# Patient Record
Sex: Female | Born: 1945
Health system: Southern US, Community
[De-identification: ages and names within clinical notes are randomized; demographics above are authoritative.]

## PROBLEM LIST (undated history)

## (undated) DIAGNOSIS — E039 Hypothyroidism, unspecified: Secondary | ICD-10-CM

## (undated) DIAGNOSIS — F419 Anxiety disorder, unspecified: Secondary | ICD-10-CM

## (undated) DIAGNOSIS — K222 Esophageal obstruction: Secondary | ICD-10-CM

## (undated) DIAGNOSIS — K5790 Diverticulosis of intestine, part unspecified, without perforation or abscess without bleeding: Secondary | ICD-10-CM

## (undated) DIAGNOSIS — M810 Age-related osteoporosis without current pathological fracture: Secondary | ICD-10-CM

## (undated) DIAGNOSIS — K635 Polyp of colon: Secondary | ICD-10-CM

## (undated) DIAGNOSIS — H269 Unspecified cataract: Secondary | ICD-10-CM

## (undated) DIAGNOSIS — T7840XA Allergy, unspecified, initial encounter: Secondary | ICD-10-CM

## (undated) DIAGNOSIS — E785 Hyperlipidemia, unspecified: Secondary | ICD-10-CM

## (undated) DIAGNOSIS — M8580 Other specified disorders of bone density and structure, unspecified site: Secondary | ICD-10-CM

## (undated) DIAGNOSIS — K209 Esophagitis, unspecified without bleeding: Secondary | ICD-10-CM

## (undated) DIAGNOSIS — Z9109 Other allergy status, other than to drugs and biological substances: Secondary | ICD-10-CM

## (undated) DIAGNOSIS — G4730 Sleep apnea, unspecified: Secondary | ICD-10-CM

## (undated) DIAGNOSIS — F329 Major depressive disorder, single episode, unspecified: Secondary | ICD-10-CM

## (undated) DIAGNOSIS — K449 Diaphragmatic hernia without obstruction or gangrene: Secondary | ICD-10-CM

## (undated) DIAGNOSIS — E559 Vitamin D deficiency, unspecified: Secondary | ICD-10-CM

## (undated) DIAGNOSIS — K589 Irritable bowel syndrome without diarrhea: Secondary | ICD-10-CM

## (undated) DIAGNOSIS — K219 Gastro-esophageal reflux disease without esophagitis: Secondary | ICD-10-CM

## (undated) DIAGNOSIS — E538 Deficiency of other specified B group vitamins: Secondary | ICD-10-CM

## (undated) DIAGNOSIS — K200 Eosinophilic esophagitis: Secondary | ICD-10-CM

## (undated) DIAGNOSIS — W19XXXA Unspecified fall, initial encounter: Secondary | ICD-10-CM

## (undated) DIAGNOSIS — J45909 Unspecified asthma, uncomplicated: Secondary | ICD-10-CM

## (undated) DIAGNOSIS — C4490 Unspecified malignant neoplasm of skin, unspecified: Secondary | ICD-10-CM

## (undated) DIAGNOSIS — F32A Depression, unspecified: Secondary | ICD-10-CM

## (undated) DIAGNOSIS — N289 Disorder of kidney and ureter, unspecified: Secondary | ICD-10-CM

## (undated) HISTORY — PX: OTHER SURGICAL HISTORY: SHX169

## (undated) HISTORY — DX: Allergy, unspecified, initial encounter: T78.40XA

## (undated) HISTORY — DX: Unspecified fall, initial encounter: W19.XXXA

## (undated) HISTORY — DX: Unspecified malignant neoplasm of skin, unspecified: C44.90

## (undated) HISTORY — DX: Disorder of kidney and ureter, unspecified: N28.9

## (undated) HISTORY — DX: Esophagitis, unspecified: K20.9

## (undated) HISTORY — PX: BARTHOLIN GLAND CYST EXCISION: SHX565

## (undated) HISTORY — DX: Irritable bowel syndrome, unspecified: K58.9

## (undated) HISTORY — DX: Gastro-esophageal reflux disease without esophagitis: K21.9

## (undated) HISTORY — PX: EXPLORATORY LAPAROTOMY: SUR591

## (undated) HISTORY — PX: BUNIONECTOMY: SHX129

## (undated) HISTORY — PX: ABDOMINAL HYSTERECTOMY: SHX81

## (undated) HISTORY — DX: Major depressive disorder, single episode, unspecified: F32.9

## (undated) HISTORY — DX: Polyp of colon: K63.5

## (undated) HISTORY — DX: Depression, unspecified: F32.A

## (undated) HISTORY — DX: Unspecified cataract: H26.9

## (undated) HISTORY — DX: Sleep apnea, unspecified: G47.30

## (undated) HISTORY — DX: Eosinophilic esophagitis: K20.0

## (undated) HISTORY — DX: Deficiency of other specified B group vitamins: E53.8

## (undated) HISTORY — DX: Diverticulosis of intestine, part unspecified, without perforation or abscess without bleeding: K57.90

## (undated) HISTORY — DX: Other specified disorders of bone density and structure, unspecified site: M85.80

## (undated) HISTORY — DX: Anxiety disorder, unspecified: F41.9

## (undated) HISTORY — DX: Esophageal obstruction: K22.2

## (undated) HISTORY — PX: CHOLECYSTECTOMY: SHX55

## (undated) HISTORY — PX: NASAL SEPTUM SURGERY: SHX37

## (undated) HISTORY — DX: Vitamin D deficiency, unspecified: E55.9

## (undated) HISTORY — DX: Esophagitis, unspecified without bleeding: K20.90

## (undated) HISTORY — DX: Age-related osteoporosis without current pathological fracture: M81.0

---

## 1997-10-11 ENCOUNTER — Other Ambulatory Visit: Admission: RE | Admit: 1997-10-11 | Discharge: 1997-10-11 | Payer: Self-pay | Admitting: Gynecology

## 1997-10-31 ENCOUNTER — Encounter: Admission: RE | Admit: 1997-10-31 | Discharge: 1998-01-29 | Payer: Self-pay | Admitting: Gynecology

## 1997-12-25 ENCOUNTER — Emergency Department (HOSPITAL_COMMUNITY): Admission: EM | Admit: 1997-12-25 | Discharge: 1997-12-25 | Payer: Self-pay | Admitting: Emergency Medicine

## 1998-11-06 ENCOUNTER — Other Ambulatory Visit: Admission: RE | Admit: 1998-11-06 | Discharge: 1998-11-06 | Payer: Self-pay | Admitting: Gynecology

## 2000-04-06 ENCOUNTER — Other Ambulatory Visit: Admission: RE | Admit: 2000-04-06 | Discharge: 2000-04-06 | Payer: Self-pay | Admitting: Gynecology

## 2000-05-11 ENCOUNTER — Ambulatory Visit (HOSPITAL_COMMUNITY): Admission: RE | Admit: 2000-05-11 | Discharge: 2000-05-11 | Payer: Self-pay | Admitting: Gastroenterology

## 2000-05-11 ENCOUNTER — Encounter (INDEPENDENT_AMBULATORY_CARE_PROVIDER_SITE_OTHER): Payer: Self-pay | Admitting: Specialist

## 2000-05-28 ENCOUNTER — Encounter: Admission: RE | Admit: 2000-05-28 | Discharge: 2000-05-28 | Payer: Self-pay | Admitting: Gastroenterology

## 2000-05-28 ENCOUNTER — Encounter: Payer: Self-pay | Admitting: Gastroenterology

## 2001-04-15 ENCOUNTER — Other Ambulatory Visit: Admission: RE | Admit: 2001-04-15 | Discharge: 2001-04-15 | Payer: Self-pay | Admitting: Gynecology

## 2001-06-07 ENCOUNTER — Ambulatory Visit (HOSPITAL_BASED_OUTPATIENT_CLINIC_OR_DEPARTMENT_OTHER): Admission: RE | Admit: 2001-06-07 | Discharge: 2001-06-07 | Payer: Self-pay | Admitting: Internal Medicine

## 2001-07-13 ENCOUNTER — Other Ambulatory Visit: Admission: RE | Admit: 2001-07-13 | Discharge: 2001-07-13 | Payer: Self-pay | Admitting: Gynecology

## 2001-07-27 ENCOUNTER — Ambulatory Visit (HOSPITAL_BASED_OUTPATIENT_CLINIC_OR_DEPARTMENT_OTHER): Admission: RE | Admit: 2001-07-27 | Discharge: 2001-07-27 | Payer: Self-pay | Admitting: Internal Medicine

## 2001-09-20 ENCOUNTER — Encounter: Payer: Self-pay | Admitting: Family Medicine

## 2001-09-20 ENCOUNTER — Encounter: Admission: RE | Admit: 2001-09-20 | Discharge: 2001-09-20 | Payer: Self-pay | Admitting: Family Medicine

## 2001-10-11 ENCOUNTER — Ambulatory Visit (HOSPITAL_COMMUNITY): Admission: RE | Admit: 2001-10-11 | Discharge: 2001-10-11 | Payer: Self-pay | Admitting: Gastroenterology

## 2002-04-02 ENCOUNTER — Emergency Department (HOSPITAL_COMMUNITY): Admission: EM | Admit: 2002-04-02 | Discharge: 2002-04-02 | Payer: Self-pay | Admitting: Emergency Medicine

## 2002-12-17 ENCOUNTER — Emergency Department (HOSPITAL_COMMUNITY): Admission: EM | Admit: 2002-12-17 | Discharge: 2002-12-17 | Payer: Self-pay | Admitting: Emergency Medicine

## 2003-08-22 ENCOUNTER — Other Ambulatory Visit: Admission: RE | Admit: 2003-08-22 | Discharge: 2003-08-22 | Payer: Self-pay | Admitting: Gynecology

## 2005-03-02 ENCOUNTER — Other Ambulatory Visit: Admission: RE | Admit: 2005-03-02 | Discharge: 2005-03-02 | Payer: Self-pay | Admitting: Gynecology

## 2006-03-04 ENCOUNTER — Other Ambulatory Visit: Admission: RE | Admit: 2006-03-04 | Discharge: 2006-03-04 | Payer: Self-pay | Admitting: Gynecology

## 2007-01-18 ENCOUNTER — Ambulatory Visit (HOSPITAL_BASED_OUTPATIENT_CLINIC_OR_DEPARTMENT_OTHER): Admission: RE | Admit: 2007-01-18 | Discharge: 2007-01-18 | Payer: Self-pay | Admitting: Cardiovascular Disease

## 2007-01-18 ENCOUNTER — Encounter: Payer: Self-pay | Admitting: Internal Medicine

## 2007-01-22 ENCOUNTER — Ambulatory Visit: Payer: Self-pay | Admitting: Internal Medicine

## 2007-01-26 ENCOUNTER — Encounter (INDEPENDENT_AMBULATORY_CARE_PROVIDER_SITE_OTHER): Payer: Self-pay | Admitting: Orthopedic Surgery

## 2007-01-26 ENCOUNTER — Ambulatory Visit (HOSPITAL_BASED_OUTPATIENT_CLINIC_OR_DEPARTMENT_OTHER): Admission: RE | Admit: 2007-01-26 | Discharge: 2007-01-26 | Payer: Self-pay | Admitting: Orthopedic Surgery

## 2007-03-18 ENCOUNTER — Encounter: Payer: Self-pay | Admitting: Internal Medicine

## 2007-03-31 ENCOUNTER — Other Ambulatory Visit: Admission: RE | Admit: 2007-03-31 | Discharge: 2007-03-31 | Payer: Self-pay | Admitting: Gynecology

## 2007-08-08 ENCOUNTER — Ambulatory Visit: Payer: Self-pay | Admitting: Internal Medicine

## 2007-08-08 DIAGNOSIS — G4730 Sleep apnea, unspecified: Secondary | ICD-10-CM | POA: Insufficient documentation

## 2007-08-14 DIAGNOSIS — J309 Allergic rhinitis, unspecified: Secondary | ICD-10-CM | POA: Insufficient documentation

## 2007-08-14 DIAGNOSIS — I10 Essential (primary) hypertension: Secondary | ICD-10-CM | POA: Insufficient documentation

## 2007-08-14 DIAGNOSIS — J45909 Unspecified asthma, uncomplicated: Secondary | ICD-10-CM | POA: Insufficient documentation

## 2007-08-14 DIAGNOSIS — E785 Hyperlipidemia, unspecified: Secondary | ICD-10-CM | POA: Insufficient documentation

## 2007-08-14 DIAGNOSIS — E039 Hypothyroidism, unspecified: Secondary | ICD-10-CM | POA: Insufficient documentation

## 2007-09-06 ENCOUNTER — Encounter: Payer: Self-pay | Admitting: Internal Medicine

## 2010-06-03 NOTE — Procedures (Signed)
NAME:  Mary Rubio, Mary Rubio         ACCOUNT NO.:  [id number]   MEDICAL RECORD NO.:  [id number]          PATIENT TYPE:  OUT   LOCATION:  SLEEP CENTER                 FACILITY:  [facility]   PHYSICIAN:  Clinton D. Maple Hudson, MD, FCCP, FACPDATE OF BIRTH:  1945-12-01   DATE OF STUDY:                            NOCTURNAL POLYSOMNOGRAM   REFERRING PHYSICIAN:  Vesta Mixer, M.D.   INDICATION FOR STUDY:  Insomnia with sleep apnea.   EPWORTH SLEEPINESS SCORE:  13/24, BMI 26.5, weight 147 pounds, height 62  inches, neck 12.5 inches.   MEDICATIONS:  Home medications charted and reviewed.   SLEEP ARCHITECTURE:  Total sleep time short, 189 minutes with sleep  efficiency 41.6%.  Stage I was 22%, stage II 77%; stages III and REM  were absent.  Sleep latency 118 minutes, awake after sleep onset 148  minutes, arousal index 26 indicating increased sleep fragmentation and  EEG arousal.  She described the night as fragmented with waking too  many times to count.  One-quarter of a Xanax tablet was taken at 2140  hours and another 0.25 of Xanax tablet was taken at 2257 hours.  Sleep  onset briefly around midnight, but sustained sleep was not obtained  until after 1:30 a.m.   RESPIRATORY DATA:  Apnea-hypopnea index (AHI, RDI) 10.4 events per hour.  There were 33 total events including 24 obstructive apneas and 9  hypopneas, nonpositional.  REM/AHI 0.  Because of delayed sleep onset,  it was insufficient time to permit CPAP titration by split protocol on  this study night.   OXYGEN DATA:  Moderately loud snoring with oxygen desaturation to a  nadir of 89%.  Mean oxygen saturation to the study 95.6% on room air.   CARDIAC DATA:  Normal sinus rhythm.   MOVEMENT/PARASOMNIA:  No significant movement disturbance.  Talking was  noted during sleep at two intervals.   IMPRESSIONS/RECOMMENDATIONS:  1. Short total sleep study primarily reflecting delayed sleep onset      with sustained sleep only after 1:30 a.m.  and marked fragmentation      subsequently with frequent brief wakings.  This seems to correspond      to her home sleep pattern and may respond to treatment for      insomnia.  2. Mild obstructive sleep apnea/hypopnea syndrome, apnea/hypopnea      index 10.4 per hour (RDI 25.3 per hour).  Return for continuous      positive airway pressure titration bringing a sedative hypnotic may      be appropriate.  Note that patient has continuous positive airway      pressure at home set at 11 CWP but has not been using it because of      frequent sinus infections attributed to continuous positive airway      pressure.  Suggest sleep medicine      or ear/nose/throat evaluation of this complaint if appropriate.  3. Somniloquy (talking in sleep) noted during two brief episodes of      partial arousal from stage II sleep.      Clinton D. Maple Hudson, MD, FCCP, FACP  Diplomate, Biomedical engineer of Sleep Medicine  Electronically Signed     CDY/MEDQ  D:  01/22/2007 11:27:13  T:  01/22/2007 11:52:43  Job:  [id number]

## 2010-06-03 NOTE — Op Note (Signed)
NAME:  Mary Rubio, Mary Rubio         ACCOUNT NO.:  [id number]   MEDICAL RECORD NO.:  [id number]          PATIENT TYPE:  AMB   LOCATION:  DSC                          FACILITY:  [facility]   PHYSICIAN:  Cindee Salt, M.D.       DATE OF BIRTH:  1945-06-06   DATE OF PROCEDURE:  01/26/2007  DATE OF DISCHARGE:                               OPERATIVE REPORT   PREOPERATIVE DIAGNOSIS:  Degenerative arthritis and mucoid cyst,  interphalangeal joint, left thumb.   POSTOPERATIVE DIAGNOSIS:  Degenerative arthritis and mucoid cyst,  interphalangeal joint, left thumb.   OPERATION:  Excision of mucoid cyst, debridement, interphalangeal joint,  left thumb both dorsally and palmarly.   SURGEON:  Cindee Salt, MD   ASSISTANT:  Carolyne Fiscal, R.N.   ANESTHESIA:  Regional forearm-based IV.   DATE OF OPERATION:   ANESTHESIOLOGIST:  Kaylyn Layer. Michelle Piper, M.D.   HISTORY:  The patient is a 65 year old female with a history of a mass  over the dorsal aspect of her IP joint, left thumb.  This does  transilluminate.  She is admitted for excision and debridement of her  joint.  She is aware of risks and complications including infection,  recurrence, injury to arteries, nerves, tendons, incomplete relief of  symptoms, dystrophy.  In the preoperative area the patient is seen,  antibiotic given, questions again encouraged and answered.  Antibiotic  was given.   PROCEDURE:  The patient was brought to the operating room, where a  forearm-based IV regional anesthetic was carried out without difficulty.  She was prepped using DuraPrep, supine position, with the left arm free.  A curvilinear incision was made over the IP joint of the palm and  carried down through subcutaneous tissue.  A lobulated, fluid-filled  mass was immediately encountered distal to the IP joint.  This was  dissected free, followed down into the joint and sent to pathology.  After excision, the joint was opened.  A synovectomy was performed along  with  debridement of osteophytes.  The wound was irrigated.  The skin was  closed with interrupted 5-0 Vicryl Rapide suture.  She had complained of  mass on the volar aspect.  A volar Brunner-type incision was made,  carried down through subcutaneous tissue, the bleeders  electrocauterized, neurovascular structures identified and protected, a  retractor was placed.  There was a thickening of the volar plate with  osteophyte formation beneath.  This area was minimally debrided.  No  further lesions were identified.  No flexor sheath cyst other than  swelling of the flexor sheath was noted.  The wound was irrigated, skin  closed with interrupted 5-0 Vicryl Rapide sutures.  A sterile  compressive dressing and splint to the thumb applied.  The patient  tolerated the procedure well and was taken to the recovery room for  observation in satisfactory condition.  She will be discharged home to  return to the [facility] [place] in 1 week on [place] [facility].           ______________________________  Cindee Salt, M.D.     GK/MEDQ  D:  01/26/2007  T:  01/26/2007  Job:  [id number]   cc:   Dr. Clarene Duke

## 2010-06-06 NOTE — Procedures (Signed)
[place]  Patient:    Mary Rubio, Mary Rubio               MRN: [id number] Adm. Date:  05/11/00 Attending:  Anselmo Rod, M.D. CC:         Gaetano Hawthorne. Lily Peer, M.D.   Procedure Report  DATE OF BIRTH:  Jul 15, 1945  REFERRING PHYSICIAN:  Gaetano Hawthorne. Lily Peer, M.D.  PROCEDURE PERFORMED:  Colonoscopy with biopsies.  ENDOSCOPIST:  Anselmo Rod, M.D.  INSTRUMENT USED:  Olympus video colonoscope.  INDICATIONS FOR PROCEDURE:  The patient is a 65 year old Caucasian female with history of rectal bleeding and diarrhea, rule out colonic polyps, masses, hemorrhoids, etc.  Rule out inflammatory bowel disease.  PREPROCEDURE PREPARATION:  Informed consent was procured from the patient. The patient was fasted for eight hours prior to the procedure and prepped with a bottle of magnesium citrate and a gallon of NuLytely the night prior to the procedure.  PREPROCEDURE PHYSICAL:  The patient had stable vital signs.  Neck supple. Chest clear to auscultation.  S1, S2 regular.  Abdomen soft with normal abdominal bowel sounds.  DESCRIPTION OF PROCEDURE:  The patient was placed in the left lateral decubitus position and sedated with 60 mcg of fentanyl and 6 mg of Versed intravenously.  Once the patient was adequately sedated and maintained on low-flow oxygen and continuous cardiac monitoring, the Olympus video colonoscope was advanced from the rectum to the terminal ileum without difficulty.  There were small internal and external hemorrhoids appreciated on retroflexion and anal inspection, respectively.  Small ulcerations were seen from 10 to 20 cm.  The "small ulcers" were biopsied for pathology rule out rule out inflammatory bowel disease versus infectious source.  The rest of the colonic mucosa up to the terminal ileum appeared healthy.  IMPRESSION: 1. Small nonbleeding internal and external hemorrhoids. 2. Confluence of small ulcerations at 10  to 20 cm, biopsies done, results    pending.  RECOMMENDATIONS: 1. Await pathology results. 2. Avoid nonsteroidals. 3. Outpatient follow-up within the next two weeks.DD:  05/12/00 TD:  05/12/00 Job: [id number]

## 2010-06-06 NOTE — Op Note (Signed)
NAME:  Mary Rubio, Mary Rubio                   ACCOUNT NO.:  [id number]   MEDICAL RECORD NO.:  [id number]                   PATIENT TYPE:  AMB   LOCATION:  ENDO                                 FACILITY:  [facility]   PHYSICIAN:  Charna Elizabeth, M.D.                   DATE OF BIRTH:  1945-05-26   DATE OF PROCEDURE:  10/11/2001  DATE OF DISCHARGE:                                 OPERATIVE REPORT   PROCEDURE PERFORMED:  Esophagogastroduodenoscopy.   ENDOSCOPIST:  Charna Elizabeth, M.D.   INSTRUMENT USED:  Olympus video panendoscope.   INDICATIONS OF PROCEDURE:  A 65 year old white female with a history of  dysphagia, rule out esophageal stricture.  The patient had a barium swallow  that showed hanging up of a barium tablet in the esophagus therefore  dilatation is planned.   PREPROCEDURE PREPARATION:  Informed consent was procured from the patient.  The patient fasted for eight hours prior to the procedure.   PREPROCEDURE PHYSICAL:  The patient had stable vital signs.  Neck supple.  Chest clear to auscultation.  S1 and S2 regular.  Abdomen soft with normal  bowel sounds.   DESCRIPTION OF PROCEDURE:  The patient was placed in the left lateral  decubitus position and sedated with 80 mg of Demerol and 8 mg of Versed  intravenously.  Once the patient was adequately sedated and maintained on  low-flow oxygen and continuous cardiac monitoring, the Olympus video  panendoscope was advanced through the mouthpiece, over the tongue, into the  esophagus under direct vision.  Initially a larger upper endoscope was used  but as this was not easily passed to the UES there was a 1-mm smaller one  that was used and this passed easily into the proximal esophagus and down to  the GE junction.  A wide open Schatzki's ring was noted at the GE junction  with distal esophagitis around it.  There was a large hiatal hernia seen on  high retroflexion with patchy gastritis throughout the gastric mucosa.  No  ulcers,  erosions, masses or polys were seen.  The proximal small bowel  appeared normal.   IMPRESSION:  1. Widely patent esophagus with wide open Schatzki's ring at the     gastroesophageal junction with distal esophagitis.  2. Large hiatal hernia.  3. Patchy gastritis.  4. Normal proximal small bowel.  5. No ulcers or masses seen.   RECOMMENDATIONS:  1. Continue Prilosec.  2. Carafate slurry 1 g q.i.d. has been called into her pharmacy, [facility] [address].  3.     Soft diet for now.  4. Outpatient followup in the next one week.  Further recommendation made in     followup.  Repeat dilatation if the patient's symptoms are unimproved     with a combination of the Prilosec and the Carafate slurry.  Charna Elizabeth, M.D.    JM/MEDQ  D:  10/11/2001  T:  10/12/2001  Job:  [id number]   cc:   Gaetano Hawthorne. Lily Peer, M.D.  [address]  [place]  [place] [address]  Fax: [phone]

## 2011-01-06 ENCOUNTER — Ambulatory Visit: Payer: Self-pay

## 2011-01-06 DIAGNOSIS — J019 Acute sinusitis, unspecified: Secondary | ICD-10-CM

## 2011-01-23 ENCOUNTER — Ambulatory Visit: Payer: Self-pay

## 2011-01-23 DIAGNOSIS — R070 Pain in throat: Secondary | ICD-10-CM

## 2011-09-10 ENCOUNTER — Ambulatory Visit: Payer: Self-pay | Admitting: Family Medicine

## 2011-09-10 VITALS — BP 128/68 | HR 85 | Temp 98.4°F | Resp 20 | Ht 62.5 in | Wt 135.0 lb

## 2011-09-10 DIAGNOSIS — F329 Major depressive disorder, single episode, unspecified: Secondary | ICD-10-CM

## 2011-09-10 DIAGNOSIS — E039 Hypothyroidism, unspecified: Secondary | ICD-10-CM

## 2011-09-10 DIAGNOSIS — F32A Depression, unspecified: Secondary | ICD-10-CM

## 2011-09-10 LAB — TSH: TSH: 1.222 u[IU]/mL (ref 0.350–4.500)

## 2011-09-10 MED ORDER — FLUOXETINE HCL 40 MG PO CAPS: 40.0000 mg | ORAL_CAPSULE | Freq: Every day | ORAL | Status: DC

## 2011-09-10 MED ORDER — ALPRAZOLAM 0.25 MG PO TABS: 0.2500 mg | ORAL_TABLET | Freq: Two times a day (BID) | ORAL | Status: AC | PRN

## 2011-09-10 NOTE — Patient Instructions (Addendum)
Continue taking your prozac 20mg  for the next 2 or 3 weeks.  Then you may try increasing to 2 pills (40mg ) a day.  I have sent an rx for prozac 40 to your drug store- you may change to this once you finish your current bottle  Call [employer] and/or medicare regarding getting some home care for your dad.  His doctor may be able to help with this.   Exercise!  Take a walk in the sunshine every day Get up and go to bed at regular times, and eat a healthy diet Make some plans with friends (even if you don't feel like it!) Look for volunteer opportunities- you might enjoy caring for animals.
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
## 2011-09-11 ENCOUNTER — Encounter: Payer: Self-pay | Admitting: Family Medicine

## 2011-09-23 ENCOUNTER — Other Ambulatory Visit: Payer: Self-pay | Admitting: Family Medicine

## 2011-09-23 MED ORDER — LEVOTHYROXINE SODIUM 112 MCG PO TABS: 112.0000 ug | ORAL_TABLET | Freq: Every day | ORAL | Status: DC

## 2011-12-08 ENCOUNTER — Encounter: Payer: Self-pay | Admitting: Family Medicine

## 2011-12-08 DIAGNOSIS — J309 Allergic rhinitis, unspecified: Secondary | ICD-10-CM | POA: Insufficient documentation

## 2012-01-20 DIAGNOSIS — Z0271 Encounter for disability determination: Secondary | ICD-10-CM

## 2012-03-15 ENCOUNTER — Other Ambulatory Visit: Payer: Self-pay | Admitting: Gastroenterology

## 2012-03-15 DIAGNOSIS — R109 Unspecified abdominal pain: Secondary | ICD-10-CM

## 2012-03-15 DIAGNOSIS — K219 Gastro-esophageal reflux disease without esophagitis: Secondary | ICD-10-CM

## 2012-03-15 DIAGNOSIS — K449 Diaphragmatic hernia without obstruction or gangrene: Secondary | ICD-10-CM

## 2012-03-23 ENCOUNTER — Telehealth: Payer: Self-pay

## 2012-03-23 NOTE — Telephone Encounter (Signed)
Patient says she had blood work done at dr [employer] office and her thyroid was out of whack, they sent bloodwork to dr Merla Riches and she wants to know if dr Merla Riches is going to call her about this [phone]

## 2012-03-24 ENCOUNTER — Other Ambulatory Visit: Payer: Self-pay

## 2012-03-24 NOTE — Telephone Encounter (Signed)
Have you seen the blood work? I have not.

## 2012-03-25 NOTE — Telephone Encounter (Signed)
Labs are being scanned//the tsh was very close to normal and there is no reason to change treatment until we do a free t4 to see if more meds indicated Also note I haven't seen her in over one year//only visit here 8/13

## 2012-03-26 NOTE — Telephone Encounter (Signed)
Called her to advise and she does have appt scheduled in March.

## 2012-03-31 ENCOUNTER — Other Ambulatory Visit: Payer: Self-pay

## 2012-04-08 ENCOUNTER — Ambulatory Visit
Admission: RE | Admit: 2012-04-08 | Discharge: 2012-04-08 | Disposition: A | Discharge status: Home / Self Care | Payer: BC Managed Care – PPO | Source: Ambulatory Visit | Attending: Gastroenterology | Admitting: Gastroenterology

## 2012-04-08 DIAGNOSIS — R109 Unspecified abdominal pain: Secondary | ICD-10-CM

## 2012-04-08 DIAGNOSIS — K219 Gastro-esophageal reflux disease without esophagitis: Secondary | ICD-10-CM

## 2012-04-08 DIAGNOSIS — K449 Diaphragmatic hernia without obstruction or gangrene: Secondary | ICD-10-CM

## 2012-04-27 ENCOUNTER — Encounter: Payer: Self-pay | Admitting: Internal Medicine

## 2012-04-27 ENCOUNTER — Ambulatory Visit (INDEPENDENT_AMBULATORY_CARE_PROVIDER_SITE_OTHER): Payer: BC Managed Care – PPO | Admitting: Internal Medicine

## 2012-04-27 VITALS — BP 114/66 | HR 80 | Temp 98.5°F | Resp 16 | Ht 62.5 in | Wt 134.8 lb

## 2012-04-27 DIAGNOSIS — E039 Hypothyroidism, unspecified: Secondary | ICD-10-CM

## 2012-04-27 DIAGNOSIS — K219 Gastro-esophageal reflux disease without esophagitis: Secondary | ICD-10-CM

## 2012-04-27 DIAGNOSIS — Z23 Encounter for immunization: Secondary | ICD-10-CM

## 2012-04-27 DIAGNOSIS — Z13820 Encounter for screening for osteoporosis: Secondary | ICD-10-CM

## 2012-04-27 DIAGNOSIS — Z0000 Encounter for general adult medical examination without abnormal findings: Secondary | ICD-10-CM

## 2012-04-27 DIAGNOSIS — E785 Hyperlipidemia, unspecified: Secondary | ICD-10-CM

## 2012-04-27 DIAGNOSIS — K449 Diaphragmatic hernia without obstruction or gangrene: Secondary | ICD-10-CM

## 2012-04-27 DIAGNOSIS — G4700 Insomnia, unspecified: Secondary | ICD-10-CM

## 2012-04-27 LAB — T4, FREE: Free T4: 1.35 ng/dL (ref 0.80–1.80)

## 2012-04-27 LAB — CBC WITH DIFFERENTIAL/PLATELET
Basophils Absolute: 0.1 10*3/uL (ref 0.0–0.1)
Basophils Relative: 1 % (ref 0–1)
Eosinophils Absolute: 0.2 10*3/uL (ref 0.0–0.7)
Eosinophils Relative: 4 % (ref 0–5)
HCT: 41.3 % (ref 36.0–46.0)
Hemoglobin: 14 g/dL (ref 12.0–15.0)
Lymphocytes Relative: 39 % (ref 12–46)
Lymphs Abs: 2 10*3/uL (ref 0.7–4.0)
MCH: 29.5 pg (ref 26.0–34.0)
MCHC: 33.9 g/dL (ref 30.0–36.0)
MCV: 87.1 fL (ref 78.0–100.0)
Monocytes Absolute: 0.4 10*3/uL (ref 0.1–1.0)
Monocytes Relative: 7 % (ref 3–12)
Neutro Abs: 2.5 10*3/uL (ref 1.7–7.7)
Neutrophils Relative %: 49 % (ref 43–77)
Platelets: 199 10*3/uL (ref 150–400)
RBC: 4.74 MIL/uL (ref 3.87–5.11)
RDW: 13 % (ref 11.5–15.5)
WBC: 5 10*3/uL (ref 4.0–10.5)

## 2012-04-27 LAB — LIPID PANEL
Cholesterol: 229 mg/dL — ABNORMAL HIGH (ref 0–200)
HDL: 62 mg/dL (ref >39)
LDL Cholesterol: 146 mg/dL — ABNORMAL HIGH (ref 0–99)
Total CHOL/HDL Ratio: 3.7 Ratio
Triglycerides: 104 mg/dL (ref <150)
VLDL: 21 mg/dL (ref 0–40)

## 2012-04-27 LAB — TSH: TSH: 2.096 u[IU]/mL (ref 0.350–4.500)

## 2012-04-27 LAB — COMPREHENSIVE METABOLIC PANEL
ALT: 17 U/L (ref 0–35)
AST: 16 U/L (ref 0–37)
Albumin: 4.5 g/dL (ref 3.5–5.2)
Alkaline Phosphatase: 63 U/L (ref 39–117)
BUN: 16 mg/dL (ref 6–23)
CO2: 27 mEq/L (ref 19–32)
Calcium: 9.8 mg/dL (ref 8.4–10.5)
Chloride: 107 mEq/L (ref 96–112)
Creat: 0.87 mg/dL (ref 0.50–1.10)
Glucose, Bld: 83 mg/dL (ref 70–99)
Potassium: 4.2 mEq/L (ref 3.5–5.3)
Sodium: 141 mEq/L (ref 135–145)
Total Bilirubin: 0.5 mg/dL (ref 0.3–1.2)
Total Protein: 7.2 g/dL (ref 6.0–8.3)

## 2012-04-27 MED ORDER — ALPRAZOLAM 0.25 MG PO TABS: 0.2500 mg | ORAL_TABLET | Freq: Every evening | ORAL | Status: DC | PRN

## 2012-04-27 NOTE — Progress Notes (Signed)
  Subjective:    Patient ID: Mary Rubio, female    DOB: 1946-01-12, 67 y.o.   MRN: [id number]  HPI    Review of Systems  Constitutional: Positive for appetite change and fatigue.  HENT: Positive for tinnitus.   Eyes: Positive for photophobia.  Respiratory: Positive for chest tightness.   Cardiovascular: Negative.   Gastrointestinal: Negative.   Endocrine: Negative.   Genitourinary: Positive for frequency.  Musculoskeletal: Positive for arthralgias.  Skin: Negative.   Allergic/Immunologic: Negative.   Neurological: Negative.   Hematological: Negative.   Psychiatric/Behavioral: Positive for sleep disturbance.       Objective:   Physical Exam        Assessment & Plan:

## 2012-04-27 NOTE — Progress Notes (Signed)
  Subjective:    Patient ID: Mary Rubio, female    DOB: [birth year], 67 y.o.   MRN: [id number]  HPI I dictated a lengthy note including full history review of systems and physical examination which was essentially normal at the end of this the at the connection was disconnected by a mechanical error or a electrical error and all the information was lost   Review of Systems See nursing assistant    Objective:   Physical Exam Suffice it to say that her exam is normal and she suffers most from being the caretaker for her father with advancing Alzheimer's disease in the aftermath of losing her mother 11 months ago to lung cancer       Assessment & Plan:  Routine general medical examination at a health care facility - Plan: CBC with Differential, Comprehensive metabolic panel, Pneumococcal polysaccharide vaccine 23-valent greater than or equal to 2yo subcutaneous/IM  Unspecified hypothyroidism - Plan: TSH, T4, Free  Insomnia, unspecified  Other and unspecified hyperlipidemia - Plan: CBC with Differential, Lipid panel  Severe gastric reflux disease secondary to hiatal hernia-will refer to [facility] for consideration of surgery  Hyperhidrosis of uncertain etiology  Meds ordered this encounter  Medications  . omeprazole-sodium bicarbonate (ZEGERID) 40-1100 MG per capsule    Sig: Take 1 capsule by mouth daily before breakfast.  . fluticasone (VERAMYST) 27.5 MCG/SPRAY nasal spray    Sig: Place 2 sprays into the nose daily.  Marland Kitchen ALPRAZolam (XANAX) 0.25 MG tablet    Sig: Take 1 tablet (0.25 mg total) by mouth at bedtime as needed for sleep.    Dispense:  30 tablet    Refill:  5   continue other meds --including Zegerid 40 mg and Zantac 300 bedtime Flu shot in the fall Call with lab results DEXA

## 2012-05-02 ENCOUNTER — Encounter: Payer: Self-pay | Admitting: Internal Medicine

## 2012-05-12 ENCOUNTER — Encounter (HOSPITAL_COMMUNITY): Payer: Self-pay | Admitting: Emergency Medicine

## 2012-05-12 ENCOUNTER — Observation Stay (HOSPITAL_COMMUNITY)
Admission: EM | Admit: 2012-05-12 | Discharge: 2012-05-13 | Disposition: A | Discharge status: Home / Self Care | Payer: BC Managed Care – PPO | Attending: Cardiovascular Disease | Admitting: Cardiovascular Disease

## 2012-05-12 ENCOUNTER — Emergency Department (HOSPITAL_COMMUNITY): Payer: BC Managed Care – PPO

## 2012-05-12 DIAGNOSIS — I498 Other specified cardiac arrhythmias: Secondary | ICD-10-CM | POA: Insufficient documentation

## 2012-05-12 DIAGNOSIS — F329 Major depressive disorder, single episode, unspecified: Secondary | ICD-10-CM | POA: Insufficient documentation

## 2012-05-12 DIAGNOSIS — R0602 Shortness of breath: Secondary | ICD-10-CM | POA: Insufficient documentation

## 2012-05-12 DIAGNOSIS — R072 Precordial pain: Secondary | ICD-10-CM | POA: Diagnosis present

## 2012-05-12 DIAGNOSIS — E876 Hypokalemia: Secondary | ICD-10-CM | POA: Diagnosis present

## 2012-05-12 DIAGNOSIS — R079 Chest pain, unspecified: Secondary | ICD-10-CM

## 2012-05-12 DIAGNOSIS — L74519 Primary focal hyperhidrosis, unspecified: Secondary | ICD-10-CM | POA: Insufficient documentation

## 2012-05-12 DIAGNOSIS — I200 Unstable angina: Secondary | ICD-10-CM

## 2012-05-12 DIAGNOSIS — F411 Generalized anxiety disorder: Secondary | ICD-10-CM | POA: Insufficient documentation

## 2012-05-12 DIAGNOSIS — F3289 Other specified depressive episodes: Secondary | ICD-10-CM | POA: Insufficient documentation

## 2012-05-12 DIAGNOSIS — R7309 Other abnormal glucose: Secondary | ICD-10-CM | POA: Insufficient documentation

## 2012-05-12 DIAGNOSIS — Z8249 Family history of ischemic heart disease and other diseases of the circulatory system: Secondary | ICD-10-CM | POA: Insufficient documentation

## 2012-05-12 DIAGNOSIS — R61 Generalized hyperhidrosis: Secondary | ICD-10-CM | POA: Insufficient documentation

## 2012-05-12 DIAGNOSIS — E785 Hyperlipidemia, unspecified: Secondary | ICD-10-CM | POA: Insufficient documentation

## 2012-05-12 DIAGNOSIS — I10 Essential (primary) hypertension: Secondary | ICD-10-CM | POA: Diagnosis present

## 2012-05-12 DIAGNOSIS — R0789 Other chest pain: Principal | ICD-10-CM | POA: Insufficient documentation

## 2012-05-12 DIAGNOSIS — J45909 Unspecified asthma, uncomplicated: Secondary | ICD-10-CM | POA: Insufficient documentation

## 2012-05-12 HISTORY — DX: Hypothyroidism, unspecified: E03.9

## 2012-05-12 HISTORY — DX: Other allergy status, other than to drugs and biological substances: Z91.09

## 2012-05-12 HISTORY — DX: Hyperlipidemia, unspecified: E78.5

## 2012-05-12 HISTORY — DX: Diaphragmatic hernia without obstruction or gangrene: K44.9

## 2012-05-12 LAB — BASIC METABOLIC PANEL
BUN: 15 mg/dL (ref 6–23)
CO2: 20 mEq/L (ref 19–32)
Calcium: 9.4 mg/dL (ref 8.4–10.5)
Chloride: 104 mEq/L (ref 96–112)
Creatinine, Ser: 0.77 mg/dL (ref 0.50–1.10)
GFR calc Af Amer: >90 mL/min (ref >90)
GFR calc non Af Amer: 87 mL/min — ABNORMAL LOW (ref >90)
Glucose, Bld: 129 mg/dL — ABNORMAL HIGH (ref 70–99)
Potassium: 3.4 mEq/L — ABNORMAL LOW (ref 3.5–5.1)
Sodium: 140 mEq/L (ref 135–145)

## 2012-05-12 LAB — CBC WITH DIFFERENTIAL/PLATELET
Basophils Absolute: 0 10*3/uL (ref 0.0–0.1)
Basophils Relative: 1 % (ref 0–1)
Eosinophils Absolute: 0.1 10*3/uL (ref 0.0–0.7)
Eosinophils Relative: 2 % (ref 0–5)
HCT: 39.8 % (ref 36.0–46.0)
Hemoglobin: 14.2 g/dL (ref 12.0–15.0)
Lymphocytes Relative: 29 % (ref 12–46)
Lymphs Abs: 1.9 10*3/uL (ref 0.7–4.0)
MCH: 31.5 pg (ref 26.0–34.0)
MCHC: 35.7 g/dL (ref 30.0–36.0)
MCV: 88.2 fL (ref 78.0–100.0)
Monocytes Absolute: 0.4 10*3/uL (ref 0.1–1.0)
Monocytes Relative: 6 % (ref 3–12)
Neutro Abs: 4.1 10*3/uL (ref 1.7–7.7)
Neutrophils Relative %: 63 % (ref 43–77)
Platelets: 198 10*3/uL (ref 150–400)
RBC: 4.51 MIL/uL (ref 3.87–5.11)
RDW: 12.2 % (ref 11.5–15.5)
WBC: 6.5 10*3/uL (ref 4.0–10.5)

## 2012-05-12 LAB — PROTIME-INR
INR: 0.87 (ref 0.00–1.49)
Prothrombin Time: 11.8 seconds (ref 11.6–15.2)

## 2012-05-12 LAB — HEMOGLOBIN A1C
Hgb A1c MFr Bld: 5.5 % (ref <5.7)
Mean Plasma Glucose: 111 mg/dL (ref <117)

## 2012-05-12 LAB — HEPARIN LEVEL (UNFRACTIONATED): Heparin Unfractionated: 0.6 IU/mL (ref 0.30–0.70)

## 2012-05-12 LAB — TROPONIN I
Troponin I: <0.3 ng/mL (ref <0.30)
Troponin I: <0.3 ng/mL (ref <0.30)

## 2012-05-12 LAB — D-DIMER, QUANTITATIVE: D-Dimer, Quant: 0.61 ug/mL-FEU — ABNORMAL HIGH (ref 0.00–0.48)

## 2012-05-12 LAB — POCT I-STAT TROPONIN I: Troponin i, poc: 0.01 ng/mL (ref 0.00–0.08)

## 2012-05-12 MED ORDER — IPRATROPIUM BROMIDE 0.02 % IN SOLN
0.5000 mg | Freq: Once | RESPIRATORY_TRACT | Status: DC
  Filled 2012-05-12: qty 2.5

## 2012-05-12 MED ORDER — SODIUM CHLORIDE 0.9 % IJ SOLN: 3.0000 mL | INTRAMUSCULAR | Status: DC | PRN

## 2012-05-12 MED ORDER — HEPARIN (PORCINE) IN NACL 100-0.45 UNIT/ML-% IJ SOLN
12.0000 [IU]/kg/h | INTRAMUSCULAR | Status: DC
  Administered 2012-05-12: 12 [IU]/kg/h via INTRAVENOUS
  Filled 2012-05-12 (×4): qty 250

## 2012-05-12 MED ORDER — OLOPATADINE HCL 0.2 % OP SOLN: 1.0000 [drp] | Freq: Every day | OPHTHALMIC | Status: DC

## 2012-05-12 MED ORDER — OLOPATADINE HCL 0.1 % OP SOLN
1.0000 [drp] | Freq: Two times a day (BID) | OPHTHALMIC | Status: DC
  Administered 2012-05-13: 1 [drp] via OPHTHALMIC
  Filled 2012-05-12: qty 5

## 2012-05-12 MED ORDER — ALPRAZOLAM 0.25 MG PO TABS
0.2500 mg | ORAL_TABLET | Freq: Every evening | ORAL | Status: DC | PRN
  Administered 2012-05-13: 0.25 mg via ORAL
  Filled 2012-05-12: qty 1

## 2012-05-12 MED ORDER — HEPARIN BOLUS VIA INFUSION
3500.0000 [IU] | Freq: Once | INTRAVENOUS | Status: AC
  Administered 2012-05-12: 3500 [IU] via INTRAVENOUS

## 2012-05-12 MED ORDER — SODIUM CHLORIDE 0.9 % IV SOLN
1.0000 mL/kg/h | INTRAVENOUS | Status: DC
  Administered 2012-05-13: 1 mL/kg/h via INTRAVENOUS

## 2012-05-12 MED ORDER — ALBUTEROL (5 MG/ML) CONTINUOUS INHALATION SOLN
10.0000 mg/h | INHALATION_SOLUTION | RESPIRATORY_TRACT | Status: DC
  Filled 2012-05-12: qty 20

## 2012-05-12 MED ORDER — ASPIRIN EC 81 MG PO TBEC
81.0000 mg | DELAYED_RELEASE_TABLET | Freq: Every day | ORAL | Status: DC
  Filled 2012-05-12: qty 1

## 2012-05-12 MED ORDER — ASPIRIN 81 MG PO CHEW
324.0000 mg | CHEWABLE_TABLET | ORAL | Status: AC
  Administered 2012-05-13: 324 mg via ORAL
  Filled 2012-05-12: qty 4

## 2012-05-12 MED ORDER — FLUTICASONE PROPIONATE 50 MCG/ACT NA SUSP
2.0000 | Freq: Every day | NASAL | Status: DC
  Administered 2012-05-13: 2 via NASAL
  Filled 2012-05-12: qty 16

## 2012-05-12 MED ORDER — SODIUM CHLORIDE 0.9 % IJ SOLN: 3.0000 mL | Freq: Two times a day (BID) | INTRAMUSCULAR | Status: DC

## 2012-05-12 MED ORDER — PANTOPRAZOLE SODIUM 40 MG PO TBEC
80.0000 mg | DELAYED_RELEASE_TABLET | Freq: Every day | ORAL | Status: DC
  Administered 2012-05-13: 80 mg via ORAL
  Filled 2012-05-12: qty 2

## 2012-05-12 MED ORDER — FAMOTIDINE 20 MG PO TABS
20.0000 mg | ORAL_TABLET | Freq: Once | ORAL | Status: AC
  Administered 2012-05-12: 20 mg via ORAL
  Filled 2012-05-12: qty 1

## 2012-05-12 MED ORDER — POTASSIUM CHLORIDE CRYS ER 20 MEQ PO TBCR
40.0000 meq | EXTENDED_RELEASE_TABLET | Freq: Once | ORAL | Status: AC
  Administered 2012-05-12: 40 meq via ORAL
  Filled 2012-05-12: qty 2

## 2012-05-12 MED ORDER — NITROGLYCERIN 0.4 MG SL SUBL
0.4000 mg | SUBLINGUAL_TABLET | SUBLINGUAL | Status: DC | PRN
  Administered 2012-05-12: 0.4 mg via SUBLINGUAL
  Filled 2012-05-12: qty 25

## 2012-05-12 MED ORDER — ACETAMINOPHEN 325 MG PO TABS
650.0000 mg | ORAL_TABLET | ORAL | Status: DC | PRN
  Administered 2012-05-13 (×2): 650 mg via ORAL
  Filled 2012-05-12 (×2): qty 2

## 2012-05-12 MED ORDER — FLUTICASONE FUROATE 27.5 MCG/SPRAY NA SUSP: 2.0000 | Freq: Every day | NASAL | Status: DC

## 2012-05-12 MED ORDER — ASPIRIN 81 MG PO CHEW
324.0000 mg | CHEWABLE_TABLET | Freq: Once | ORAL | Status: AC
  Administered 2012-05-12: 324 mg via ORAL
  Filled 2012-05-12: qty 4

## 2012-05-12 MED ORDER — SODIUM CHLORIDE 0.9 % IV SOLN: 250.0000 mL | INTRAVENOUS | Status: DC | PRN

## 2012-05-12 MED ORDER — DIAZEPAM 5 MG PO TABS
5.0000 mg | ORAL_TABLET | ORAL | Status: AC
  Administered 2012-05-13: 5 mg via ORAL
  Filled 2012-05-12: qty 1

## 2012-05-12 MED ORDER — NITROGLYCERIN 0.4 MG SL SUBL: 0.4000 mg | SUBLINGUAL_TABLET | SUBLINGUAL | Status: DC | PRN

## 2012-05-12 MED ORDER — ONDANSETRON HCL 4 MG/2ML IJ SOLN: 4.0000 mg | Freq: Four times a day (QID) | INTRAMUSCULAR | Status: DC | PRN

## 2012-05-12 MED ORDER — LEVOTHYROXINE SODIUM 112 MCG PO TABS
112.0000 ug | ORAL_TABLET | Freq: Every day | ORAL | Status: DC
  Filled 2012-05-12 (×2): qty 1

## 2012-05-12 NOTE — ED Provider Notes (Signed)
Medical screening examination/treatment/procedure(s) were conducted as a shared visit with non-physician practitioner(s) and myself.  I personally evaluated the patient during the encounter.  66yF with CP and sob. May be some component of bronchospasm with wheezing and appearance of CXR, but multiple symptoms (chest heaviness/pain, improved with nitroglycerin, diaphoresis, etc) concerning for possible cardiac etiology/unstable angina. No hx of CAD but family hx and hx of HLD. Reports has never had stress testing or formal cardiology evaluation. On my exam pt is in NAD. Lungs clear and no increased WOB. Heart mild tachy but regular. Lower extremities symmetric as compared to each other. No calf tenderness. Negative Homan's. No palpable cords. EKG with mild ST depression inferiorly which is new from previous. Initial trop normal. Pt reports pain improved with nitro, but still vague discomfort.  High enough concern for unstable angina that heparin ordered. Will discuss with cardiology. Admit.     Raeford Razor, MD 05/12/12 1220

## 2012-05-12 NOTE — Progress Notes (Signed)
D-dimer reviewed - marginally elevated. Per discussion with Dr. Excell Seltzer, hold off on CTA for now pending cardiac cath in AM. If this is unrevealing, then we may proceed. Continue IV heparin for now and plan as outlined in original note. Dayna Dunn PA-C

## 2012-05-12 NOTE — ED Notes (Signed)
Patient transported to X-ray 

## 2012-05-12 NOTE — ED Notes (Signed)
Place nebulization of albuterol on hold for now per EDMD

## 2012-05-12 NOTE — ED Notes (Addendum)
Pt here via ems for c/o sob that would not get better no know Hx of Asthma given Albuterol 5mg  in route neb

## 2012-05-12 NOTE — ED Notes (Signed)
RN request to obtain blood with IV start

## 2012-05-12 NOTE — Progress Notes (Signed)
ANTICOAGULATION CONSULT NOTE - Initial Consult  Pharmacy Consult for IV Heparin  Indication: ACS/STEMI  Allergies  Allergen Reactions  . Amoxicillin-Pot Clavulanate Nausea And Vomiting  . Codeine   . Latex Other (See Comments)    Canker sores in mouth  . Meperidine Hcl Other (See Comments)    Does not remember   . Pentazocine Lactate   . Simvastatin Other (See Comments)    Muscle aches   . Morphine Rash    Patient Measurements:    Vital Signs: Temp: 98.9 F (37.2 C) (04/24 1034) Temp src: Oral (04/24 1034) BP: 138/119 mmHg (04/24 1034) Pulse Rate: 116 (04/24 1034)  Labs:  Recent Labs  05/12/12 1040  HGB 14.2  HCT 39.8  PLT 198  CREATININE 0.77    The CrCl is unknown because both a height and weight (above a minimum accepted value) are required for this calculation.   Medical History: Past Medical History  Diagnosis Date  . Allergy   . Depression   . GERD (gastroesophageal reflux disease)   . Thyroid disease   . Anxiety     Medications:   (Not in a hospital admission) Scheduled:  . [COMPLETED] aspirin  324 mg Oral Once  . ipratropium  0.5 mg Nebulization Once   Infusions:  . [EXPIRED] albuterol     PRN: nitroGLYCERIN  Assessment:  67 yo F with presents to ER with chest pain/SOB, starting IV heparin for ACS  Weight = 61.3 kg, confirmed with patient on 4/24  Scr is WNL, CrCl 71 ml/min   Baseline CBC wnl, will order stat PT/INR  Goal of Therapy:  Heparin level 0.3-0.7 units/ml Monitor platelets by anticoagulation protocol: Yes   Plan:  1.) STAT baseline PT/INR, once drawn can start heparin gtt 2.) Heparin 3500 unit bolus x 1, Then start heparin 750 units/hr ( 7.5 ml/hr) 3.) Heparin level at steady state 4.) Daily CBC and Heparin level 5.) will f/u with cardiology consult   Mary Rubio, Loma Messing PharmD Pager #: [phone] 12:33 PM 05/12/2012

## 2012-05-12 NOTE — H&P (Signed)
History and Physical  Patient ID: Mary Rubio MRN: [id number], DOB: 31-Dec-1945 Date of Encounter: 05/12/2012, 1:32 PM Primary Physician: Tonye Pearson, MD Primary Cardiologist: New to [place]  Chief Complaint: chest pain, SOB, hoarseness, diaphoresis Reason for Consult: chest pain, tachycardia  HPI: Mary Rubio is a 67 y/o F with no prior cadiac history but HL, significant family hx CAD, environmental allergies, severe GERD, and hypothyroidism (recent normal TFTs) who presented to [facility] today with the above complaints. On Sunday 4/19 through Tues 4/21, she developed fairly constant chest pressure associated with continuous diaphoresis. She has been evaluated for her hyperhidrosis in the past few years without obvious cause but this was worse than usual. Laying on the left side made her chest pain worse. She was also having more significant acid reflux than usual with a gripping epigastric pain. Nothing made the pain acutely better, but she did feel back to normal yesterday. She went for her scheduled allergy shots without complication. This morning she woke up around 6am and noticed she was more hoarse than usual, with a sensation of throat tightness, difficulty breathing, and wheezing. She has had a prior similar episode that was relieved by steroids, so she got ready to go to urgent care. However, around 7:30am she developed focal substernal chest pressure (like a "thumb pressing hard") radiating slightly to the left chest/axilla associated with lightheadedness. This worsened when she walked to go to the bathroom so she called 911. Upon arrival HR by EMS was reportedly 138 (142 sinus tach on arrival here), BP 139/119 with normal O2 sat. She received a neb en route with improvement in her hoarseness and wheezing. The chest pain persisted in the ED so she received a NTG with relief. This recurred once and she received one more dose within the last hour. She describes the relief as  a sense of her throat opening up. 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 She does report brief return of chest pressure with inspiration. Her family notes that over the last year she has complained of exertional dyspnea and chest tightness with exertion and bending over, particularly with stairs.  Currently BP is 138/79, HR still sinus tach 103, normal O2 sat. CXR notable for question of reactive airway disease. EKG showing sinus tach with nonspecific ST-T changes, minimal ST depression inferiorly. Labs with neg troponin x 1, glu 129, normal CBC, K 3.4. She denies personal/family history of blood clot, recent surgery/travel/bedrest, LEE, orthopnea, syncope, weight changes, GI bleeding, or fevers.   Past Medical History  Diagnosis Date  . Environmental allergies     Weekly allergy shots  . Depression   . GERD (gastroesophageal reflux disease)   . Hypothyroidism   . Anxiety   . Hyperlipidemia   . Hiatal hernia      Most Recent Cardiac Studies: None   Surgical History:  Past Surgical History  Procedure Laterality Date  . Abdominal hysterectomy    . Cholecystectomy    . Bunionectomy    . Thumb surgery      for cyst  . Exploratory laparotomy      endometriosis  . Bartholin gland cyst excision    . Facial cyst      excision     Home Meds: Prior to Admission medications   Medication Sig Start Date End Date Taking? Authorizing Provider  ALPRAZolam (XANAX) 0.25 MG tablet Take 1 tablet (0.25 mg total)  by mouth at bedtime as needed for sleep. 04/27/12  Yes Tonye Pearson, MD  fluticasone (VERAMYST) 27.5 MCG/SPRAY nasal spray Place 2 sprays into the nose daily.   Yes Historical Provider, MD  levothyroxine (SYNTHROID, LEVOTHROID) 112 MCG tablet Take 1 tablet (112 mcg total) by mouth daily. 09/23/11  Yes Ryan M Dunn, PA-C  Olopatadine HCl (PATADAY OP) Apply 1 drop to eye daily.   Yes Historical Provider, MD  omeprazole-sodium  bicarbonate (ZEGERID) 40-1100 MG per capsule Take 1 capsule by mouth daily before breakfast.   Yes Historical Provider, MD    Allergies:  Allergies  Allergen Reactions  . Amoxicillin-Pot Clavulanate Nausea And Vomiting  . Crestor (Rosuvastatin)     Myalgias  . Latex Other (See Comments)    Canker sores in mouth (discovered by dentist)  . Meperidine Hcl Other (See Comments)    Does not remember   . Morphine And Related Itching    Has tolerated small doses of hydrocodone  . Pentazocine Lactate Nausea And Vomiting    Sick on stomach  . Simvastatin Other (See Comments)    Muscle aches   . Codeine Rash    Has tolerated small doses of hydrocodone  . Morphine Rash    History   Social History  . Marital Status: Single    Spouse Name: N/A    Number of Children: N/A  . Years of Education: college   Occupational History  . caregiver    Social History Main Topics  . Smoking status: Never Smoker   . Smokeless tobacco: Not on file  . Alcohol Use: Yes     Comment: 1x/week or less  . Drug Use: No  . Sexually Active: No   Other Topics Concern  . Not on file   Social History Narrative  . No narrative on file     Family History  Problem Relation Age of Onset  . Cancer Mother   . Hypertension Mother   . Cancer Father   . Dementia Father   . Heart disease Sister   . Thyroid disease Daughter   . Heart disease Maternal Grandfather   . Heart disease Paternal Grandmother   . Thyroid disease Paternal Grandfather   . Arrhythmia Father   . CAD Sister     MI/stent age 68, CABG age 26  . CAD      Several relatives on dad's side have had MIs/CABG    Review of Systems: General: negative for chills, fever, night sweats Cardiovascular: see above Dermatological: negative for rash Respiratory: negative for cough. This time of year is bad for her allergies. Urologic: negative for hematuria Abdominal: negative for nausea, vomiting, diarrhea, bright red blood per rectum, melena, or  hematemesis Neurologic: negative for visual changes, syncope All other systems reviewed and are otherwise negative except as noted above.  Labs:   Lab Results  Component Value Date   WBC 6.5 05/12/2012   HGB 14.2 05/12/2012   HCT 39.8 05/12/2012   MCV 88.2 05/12/2012   PLT 198 05/12/2012    Recent Labs Lab 05/12/12 1040  NA 140  K 3.4*  CL 104  CO2 20  BUN 15  CREATININE 0.77  CALCIUM 9.4  GLUCOSE 129*    Lab Results  Component Value Date   CHOL 229* 04/27/2012   HDL 62 04/27/2012   LDLCALC 045* 04/27/2012   TRIG 104 04/27/2012    Radiology/Studies:  Dg Chest 2 View 05/12/2012  *RADIOLOGY REPORT*  Clinical Data: Shortness of breath.  CHEST - 2 VIEW  Comparison: No priors.  Findings: Lungs are mildly hyperexpanded.  There is peribronchial cuffing.  No acute consolidative airspace disease.  No pleural effusions.  Pulmonary vasculature and the cardiomediastinal silhouette are within normal limits.  Surgical clips project over the right upper quadrant of the abdomen, likely from prior cholecystectomy.  IMPRESSION: 1.  Mild hyperexpansion with diffuse peribronchial cuffing. Findings are favored to reflect reactive airway disease.   Original Report Authenticated By: Trudie Reed, M.D.     EKG:  First: sinus tach 109bpm, nonspecific ST-T changes, minimal ST depression inferiorly First: sinus tach 142pm, nonspecific ST-T changes, minimal ST depression inferiorly   Physical Exam: Blood pressure 138/79, pulse 106, temperature 98.9 F (37.2 C), temperature source Oral, resp. rate 23, weight 135 lb 2.3 oz (61.3 kg), SpO2 100.00%. General: Well developed, well nourished thin WF in no acute distress. Head: Normocephalic, atraumatic, sclera non-icteric, no xanthomas, nares are without discharge.  Neck: JVD not elevated. No carotid bruits. Lungs: Clear bilaterally to auscultation without wheezes, rales, or rhonchi. Breathing is unlabored. Heart: Reg rhythm, tachycardic with S1 S2. No murmurs,  rubs, or gallops appreciated. Abdomen: Soft, non-tender, non-distended with normoactive bowel sounds. No hepatomegaly. No rebound/guarding. No obvious abdominal masses. Msk:  Strength and tone appear normal for age. Extremities: No clubbing or cyanosis. No edema.  Distal pedal pulses are 2+ and equal bilaterally. Neuro: Alert and oriented X 3. No focal deficit. No facial asymmetry. Moves all extremities spontaneously. Psych:  Responds to questions appropriately with a normal affect.    ASSESSMENT AND PLAN:  1. Chest pain with diaphoresis/tachycardia with typical/atypical features - some symptoms consistent with reactive airway/asthma-type picture but other symptoms concerning for [place]. Given history of untreated HL, significant family history of CAD, hyperglycemia, and exertional symptoms, would recommend cardiac cath to exclude CAD. Will also check d-dimer. She has no other risk factors for PE, is not hypoxic, and has no evidence of DVT on exam.  If positive, will need CTA. Watch HR. Add ASA, statin. Continue heparin as started in ED while enzymes are cycled. Avoid BB given recent wheezing. 2. Reactive airway disease - likely exacerbated by environmental allergies. Improved after one neb treatment. Follow. Unclear if lung status triggered tachycardia (existed before neb). 3. H/o severe GERD with hiatal hernia - continue PPI. She has evaluation at [facility] pending for discussion of further treatment/procedures. If noncardiac/nonpulmonary cause is found for CP, could consider sooner GI eval. 4. Hyperlipidemia, not currently treated (previously intolerant to Zocor & Crestor) - will try low dose lipitor. 5. Hyperglycemia - check A1C. 6. Hypokalemia - may be related to neb. Repleted in ED. 7. Hypothyroidism - euthyroid with recent normal labs.  8. Depression/anxiety - she reports significant caregiver stress. Will request social work consultation for possible resources.  Signed, Dayna Dunn  PA-C 05/12/2012, 1:32 PM  Patient seen, examined. Available data reviewed. Agree with findings, assessment, and plan as outlined by Ronie Spies, PA-C. Pt presents with resting chest pain and dyspnea concerning for unstable angina. Exam reveals a pleasant woman in NAD, lungs are clear, heart is tachycardiac without murmur or gallop. There is no peripheral edema. Labs, EKG's, and radiographic reviewed. History is difficult as she has typical and atypical symptoms. Over the past year she clearly admits to exertional chest tightness and dyspnea with climbing stairs or other physical exertion. Her pain was relieved with NTG today. Considering her untreated hyperlipidemia and strong family hx of premature CAD (sister MI and ultimate CABG  in her 30's), I think cardiac cath and possible PCI is clearly indicated. Will continue IV heparin, ASA. No statin because of intolerance, no beta-blocker because of severe allergies, wheezing). I have reviewed risks, indication, and alternatives to cath and possible PCI. She understands and agrees to proceed. Will admit to [facility] and plan cath in AM.  Tonny Bollman, M.D. 05/12/2012 2:48 PM

## 2012-05-12 NOTE — ED Notes (Signed)
ZOX:[id number]<AV> Expected date:<BR> Expected time:<BR> Means of arrival:Ambulance<BR> Comments:<BR> SOB

## 2012-05-12 NOTE — Progress Notes (Signed)
ANTICOAGULATION CONSULT NOTE - Initial Consult  Pharmacy Consult for IV Heparin  Indication: ACS/STEMI  Allergies  Allergen Reactions  . Amoxicillin-Pot Clavulanate Nausea And Vomiting  . Crestor (Rosuvastatin)     Myalgias  . Latex Other (See Comments)    Canker sores in mouth (discovered by dentist)  . Meperidine Hcl Other (See Comments)    Does not remember   . Morphine And Related Itching    Has tolerated small doses of hydrocodone  . Pentazocine Lactate Nausea And Vomiting    Sick on stomach  . Simvastatin Other (See Comments)    Muscle aches   . Codeine Rash    Has tolerated small doses of hydrocodone  . Morphine Rash    Patient Measurements: Weight: 135 lb 2.3 oz (61.3 kg)  Vital Signs: Temp: 98.9 F (37.2 C) (04/24 1034) Temp src: Oral (04/24 1034) BP: 109/60 mmHg (04/24 1618) Pulse Rate: 100 (04/24 1618)  Labs:  Recent Labs  05/12/12 1040 05/12/12 1241 05/12/12 1920  HGB 14.2  --   --   HCT 39.8  --   --   PLT 198  --   --   LABPROT  --  11.8  --   INR  --  0.87  --   HEPARINUNFRC  --   --  0.60  CREATININE 0.77  --   --     The CrCl is unknown because both a height and weight (above a minimum accepted value) are required for this calculation.   Medical History: Past Medical History  Diagnosis Date  . Environmental allergies     Weekly allergy shots  . Depression   . GERD (gastroesophageal reflux disease)   . Hypothyroidism   . Anxiety   . Hyperlipidemia   . Hiatal hernia     Medications:   (Not in a hospital admission) Scheduled:  . [COMPLETED] aspirin  324 mg Oral Once  . [COMPLETED] famotidine  20 mg Oral Once  . ipratropium  0.5 mg Nebulization Once  . [COMPLETED] potassium chloride  40 mEq Oral Once   Infusions:  . [EXPIRED] albuterol    . heparin 12 Units/kg/hr (05/12/12 1259)  . [COMPLETED] heparin 3,500 Units (05/12/12 1259)   PRN: nitroGLYCERIN  Assessment:  67 yo F with presents to ER with chest pain/SOB, starting  IV heparin for ACS.  Planning tx to [facility] for PCI.  Baseline PT/INR wnl.  First heparin level within target range.  Goal of Therapy:  Heparin level 0.3-0.7 units/ml Monitor platelets by anticoagulation protocol: Yes   Plan:   Cont heparin infusion at 750 units/hr.  Repeat heparin level in 6 hrs.  Daily CBC and Heparin level.  Charolotte Eke, PharmD, pager [phone]. 05/12/2012,8:01 PM.

## 2012-05-12 NOTE — ED Notes (Signed)
Pt

## 2012-05-12 NOTE — ED Provider Notes (Signed)
History     CSN: [id number]  Arrival date & time 05/12/12  0932   First MD Initiated Contact with Patient 05/12/12 1004      Chief Complaint  Patient presents with  . Shortness of Breath    (Consider location/radiation/quality/duration/timing/severity/associated sxs/prior treatment) HPI Comments: Patient is a 67 year old female with a past medical history of asthma, GERD, and hiatal hernia who presents with SOB that acutely worsened prior to arrival. Patient reports symptoms started suddenly and progressively worsened. Patient reports having chest pressure and heaviness that started 4 days ago. She reports laying on the couch when symptoms started. She reports associated diaphoresis. She thought the symptoms were her hiatal hernia. Her symptoms waxed and waned over the next 2 days. Today she had acutely worsening chest heaviness and pressure located in her central chest with radiation to her left chest with associated SOB. She called EMS who treated her with albuterol nebulizer. Patient reports improvement with her breathing but continued chest pressure. Her symptoms lasted about an hour since the onset. No aggravating/alleviating factors. No other associated symptoms. Patient is a non smoker and states, "I'm the only one in my family who hasn't had a heart attack."   Past Medical History  Diagnosis Date  . Allergy   . Depression   . GERD (gastroesophageal reflux disease)   . Thyroid disease   . Anxiety     Past Surgical History  Procedure Laterality Date  . Abdominal hysterectomy      Family History  Problem Relation Age of Onset  . Cancer Mother   . Hypertension Mother   . Cancer Father   . Dementia Father   . Heart disease Sister   . Thyroid disease Daughter   . Heart disease Maternal Grandfather   . Heart disease Paternal Grandmother   . Thyroid disease Paternal Grandfather     History  Substance Use Topics  . Smoking status: Never Smoker   . Smokeless tobacco: Not  on file  . Alcohol Use: No    OB History   Grav Para Term Preterm Abortions TAB SAB Ect Mult Living   2 2 2       1      Obstetric Comments   One miscarriage      Review of Systems  Respiratory: Positive for chest tightness and shortness of breath.   Cardiovascular: Positive for chest pain.  All other systems reviewed and are negative.    Allergies  Amoxicillin-pot clavulanate; Codeine; Latex; Meperidine hcl; Pentazocine lactate; Simvastatin; and Morphine  Home Medications   Current Outpatient Rx  Name  Route  Sig  Dispense  Refill  . ALPRAZolam (XANAX) 0.25 MG tablet   Oral   Take 1 tablet (0.25 mg total) by mouth at bedtime as needed for sleep.   30 tablet   5   . fluticasone (VERAMYST) 27.5 MCG/SPRAY nasal spray   Nasal   Place 2 sprays into the nose daily.         Marland Kitchen levothyroxine (SYNTHROID, LEVOTHROID) 112 MCG tablet   Oral   Take 1 tablet (112 mcg total) by mouth daily.   30 tablet   7   . Olopatadine HCl (PATADAY OP)   Ophthalmic   Apply 1 drop to eye daily.         Marland Kitchen omeprazole-sodium bicarbonate (ZEGERID) 40-1100 MG per capsule   Oral   Take 1 capsule by mouth daily before breakfast.  There were no vitals taken for this visit.  Physical Exam  Nursing note and vitals reviewed. Constitutional: She is oriented to person, place, and time. She appears well-developed and well-nourished. No distress.  HENT:  Head: Normocephalic and atraumatic.  Eyes: Conjunctivae are normal.  Neck: Normal range of motion.  Cardiovascular: Normal rate and regular rhythm.  Exam reveals no gallop and no friction rub.   No murmur heard. Pulmonary/Chest: She has no wheezes. She has no rales. She exhibits no tenderness.  Wheezes noted throughout bilateral lung fields.   Abdominal: Soft. She exhibits no distension. There is no tenderness. There is no rebound and no guarding.  Musculoskeletal: Normal range of motion.  Neurological: She is alert and oriented  to person, place, and time. Coordination normal.  Speech is goal-oriented. Moves limbs without ataxia.   Skin: Skin is warm and dry.  Psychiatric: She has a normal mood and affect. Her behavior is normal.    ED Course  Procedures (including critical care time)   Date: 05/12/2012  Rate: 109  Rhythm: sinus tachycardia  QRS Axis: normal  Intervals: normal  ST/T Wave abnormalities: nonspecific ST changes  Conduction Disutrbances:none  Narrative Interpretation: NSR with minimal ST changes, which is different from previous  Old EKG Reviewed: changes noted    Labs Reviewed  BASIC METABOLIC PANEL - Abnormal; Notable for the following:    Potassium 3.4 (*)    Glucose, Bld 129 (*)    GFR calc non Af Amer 87 (*)    All other components within normal limits  CBC WITH DIFFERENTIAL  PROTIME-INR  POCT I-STAT TROPONIN I   Dg Chest 2 View  05/12/2012  *RADIOLOGY REPORT*  Clinical Data: Shortness of breath.  CHEST - 2 VIEW  Comparison: No priors.  Findings: Lungs are mildly hyperexpanded.  There is peribronchial cuffing.  No acute consolidative airspace disease.  No pleural effusions.  Pulmonary vasculature and the cardiomediastinal silhouette are within normal limits.  Surgical clips project over the right upper quadrant of the abdomen, likely from prior cholecystectomy.  IMPRESSION: 1.  Mild hyperexpansion with diffuse peribronchial cuffing. Findings are favored to reflect reactive airway disease.   Original Report Authenticated By: Trudie Reed, M.D.      1. Unstable angina pectoris       MDM  10:12 AM Labs and chest xray pending.   1:10 PM Chest xray consistent with reactive airway disease. Troponin negative. Labs unremarkable. I spoke with Dr. Mayford Knife with Cardiology who will see the patient.      Emilia Beck, PA-C 05/12/12 1709

## 2012-05-13 ENCOUNTER — Encounter (HOSPITAL_COMMUNITY)
Admission: EM | Disposition: A | Discharge status: Home / Self Care | Payer: Self-pay | Source: Home / Self Care | Attending: Emergency Medicine

## 2012-05-13 DIAGNOSIS — E876 Hypokalemia: Secondary | ICD-10-CM | POA: Diagnosis present

## 2012-05-13 DIAGNOSIS — R079 Chest pain, unspecified: Secondary | ICD-10-CM

## 2012-05-13 LAB — CBC
HCT: 34.8 % — ABNORMAL LOW (ref 36.0–46.0)
Hemoglobin: 12.3 g/dL (ref 12.0–15.0)
MCH: 30.9 pg (ref 26.0–34.0)
MCHC: 35.3 g/dL (ref 30.0–36.0)
MCV: 87.4 fL (ref 78.0–100.0)
Platelets: 187 10*3/uL (ref 150–400)
RBC: 3.98 MIL/uL (ref 3.87–5.11)
RDW: 12.4 % (ref 11.5–15.5)
WBC: 6.8 10*3/uL (ref 4.0–10.5)

## 2012-05-13 LAB — BASIC METABOLIC PANEL
BUN: 10 mg/dL (ref 6–23)
CO2: 25 mEq/L (ref 19–32)
Calcium: 9.4 mg/dL (ref 8.4–10.5)
Chloride: 106 mEq/L (ref 96–112)
Creatinine, Ser: 0.83 mg/dL (ref 0.50–1.10)
GFR calc Af Amer: 85 mL/min — ABNORMAL LOW (ref >90)
GFR calc non Af Amer: 73 mL/min — ABNORMAL LOW (ref >90)
Glucose, Bld: 88 mg/dL (ref 70–99)
Potassium: 3.9 mEq/L (ref 3.5–5.1)
Sodium: 140 mEq/L (ref 135–145)

## 2012-05-13 LAB — HEPATIC FUNCTION PANEL
ALT: 13 U/L (ref 0–35)
AST: 19 U/L (ref 0–37)
Albumin: 4.1 g/dL (ref 3.5–5.2)
Alkaline Phosphatase: 60 U/L (ref 39–117)
Bilirubin, Direct: <0.1 mg/dL (ref 0.0–0.3)
Total Bilirubin: 0.3 mg/dL (ref 0.3–1.2)
Total Protein: 7.4 g/dL (ref 6.0–8.3)

## 2012-05-13 LAB — HEMOGLOBIN A1C
Hgb A1c MFr Bld: 5.6 % (ref <5.7)
Mean Plasma Glucose: 114 mg/dL (ref <117)

## 2012-05-13 LAB — HEPARIN LEVEL (UNFRACTIONATED): Heparin Unfractionated: 0.57 IU/mL (ref 0.30–0.70)

## 2012-05-13 LAB — POCT ACTIVATED CLOTTING TIME: Activated Clotting Time: 154 seconds

## 2012-05-13 LAB — TROPONIN I
Troponin I: <0.3 ng/mL (ref <0.30)
Troponin I: <0.3 ng/mL (ref <0.30)

## 2012-05-13 SURGERY — Surgical Case

## 2012-05-13 MED ORDER — ACETAMINOPHEN 325 MG PO TABS: 650.0000 mg | ORAL_TABLET | ORAL | Status: DC | PRN

## 2012-05-13 MED ORDER — MIDAZOLAM HCL 2 MG/2ML IJ SOLN
INTRAMUSCULAR | Status: AC
  Filled 2012-05-13: qty 2

## 2012-05-13 MED ORDER — SODIUM CHLORIDE 0.9 % IV SOLN: INTRAVENOUS | Status: DC

## 2012-05-13 MED ORDER — FENTANYL CITRATE 0.05 MG/ML IJ SOLN
INTRAMUSCULAR | Status: AC
  Filled 2012-05-13: qty 2

## 2012-05-13 MED ORDER — ONDANSETRON HCL 4 MG/2ML IJ SOLN: 4.0000 mg | Freq: Four times a day (QID) | INTRAMUSCULAR | Status: DC | PRN

## 2012-05-13 MED ORDER — HEPARIN (PORCINE) IN NACL 2-0.9 UNIT/ML-% IJ SOLN
INTRAMUSCULAR | Status: AC
  Filled 2012-05-13: qty 1000

## 2012-05-13 MED ORDER — LIDOCAINE HCL (PF) 1 % IJ SOLN
INTRAMUSCULAR | Status: AC
  Filled 2012-05-13: qty 30

## 2012-05-13 NOTE — Progress Notes (Addendum)
CSW received referral today to talk with the pt regarding community support for caregivers. 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 CSW will meet with the pt today to give community support information. Full assessment to follow.                         Sherald Barge, LCSW-A Clinical Social Worker [phone]

## 2012-05-13 NOTE — CV Procedure (Signed)
    Cardiac Cath Note  TONNI MANSOUR Fitz-Gerald [id number] [birth year]  Procedure: left  Heart Cardiac Catheterization Note Indications:  Chest pain, palpitations.  Procedure Details Consent: Obtained Time Out: Verified patient identification, verified procedure, site/side was marked, verified correct patient position, special equipment/implants available, Radiology Safety Procedures followed,  medications/allergies/relevent history reviewed, required imaging and test results available.  Performed   Medications: Fentanyl: 50 mcg IV Versed: 2 mg IV  The right femoral artery was easily canulated using a modified Seldinger technique.  Hemodynamics:   LV pressure: 123/18 Aortic pressure: 120/71  Angiography   Left Main: smooth and normal   Left anterior Descending: smooth and normal. 1st diag is smooth and normal  Left Circumflex: large and dominant. Smooth and normal .  OM1 is large and normal.  The PDA is normal  Right Coronary Artery: small and non-dominant  LV Gram: vigorous LV function  Complications: No apparent complications Patient did tolerate procedure well.  Contrast used: 55 cc  Conclusions:   1. Smooth and normal coronaries 2. Normal LV function.   Vesta Mixer, Montez Hageman., MD, [facility] 05/13/2012, 12:32 PM Office - [phone] Pager [phone]

## 2012-05-13 NOTE — Progress Notes (Signed)
Utilization review completed.  

## 2012-05-13 NOTE — Interval H&P Note (Signed)
History and Physical Interval Note:  05/13/2012 12:02 PM  Mary Rubio  has presented today for surgery, with the diagnosis of Chest pain  The various methods of treatment have been discussed with the patient and family. After consideration of risks, benefits and other options for treatment, the patient has consented to  Procedure(s): LEFT HEART CATHETERIZATION WITH CORONARY ANGIOGRAM (N/A) as a surgical intervention .  The patient's history has been reviewed, patient examined, no change in status, stable for surgery.  I have reviewed the patient's chart and labs.  Questions were answered to the patient's satisfaction.     Elyn Aquas.

## 2012-05-13 NOTE — Discharge Summary (Signed)
CARDIOLOGY DISCHARGE SUMMARY   Patient ID: Mary Rubio MRN: [id number] DOB/AGE: Aug 20, 1945 67 y.o.  Admit date: 05/12/2012 Discharge date: 05/13/2012  Primary Discharge Diagnosis:     Precordial pain  Secondary Discharge Diagnosis:    Hypokalemia  Procedures: Left heart cardiac catheterization, coronary arteriogram, left ventriculogram  Hospital Course: SHEREKA LAFORTUNE Fitz-Gerald is a 67 y.o. female with no history of CAD. She has multiple cardiac risk factors. She had chest pain and sinus tachycardia. Her symptoms improved with nitroglycerin. She went to the emergency room where she was admitted for further evaluation and treatment.  She was started on heparin, aspirin and sublingual nitroglycerin was used when necessary. Her cardiac enzymes were negative for MI. A recent lipid profile was reviewed, but she has been intolerant of multiple statins in the past. We will leave management to her primary care physician.   Her symptoms were concerning for unstable anginal pain and she has multiple cardiac risk factors so she was taken to the Cath Lab on 05/13/2012.  Full cardiac catheterization results are below. She had no coronary artery disease and her EF was preserved. Post-cath, she was ambulating without chest pain or shortness of breath and considered stable for discharge, to follow up as an outpatient.  Labs:  Lab Results  Component Value Date   WBC 6.8 05/13/2012   HGB 12.3 05/13/2012   HCT 34.8* 05/13/2012   MCV 87.4 05/13/2012   PLT 187 05/13/2012     Recent Labs Lab 05/12/12 2221 05/13/12 0445  NA  --  140  K  --  3.9  CL  --  106  CO2  --  25  BUN  --  10  CREATININE  --  0.83  CALCIUM  --  9.4  PROT 7.4  --   BILITOT 0.3  --   ALKPHOS 60  --   ALT 13  --   AST 19  --   GLUCOSE  --  88    Recent Labs  05/12/12 2221 05/13/12 0445 05/13/12 0915  TROPONINI <0.30 <0.30 <0.30   Lipid Panel     Component Value Date/Time   CHOL 229*  04/27/2012 1236   TRIG 104 04/27/2012 1236   HDL 62 04/27/2012 1236   CHOLHDL 3.7 04/27/2012 1236   VLDL 21 04/27/2012 1236   LDLCALC 146* 04/27/2012 1236    Recent Labs  05/12/12 1241  INR 0.87   Lab Results  Component Value Date   TSH 2.096 04/27/2012     Radiology: Dg Chest 2 View 05/12/2012  *RADIOLOGY REPORT*  Clinical Data: Shortness of breath.  CHEST - 2 VIEW  Comparison: No priors.  Findings: Lungs are mildly hyperexpanded.  There is peribronchial cuffing.  No acute consolidative airspace disease.  No pleural effusions.  Pulmonary vasculature and the cardiomediastinal silhouette are within normal limits.  Surgical clips project over the right upper quadrant of the abdomen, likely from prior cholecystectomy.  IMPRESSION: 1.  Mild hyperexpansion with diffuse peribronchial cuffing. Findings are favored to reflect reactive airway disease.   Original Report Authenticated By: Trudie Reed, M.D.    Cardiac Cath: 05/13/2012 Left Main: smooth and normal  Left anterior Descending: smooth and normal. 1st diag is smooth and normal  Left Circumflex: large and dominant. Smooth and normal . OM1 is large and normal. The PDA is normal  Right Coronary Artery: small and non-dominant  LV Gram: vigorous LV function  EKG: 13-May-2012 05:20:21 Normal sinus rhythm Low voltage QRS Vent. rate  86 BPM PR interval 154 ms QRS duration 70 ms QT/QTc 372/445 ms P-R-T axes 73 39 58  FOLLOW UP PLANS AND APPOINTMENTS Allergies  Allergen Reactions  . Amoxicillin-Pot Clavulanate Nausea And Vomiting  . Crestor (Rosuvastatin)     Myalgias  . Latex Other (See Comments)    Canker sores in mouth (discovered by dentist)  . Meperidine Hcl Other (See Comments)    Does not remember   . Morphine And Related Itching    Has tolerated small doses of hydrocodone  . Pentazocine Lactate Nausea And Vomiting    Sick on stomach  . Simvastatin Other (See Comments)    Muscle aches   . Codeine Rash    Has tolerated small doses  of hydrocodone  . Morphine Rash     Medication List    TAKE these medications       ALPRAZolam 0.25 MG tablet  Commonly known as:  XANAX  Take 1 tablet (0.25 mg total) by mouth at bedtime as needed for sleep.     fluticasone 27.5 MCG/SPRAY nasal spray  Commonly known as:  VERAMYST  Place 2 sprays into the nose daily.     levothyroxine 112 MCG tablet  Commonly known as:  SYNTHROID, LEVOTHROID  Take 1 tablet (112 mcg total) by mouth daily.     omeprazole-sodium bicarbonate 40-1100 MG per capsule  Commonly known as:  ZEGERID  Take 1 capsule by mouth daily before breakfast.     PATADAY OP  Apply 1 drop to eye daily.        Discharge Orders   Future Orders Complete By Expires     Diet - low sodium heart healthy  As directed     Increase activity slowly  As directed       Follow-up Information   Follow up with Tonny Bollman, MD. (As needed)    Contact information:   [address] [place] [address] [phone]       BRING ALL MEDICATIONS WITH YOU TO FOLLOW UP APPOINTMENTS  Time spent with patient to include physician time: 35 min Signed: Theodore Demark, PA-C 05/13/2012, 4:15 PM Co-Sign MD  Attending Note:   The patient was seen and examined.  Agree with assessment and plan as noted above.  Changes made to the above note as needed.  Patient has normal coronaries.  Stable to be discharged.  Vesta Mixer, Montez Hageman., MD, [facility] 05/15/2012, 8:51 PM

## 2012-05-13 NOTE — Progress Notes (Signed)
Met with pt and family at bedside to give the pt information about support group information for care givers, plus other community resources. CSW spent 25 min talking with the pt and her family members about Alzheimers, and other care giver issues.  No other CSW needs for this pt.   Sherald Barge, LCSW-A Clinical Social Worker [phone]

## 2012-05-17 NOTE — ED Provider Notes (Signed)
Medical screening examination/treatment/procedure(s) were conducted as a shared visit with non-physician practitioner(s) and myself.  I personally evaluated the patient during the encounter.  Raeford Razor, MD 05/17/12 [phone]

## 2012-05-26 DIAGNOSIS — K449 Diaphragmatic hernia without obstruction or gangrene: Secondary | ICD-10-CM | POA: Insufficient documentation

## 2012-05-26 DIAGNOSIS — K219 Gastro-esophageal reflux disease without esophagitis: Secondary | ICD-10-CM | POA: Insufficient documentation

## 2012-05-26 DIAGNOSIS — R1310 Dysphagia, unspecified: Secondary | ICD-10-CM | POA: Insufficient documentation

## 2012-06-26 ENCOUNTER — Emergency Department (HOSPITAL_COMMUNITY): Payer: BC Managed Care – PPO

## 2012-06-26 ENCOUNTER — Encounter (HOSPITAL_COMMUNITY): Payer: Self-pay

## 2012-06-26 ENCOUNTER — Emergency Department (HOSPITAL_COMMUNITY)
Admission: EM | Admit: 2012-06-26 | Discharge: 2012-06-26 | Disposition: A | Discharge status: Home / Self Care | Payer: BC Managed Care – PPO | Attending: Emergency Medicine | Admitting: Emergency Medicine

## 2012-06-26 ENCOUNTER — Ambulatory Visit (INDEPENDENT_AMBULATORY_CARE_PROVIDER_SITE_OTHER): Payer: BC Managed Care – PPO | Admitting: Internal Medicine

## 2012-06-26 ENCOUNTER — Ambulatory Visit: Payer: BC Managed Care – PPO

## 2012-06-26 VITALS — BP 134/72 | HR 104 | Temp 97.5°F | Resp 18 | Ht 62.0 in | Wt 132.0 lb

## 2012-06-26 DIAGNOSIS — Z79899 Other long term (current) drug therapy: Secondary | ICD-10-CM | POA: Insufficient documentation

## 2012-06-26 DIAGNOSIS — R1011 Right upper quadrant pain: Secondary | ICD-10-CM

## 2012-06-26 DIAGNOSIS — Z862 Personal history of diseases of the blood and blood-forming organs and certain disorders involving the immune mechanism: Secondary | ICD-10-CM | POA: Insufficient documentation

## 2012-06-26 DIAGNOSIS — R112 Nausea with vomiting, unspecified: Secondary | ICD-10-CM | POA: Insufficient documentation

## 2012-06-26 DIAGNOSIS — R197 Diarrhea, unspecified: Secondary | ICD-10-CM

## 2012-06-26 DIAGNOSIS — R509 Fever, unspecified: Secondary | ICD-10-CM | POA: Insufficient documentation

## 2012-06-26 DIAGNOSIS — R109 Unspecified abdominal pain: Secondary | ICD-10-CM

## 2012-06-26 DIAGNOSIS — R61 Generalized hyperhidrosis: Secondary | ICD-10-CM | POA: Insufficient documentation

## 2012-06-26 DIAGNOSIS — Z91040 Latex allergy status: Secondary | ICD-10-CM | POA: Insufficient documentation

## 2012-06-26 DIAGNOSIS — Z8719 Personal history of other diseases of the digestive system: Secondary | ICD-10-CM | POA: Insufficient documentation

## 2012-06-26 DIAGNOSIS — R630 Anorexia: Secondary | ICD-10-CM | POA: Insufficient documentation

## 2012-06-26 DIAGNOSIS — Z90710 Acquired absence of both cervix and uterus: Secondary | ICD-10-CM | POA: Insufficient documentation

## 2012-06-26 DIAGNOSIS — E039 Hypothyroidism, unspecified: Secondary | ICD-10-CM | POA: Insufficient documentation

## 2012-06-26 DIAGNOSIS — F3289 Other specified depressive episodes: Secondary | ICD-10-CM | POA: Insufficient documentation

## 2012-06-26 DIAGNOSIS — R1031 Right lower quadrant pain: Secondary | ICD-10-CM | POA: Insufficient documentation

## 2012-06-26 DIAGNOSIS — K219 Gastro-esophageal reflux disease without esophagitis: Secondary | ICD-10-CM | POA: Insufficient documentation

## 2012-06-26 DIAGNOSIS — F329 Major depressive disorder, single episode, unspecified: Secondary | ICD-10-CM | POA: Insufficient documentation

## 2012-06-26 DIAGNOSIS — F411 Generalized anxiety disorder: Secondary | ICD-10-CM | POA: Insufficient documentation

## 2012-06-26 DIAGNOSIS — Z8639 Personal history of other endocrine, nutritional and metabolic disease: Secondary | ICD-10-CM | POA: Insufficient documentation

## 2012-06-26 LAB — POCT CBC
Granulocyte percent: 78.7 %G (ref 37–80)
HCT, POC: 44.3 % (ref 37.7–47.9)
Hemoglobin: 14.2 g/dL (ref 12.2–16.2)
Lymph, poc: 1.9 (ref 0.6–3.4)
MCH, POC: 30.9 pg (ref 27–31.2)
MCHC: 32.1 g/dL (ref 31.8–35.4)
MCV: 96.6 fL (ref 80–97)
MID (cbc): 0.5 (ref 0–0.9)
MPV: 8.1 fL (ref 0–99.8)
POC Granulocyte: 8.9 — AB (ref 2–6.9)
POC LYMPH PERCENT: 16.5 %L (ref 10–50)
POC MID %: 4.8 %M (ref 0–12)
Platelet Count, POC: 249 10*3/uL (ref 142–424)
RBC: 4.59 M/uL (ref 4.04–5.48)
RDW, POC: 12.9 %
WBC: 11.3 10*3/uL — AB (ref 4.6–10.2)

## 2012-06-26 LAB — URINALYSIS, ROUTINE W REFLEX MICROSCOPIC
Bilirubin Urine: NEGATIVE
Glucose, UA: NEGATIVE mg/dL
Ketones, ur: NEGATIVE mg/dL
Leukocytes, UA: NEGATIVE
Nitrite: NEGATIVE
Protein, ur: NEGATIVE mg/dL
Specific Gravity, Urine: 1.013 (ref 1.005–1.030)
Urobilinogen, UA: 0.2 mg/dL (ref 0.0–1.0)
pH: 6.5 (ref 5.0–8.0)

## 2012-06-26 LAB — CBC WITH DIFFERENTIAL/PLATELET
Basophils Absolute: 0 10*3/uL (ref 0.0–0.1)
Basophils Relative: 0 % (ref 0–1)
Eosinophils Absolute: 0.1 10*3/uL (ref 0.0–0.7)
Eosinophils Relative: 1 % (ref 0–5)
HCT: 40.9 % (ref 36.0–46.0)
Hemoglobin: 14.1 g/dL (ref 12.0–15.0)
Lymphocytes Relative: 16 % (ref 12–46)
Lymphs Abs: 1.7 10*3/uL (ref 0.7–4.0)
MCH: 30.4 pg (ref 26.0–34.0)
MCHC: 34.5 g/dL (ref 30.0–36.0)
MCV: 88.1 fL (ref 78.0–100.0)
Monocytes Absolute: 0.6 10*3/uL (ref 0.1–1.0)
Monocytes Relative: 5 % (ref 3–12)
Neutro Abs: 8.4 10*3/uL — ABNORMAL HIGH (ref 1.7–7.7)
Neutrophils Relative %: 78 % — ABNORMAL HIGH (ref 43–77)
Platelets: 215 10*3/uL (ref 150–400)
RBC: 4.64 MIL/uL (ref 3.87–5.11)
RDW: 12.5 % (ref 11.5–15.5)
WBC: 10.8 10*3/uL — ABNORMAL HIGH (ref 4.0–10.5)

## 2012-06-26 LAB — COMPREHENSIVE METABOLIC PANEL
ALT: 13 U/L (ref 0–35)
AST: 17 U/L (ref 0–37)
Albumin: 4.3 g/dL (ref 3.5–5.2)
Alkaline Phosphatase: 71 U/L (ref 39–117)
BUN: 12 mg/dL (ref 6–23)
CO2: 25 mEq/L (ref 19–32)
Calcium: 10 mg/dL (ref 8.4–10.5)
Chloride: 102 mEq/L (ref 96–112)
Creatinine, Ser: 0.83 mg/dL (ref 0.50–1.10)
GFR calc Af Amer: 85 mL/min — ABNORMAL LOW (ref >90)
GFR calc non Af Amer: 73 mL/min — ABNORMAL LOW (ref >90)
Glucose, Bld: 111 mg/dL — ABNORMAL HIGH (ref 70–99)
Potassium: 4.3 mEq/L (ref 3.5–5.1)
Sodium: 140 mEq/L (ref 135–145)
Total Bilirubin: 0.3 mg/dL (ref 0.3–1.2)
Total Protein: 8.2 g/dL (ref 6.0–8.3)

## 2012-06-26 LAB — POCT URINALYSIS DIPSTICK
Bilirubin, UA: NEGATIVE
Glucose, UA: NEGATIVE
Ketones, UA: 40
Leukocytes, UA: NEGATIVE
Nitrite, UA: NEGATIVE
Protein, UA: 30
Spec Grav, UA: 1.025
Urobilinogen, UA: 0.2
pH, UA: 6

## 2012-06-26 LAB — POCT UA - MICROSCOPIC ONLY
Bacteria, U Microscopic: NEGATIVE
Casts, Ur, LPF, POC: NEGATIVE
Crystals, Ur, HPF, POC: NEGATIVE
Mucus, UA: NEGATIVE
WBC, Ur, HPF, POC: NEGATIVE
Yeast, UA: NEGATIVE

## 2012-06-26 LAB — URINE MICROSCOPIC-ADD ON: Urine-Other: NONE SEEN

## 2012-06-26 MED ORDER — OMEPRAZOLE 20 MG PO CPDR: 20.0000 mg | DELAYED_RELEASE_CAPSULE | Freq: Every day | ORAL | Status: DC

## 2012-06-26 MED ORDER — IOHEXOL 300 MG/ML  SOLN
100.0000 mL | Freq: Once | INTRAMUSCULAR | Status: AC | PRN
  Administered 2012-06-26: 100 mL via INTRAVENOUS

## 2012-06-26 MED ORDER — SUCRALFATE 1 G PO TABS: 1.0000 g | ORAL_TABLET | Freq: Four times a day (QID) | ORAL | Status: DC

## 2012-06-26 MED ORDER — SODIUM CHLORIDE 0.9 % IV BOLUS (SEPSIS)
1000.0000 mL | Freq: Once | INTRAVENOUS | Status: AC
  Administered 2012-06-26: 1000 mL via INTRAVENOUS

## 2012-06-26 MED ORDER — IOHEXOL 300 MG/ML  SOLN
50.0000 mL | Freq: Once | INTRAMUSCULAR | Status: AC | PRN
  Administered 2012-06-26: 50 mL via ORAL

## 2012-06-26 NOTE — ED Provider Notes (Signed)
History     CSN: [id number]  Arrival date & time 06/26/12  1621   First MD Initiated Contact with Patient 06/26/12 1719      Chief Complaint  Patient presents with  . Abdominal Pain    (Consider location/radiation/quality/duration/timing/severity/associated sxs/prior treatment) HPI  Patient presents with one week of abdominal pain.  Initially, the pain was superior, associated with anorexia, postprandial discomfort and nausea.  Over the interval week she has developed pain in the right lower quadrant, with new emesis, continued anorexia, generalized discomfort as well. Patient complains of chills, diaphoresis, denies objective fever. No attempt at relief with anything as far. There is associated diarrhea.   Past Medical History  Diagnosis Date  . Environmental allergies     Weekly allergy shots  . Depression   . GERD (gastroesophageal reflux disease)   . Hypothyroidism   . Anxiety   . Hyperlipidemia   . Hiatal hernia     Past Surgical History  Procedure Laterality Date  . Abdominal hysterectomy    . Cholecystectomy    . Bunionectomy    . Thumb surgery      for cyst  . Exploratory laparotomy      endometriosis  . Bartholin gland cyst excision    . Facial cyst      excision    Family History  Problem Relation Age of Onset  . Cancer Mother   . Hypertension Mother   . Cancer Father   . Dementia Father   . Heart disease Sister   . Thyroid disease Daughter   . Heart disease Maternal Grandfather   . Heart disease Paternal Grandmother   . Thyroid disease Paternal Grandfather   . Arrhythmia Father   . CAD Sister     MI/stent age 30, CABG age 59  . CAD      Several relatives on dad's side have had MIs/CABG    History  Substance Use Topics  . Smoking status: Never Smoker   . Smokeless tobacco: Not on file  . Alcohol Use: Yes     Comment: 1x/week or less    OB History   Grav Para Term Preterm Abortions TAB SAB Ect Mult Living   2 2 2       1      Obstetric Comments   One miscarriage      Review of Systems  Constitutional:       Per HPI, otherwise negative  HENT:       Per HPI, otherwise negative  Respiratory:       Per HPI, otherwise negative  Cardiovascular:       Per HPI, otherwise negative  Gastrointestinal: Positive for nausea, vomiting and diarrhea.  Endocrine:       Negative aside from HPI  Genitourinary:       Neg aside from HPI   Musculoskeletal:       Per HPI, otherwise negative  Skin: Negative.   Neurological: Negative for syncope.    Allergies  Amoxicillin-pot clavulanate; Crestor; Latex; Meperidine hcl; Morphine and related; Pentazocine lactate; Simvastatin; Codeine; and Morphine  Home Medications   Current Outpatient Rx  Name  Route  Sig  Dispense  Refill  . albuterol (PROVENTIL HFA;VENTOLIN HFA) 108 (90 BASE) MCG/ACT inhaler   Inhalation   Inhale 2 puffs into the lungs every 6 (six) hours as needed for wheezing or shortness of breath.         . fluticasone (VERAMYST) 27.5 MCG/SPRAY nasal spray   Nasal  Place 2 sprays into the nose daily.         . lansoprazole (PREVACID) 30 MG capsule   Oral   Take 30 mg by mouth daily.         Marland Kitchen levothyroxine (SYNTHROID, LEVOTHROID) 112 MCG tablet   Oral   Take 1 tablet (112 mcg total) by mouth daily.   30 tablet   7   . Olopatadine HCl (PATADAY) 0.2 % SOLN   Both Eyes   Place 1 drop into both eyes daily.         Marland Kitchen ALPRAZolam (XANAX) 0.25 MG tablet   Oral   Take 1 tablet (0.25 mg total) by mouth at bedtime as needed for sleep.   30 tablet   5     There were no vitals taken for this visit.  Physical Exam  Nursing note and vitals reviewed. Constitutional: She is oriented to person, place, and time. She appears well-developed and well-nourished. No distress.  HENT:  Head: Normocephalic and atraumatic.  Eyes: Conjunctivae and EOM are normal.  Cardiovascular: Normal rate and regular rhythm.   Pulmonary/Chest: Effort normal and breath  sounds normal. No stridor. No respiratory distress.  Abdominal: She exhibits no distension. There is no hepatosplenomegaly. There is tenderness. There is guarding and tenderness at McBurney's point. There is no rigidity and no rebound.  Musculoskeletal: She exhibits no edema.  Neurological: She is alert and oriented to person, place, and time. No cranial nerve deficit.  Skin: Skin is warm and dry.  Psychiatric: She has a normal mood and affect.    ED Course  Procedures (including critical care time)  Labs Reviewed  CBC WITH DIFFERENTIAL - Abnormal; Notable for the following:    WBC 10.8 (*)    Neutrophils Relative % 78 (*)    Neutro Abs 8.4 (*)    All other components within normal limits  COMPREHENSIVE METABOLIC PANEL - Abnormal; Notable for the following:    Glucose, Bld 111 (*)    GFR calc non Af Amer 73 (*)    GFR calc Af Amer 85 (*)    All other components within normal limits  URINALYSIS, ROUTINE W REFLEX MICROSCOPIC   Dg Abd Acute W/chest  06/26/2012   *RADIOLOGY REPORT*  Clinical Data: Abdominal pain.  ACUTE ABDOMEN SERIES (ABDOMEN 2 VIEW & CHEST 1 VIEW)  Comparison: None.  Findings: Mild blunting of the costophrenic angles on the frontal view.  Pleural apical scarring.  Suggestion of emphysema. Cardiopericardial silhouette within normal limits.  No free air under the hemidiaphragms.  Cholecystectomy clips are present in the right upper quadrant.  The abdomen is relatively gasless.  Small amount of bowel gas is present within the anatomic pelvis.  No pathologic air fluid levels.  IMPRESSION: No acute abnormality.  Blunting of the costophrenic angles bilaterally may represent scarring or tiny amount of pleural fluid.  Clinically significant discrepancy from primary report, if provided: None   Original Report Authenticated By: Andreas Newport, M.D.     No diagnosis found.  Update: Patient is ambulatory  MDM  Patient presents with one week of abdominal pain migratory from the  epigastrium to the lower abdomen.  On exam she is awake alert, non-peritoneal abdomen is tender to palpation in the right lower quadrant.  Given prescription of initial epigastric discomfort, there suspicion for gastroesophageal etiology, though with the right lower quadrant pain, appendicitis was ruled out with CT scan.  The patient is hemodynamic stable, though she was mildly tachycardic initially.  She did have mild leukocytosis, there is low suspicion for acute occult systemic infection. The patient was started on PPI therapy, discharged to follow up with GI.    Gerhard Munch, MD 06/26/12 2006

## 2012-06-26 NOTE — Patient Instructions (Addendum)
Go to [facility] Emergency Room for further treatment.  Abdominal Pain Abdominal pain can be caused by many things. Your caregiver decides the seriousness of your pain by an examination and possibly blood tests and X-rays. Many cases can be observed and treated at home. Most abdominal pain is not caused by a disease and will probably improve without treatment. However, in many cases, more time must pass before a clear cause of the pain can be found. Before that point, it may not be known if you need more testing, or if hospitalization or surgery is needed. HOME CARE INSTRUCTIONS   Do not take laxatives unless directed by your caregiver.  Take pain medicine only as directed by your caregiver.  Only take over-the-counter or prescription medicines for pain, discomfort, or fever as directed by your caregiver.  Try a clear liquid diet (broth, tea, or water) for as long as directed by your caregiver. Slowly move to a bland diet as tolerated. SEEK IMMEDIATE MEDICAL CARE IF:   The pain does not go away.  You have a fever.  You keep throwing up (vomiting).  The pain is felt only in portions of the abdomen. Pain in the right side could possibly be appendicitis. In an adult, pain in the left lower portion of the abdomen could be colitis or diverticulitis.  You pass bloody or black tarry stools. MAKE SURE YOU:   Understand these instructions.  Will watch your condition.  Will get help right away if you are not doing well or get worse. Document Released: 10/15/2004 Document Revised: 03/30/2011 Document Reviewed: 08/24/2007 [facility] Patient Information 2014 [place], [place].

## 2012-06-26 NOTE — ED Notes (Signed)
She c/o rlq abd. Area pain x 1 week.  Saw her PCP today, who recommended she come here to be "checked for appendicitis".  She is in no distress.

## 2012-06-26 NOTE — ED Notes (Addendum)
Pt report intermittent n/v/d and RLQ abd pain that began last week. Has not taken anything for pain or nausea. Had xray at pcp today, sent here to rule out appendicitis. States abd pain subsides after diarrhea episodes. Reports "terrible sweats" with abd pain, reports "almost passing out" from it. Has been burping frequently. States she has not been able to eat anything, says she lost 2 lbs in the past week.

## 2012-06-26 NOTE — Progress Notes (Signed)
Subjective:    Patient ID: Mary Rubio, female    DOB: 1945-04-30, 67 y.o.   MRN: [id number]  HPI Patient reports abdominal pain right lower quadrant for seven days.  Complains of decreased appetite States she has lost about 2 lbs this week due to this illness. She reports she is afraid to eat because eating and drinking increases pain. States has been on a bland diet with minimal relief of pain. Eating greatly increases the pain.  Reports she drank some coffee this morning at 9am. Felt like she had to vomit then had episode of diarrhea. She did have one episode of vomiting last week. The pain resolved slightly after vomiting episode last week and resolved some after the diarrhea episode this morning. She reports increased belching, feels like there is acid burning her when she belches. Bending over increases the pain, feels shooting right lower quadrant pain. Has been hospitalized recently (April) there were concerns she had a cardiac event, but pain was from her asthma. 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 Reports she feels like she wants to sleep a lot since this illness began, fatigue. She does report reflux symptoms as well, but this is worse than previous episode of GERD. Patient states she has had one episode of diverticulosis in the past when she was 67 y.o. She has not had any recent problems with this. Complains of waves of nausea and pain, feels cold then is sweating. States she does still have her appendix. She has had cholecystectomy in 1995. Normal cardiac catheterization April 25th 2014 Review of Systems     Objective:   Physical Exam No swelling lower extremities tenderness of the right lower quadrant. Has rebound tenderness. Guarding of abdomen.  Results for orders placed in visit on 06/26/12  POCT CBC      Result Value Range   WBC 11.3 (*) 4.6 - 10.2 K/uL   Lymph, poc 1.9  0.6 - 3.4   POC LYMPH PERCENT 16.5  10 - 50 %L   MID (cbc) 0.5  0 - 0.9   POC MID % 4.8  0 - 12 %M   POC Granulocyte 8.9 (*) 2 - 6.9   Granulocyte percent 78.7  37 - 80 %G   RBC 4.59  4.04 - 5.48 M/uL   Hemoglobin 14.2  12.2 - 16.2 g/dL   HCT, POC 81.1  91.4 - 47.9 %   MCV 96.6  80 - 97 fL   MCH, POC 30.9  27 - 31.2 pg   MCHC 32.1  31.8 - 35.4 g/dL   RDW, POC 78.2     Platelet Count, POC 249  142 - 424 K/uL   MPV 8.1  0 - 99.8 fL  POCT URINALYSIS DIPSTICK      Result Value Range   Color, UA yellow     Clarity, UA clear     Glucose, UA neg     Bilirubin, UA neg     Ketones, UA 40     Spec Grav, UA 1.025     Blood, UA moderate     pH, UA 6.0     Protein, UA 30     Urobilinogen, UA 0.2     Nitrite, UA neg     Leukocytes, UA Negative    POCT UA - MICROSCOPIC ONLY      Result Value Range   WBC, Ur, HPF, POC neg     RBC, urine, microscopic 0-1  Bacteria, U Microscopic neg     Mucus, UA neg     Epithelial cells, urine per micros 0-1     Crystals, Ur, HPF, POC neg     Casts, Ur, LPF, POC neg     Yeast, UA neg           Assessment & Plan:  Urine culture.  To emergency department for C T scan.

## 2012-06-26 NOTE — Progress Notes (Signed)
  Subjective:    Patient ID: Mary Rubio, female    DOB: [birth year], 67 y.o.   MRN: [id number]  HPI 1 week of progressive RLQ abdominal pain, getting worse and more localized. Still has appendix, hx of GB removal. Only one loose stool.Is nauseated.   Review of Systems     Objective:   Physical Exam  Constitutional: She is oriented to person, place, and time. She appears well-developed and well-nourished. She appears distressed.  Eyes: EOM are normal. No scleral icterus.  Neck: Neck supple.  Cardiovascular: Regular rhythm.  Tachycardia present.  Exam reveals gallop.   Pulmonary/Chest: Effort normal and breath sounds normal.  Abdominal: She exhibits distension. She exhibits no mass. There is tenderness. There is rebound and guarding.  Neurological: She is alert and oriented to person, place, and time. She exhibits normal muscle tone. Coordination normal.  Psychiatric: She has a normal mood and affect.   In pain  [facility] reading (PRIMARY) by  Dr Perrin Maltese ileus, no free air, possible effusion right lung Results for orders placed in visit on 06/26/12  POCT CBC      Result Value Range   WBC 11.3 (*) 4.6 - 10.2 K/uL   Lymph, poc 1.9  0.6 - 3.4   POC LYMPH PERCENT 16.5  10 - 50 %L   MID (cbc) 0.5  0 - 0.9   POC MID % 4.8  0 - 12 %M   POC Granulocyte 8.9 (*) 2 - 6.9   Granulocyte percent 78.7  37 - 80 %G   RBC 4.59  4.04 - 5.48 M/uL   Hemoglobin 14.2  12.2 - 16.2 g/dL   HCT, POC 52.8  41.3 - 47.9 %   MCV 96.6  80 - 97 fL   MCH, POC 30.9  27 - 31.2 pg   MCHC 32.1  31.8 - 35.4 g/dL   RDW, POC 24.4     Platelet Count, POC 249  142 - 424 K/uL   MPV 8.1  0 - 99.8 fL  POCT URINALYSIS DIPSTICK      Result Value Range   Color, UA yellow     Clarity, UA clear     Glucose, UA neg     Bilirubin, UA neg     Ketones, UA 40     Spec Grav, UA 1.025     Blood, UA moderate     pH, UA 6.0     Protein, UA 30     Urobilinogen, UA 0.2     Nitrite, UA neg     Leukocytes, UA  Negative    POCT UA - MICROSCOPIC ONLY      Result Value Range   WBC, Ur, HPF, POC neg     RBC, urine, microscopic 0-1     Bacteria, U Microscopic neg     Mucus, UA neg     Epithelial cells, urine per micros 0-1     Crystals, Ur, HPF, POC neg     Casts, Ur, LPF, POC neg     Yeast, UA neg           Assessment & Plan:  Refer to ER Needs CT abdomen r/o appendicitis and other Er called and waiting on her

## 2012-06-28 ENCOUNTER — Ambulatory Visit (INDEPENDENT_AMBULATORY_CARE_PROVIDER_SITE_OTHER): Payer: BC Managed Care – PPO | Admitting: Internal Medicine

## 2012-06-28 VITALS — BP 120/74 | HR 99 | Temp 98.0°F | Resp 16 | Ht 62.5 in | Wt 132.2 lb

## 2012-06-28 DIAGNOSIS — K449 Diaphragmatic hernia without obstruction or gangrene: Secondary | ICD-10-CM

## 2012-06-28 DIAGNOSIS — R197 Diarrhea, unspecified: Secondary | ICD-10-CM

## 2012-06-28 DIAGNOSIS — R1110 Vomiting, unspecified: Secondary | ICD-10-CM

## 2012-06-28 DIAGNOSIS — R109 Unspecified abdominal pain: Secondary | ICD-10-CM

## 2012-06-28 LAB — POCT UA - MICROSCOPIC ONLY
Bacteria, U Microscopic: NEGATIVE
Casts, Ur, LPF, POC: NEGATIVE
Crystals, Ur, HPF, POC: NEGATIVE
Epithelial cells, urine per micros: NEGATIVE
Mucus, UA: NEGATIVE
Yeast, UA: NEGATIVE

## 2012-06-28 LAB — COMPREHENSIVE METABOLIC PANEL
ALT: 12 U/L (ref 0–35)
AST: 16 U/L (ref 0–37)
Albumin: 4.4 g/dL (ref 3.5–5.2)
Alkaline Phosphatase: 66 U/L (ref 39–117)
BUN: 13 mg/dL (ref 6–23)
CO2: 25 mEq/L (ref 19–32)
Calcium: 9.9 mg/dL (ref 8.4–10.5)
Chloride: 103 mEq/L (ref 96–112)
Creat: 0.85 mg/dL (ref 0.50–1.10)
Glucose, Bld: 108 mg/dL — ABNORMAL HIGH (ref 70–99)
Potassium: 4.5 mEq/L (ref 3.5–5.3)
Sodium: 141 mEq/L (ref 135–145)
Total Bilirubin: 0.4 mg/dL (ref 0.3–1.2)
Total Protein: 7.6 g/dL (ref 6.0–8.3)

## 2012-06-28 LAB — POCT CBC
Granulocyte percent: 60.3 %G (ref 37–80)
HCT, POC: 43.5 % (ref 37.7–47.9)
Hemoglobin: 13.7 g/dL (ref 12.2–16.2)
Lymph, poc: 2.6 (ref 0.6–3.4)
MCH, POC: 30.7 pg (ref 27–31.2)
MCHC: 31.5 g/dL — AB (ref 31.8–35.4)
MCV: 97.5 fL — AB (ref 80–97)
MID (cbc): 0.5 (ref 0–0.9)
MPV: 8.6 fL (ref 0–99.8)
POC Granulocyte: 4.8 (ref 2–6.9)
POC LYMPH PERCENT: 33.3 %L (ref 10–50)
POC MID %: 6.4 %M (ref 0–12)
Platelet Count, POC: 245 10*3/uL (ref 142–424)
RBC: 4.46 M/uL (ref 4.04–5.48)
RDW, POC: 13.2 %
WBC: 7.9 10*3/uL (ref 4.6–10.2)

## 2012-06-28 LAB — POCT URINALYSIS DIPSTICK
Bilirubin, UA: NEGATIVE
Glucose, UA: NEGATIVE
Ketones, UA: NEGATIVE
Nitrite, UA: NEGATIVE
Protein, UA: NEGATIVE
Spec Grav, UA: 1.01
Urobilinogen, UA: 0.2
pH, UA: 7

## 2012-06-28 LAB — POCT SEDIMENTATION RATE: POCT SED RATE: 37 mm/hr — AB (ref 0–22)

## 2012-06-28 LAB — LIPASE: Lipase: 17 U/L (ref 0–75)

## 2012-06-28 LAB — URINE CULTURE
Colony Count: NO GROWTH
Organism ID, Bacteria: NO GROWTH

## 2012-06-28 MED ORDER — SUCRALFATE 1 GM/10ML PO SUSP: 1.0000 g | Freq: Four times a day (QID) | ORAL | Status: DC

## 2012-06-28 MED ORDER — CILIDINIUM-CHLORDIAZEPOXIDE 2.5-5 MG PO CAPS: 1.0000 | ORAL_CAPSULE | Freq: Three times a day (TID) | ORAL | Status: DC

## 2012-06-28 NOTE — Progress Notes (Signed)
Subjective:    Patient ID: Mary Rubio, female    DOB: [birth year], 67 y.o.   MRN: [id number]  HPI  67 year old female following up from ED RLQ discomfort, vomiting x 9 days./ vomiting or diarrhea when taking prevacid, loss of  appetite. 2lb loss since last week. Pain whenever she eats/pain to point of diaphoresis/burping all the time diarrh once today-no intake Took PPI and threw up today PPIs Were started by dr Loreta Ave in April for severe reflux-had been on otc rantidine self treating for years Cramps in feet and calves Had consult w/ fernandez for fundopli and was terrified so will not proceed Colon ok 2010  Has not had endoscopy despite prolonged problems with esophageal reflux Note recent barium swallow  Patient Active Problem List   Diagnosis Date Noted  . Dysphagia 05/26/2012 [facility]   . Esophageal reflux 05/26/2012  . Diaphragmatic hernia 05/26/2012  . Hypokalemia 05/13/2012  . Precordial pain 05/12/2012  . HYPOTHYROIDISM 08/14/2007  . HYPERLIPIDEMIA 08/14/2007  . ALLERGIC RHINITIS 08/14/2007  . ASTHMA 08/14/2007    Current outpatient prescriptions :fluticasone (VERAMYST) 27.5 MCG/SPRAY nasal spray, Place 2 sprays into the nose daily lansoprazole (PREVACID) 30 MG capsule, Take 30 mg by mouth daily., Disp: , Rfl: ;   levothyroxine (SYNTHROID, LEVOTHROID) 112 MCG tablet, Take 1 tablet (112 mcg total) by mouth daily., Disp: 30 tablet, Rfl: 7 albuterol (PROVENTIL HFA;VENTOLIN HFA) 108 (90 BASE) MCG/ACT inhaler, Inhale 2 puffs into the lungs every 6 (six) hours as needed for wheezing or shortness of breath., Disp: , Rfl: ;   ALPRAZolam (XANAX) 0.25 MG tablet, Take 1 tablet (0.25 mg total) by mouth at bedtime as needed for sleep., Disp: 30 tablet, Rfl: 5  Olopatadine HCl (PATADAY) 0.2 % SOLN, Place 1 drop into both eyes daily., Disp: , Rfl: ;     Review of Systems No fever chills or night sweats No weight loss over past several months Cardiology  evaluation per emergency room visit in April 2014 discovered no cardiac disease    Objective:   Physical Exam BP 120/74  Pulse 99  Temp(Src) 98 F (36.7 C) (Oral)  Resp 16  Ht 5' 2.5" (1.588 m)  Wt 132 lb 3.2 oz (59.966 kg)  BMI 23.78 kg/m2  SpO2 98%  HEENT clear  heart regular without murmur Lungs clear Abdomen soft/no organomegaly or masses/generally tender epigastrium, right upper quadrant, right middle quadrant and right lower quadrant/no rebound tenderness       Results for orders placed in visit on 06/28/12  POCT CBC      Result Value Range   WBC 7.9  4.6 - 10.2 K/uL   Lymph, poc 2.6  0.6 - 3.4   POC LYMPH PERCENT 33.3  10 - 50 %L   MID (cbc) 0.5  0 - 0.9   POC MID % 6.4  0 - 12 %M   POC Granulocyte 4.8  2 - 6.9   Granulocyte percent 60.3  37 - 80 %G   RBC 4.46  4.04 - 5.48 M/uL   Hemoglobin 13.7  12.2 - 16.2 g/dL   HCT, POC 40.9  81.1 - 47.9 %   MCV 97.5 (*) 80 - 97 fL   MCH, POC 30.7  27 - 31.2 pg   MCHC 31.5 (*) 31.8 - 35.4 g/dL   RDW, POC 91.4     Platelet Count, POC 245  142 - 424 K/uL   MPV 8.6  0 - 99.8 fL  POCT URINALYSIS  DIPSTICK      Result Value Range   Color, UA light yellow     Clarity, UA clear     Glucose, UA neg     Bilirubin, UA neg     Ketones, UA neg     Spec Grav, UA 1.010     Blood, UA small     pH, UA 7.0     Protein, UA neg     Urobilinogen, UA 0.2     Nitrite, UA neg     Leukocytes, UA Trace    POCT UA - MICROSCOPIC ONLY      Result Value Range   WBC, Ur, HPF, POC 0-1     RBC, urine, microscopic 0-1     Bacteria, U Microscopic neg     Mucus, UA neg     Epithelial cells, urine per micros neg     Crystals, Ur, HPF, POC neg     Casts, Ur, LPF, POC neg     Yeast, UA neg      Assessment & Plan:   problem #1 acute gastrointestinal illness with nausea vomiting and diarrhea plus pain and loss of appetite  Problem #2 chronic abdominal pain With long-term reflux and hiatal hernia  She wishes to pursue further GI consultation  to obtain endoscopy///she does not want to see Dr. Loreta Ave again  For the time being her recent symptoms will be treated with Carafate suspension and Librax

## 2012-06-29 LAB — AMYLASE: Amylase: 38 U/L (ref 0–105)

## 2012-06-29 LAB — LIPASE: Lipase: 25 U/L (ref 0–75)

## 2012-07-04 ENCOUNTER — Encounter: Payer: Self-pay | Admitting: Internal Medicine

## 2012-09-18 ENCOUNTER — Encounter (HOSPITAL_COMMUNITY): Payer: Self-pay | Admitting: Emergency Medicine

## 2012-09-18 ENCOUNTER — Other Ambulatory Visit: Payer: Self-pay

## 2012-09-18 ENCOUNTER — Ambulatory Visit (INDEPENDENT_AMBULATORY_CARE_PROVIDER_SITE_OTHER): Payer: BC Managed Care – PPO | Admitting: Internal Medicine

## 2012-09-18 ENCOUNTER — Emergency Department (HOSPITAL_COMMUNITY)
Admission: EM | Admit: 2012-09-18 | Discharge: 2012-09-19 | Disposition: A | Discharge status: Home / Self Care | Payer: BC Managed Care – PPO | Attending: Emergency Medicine | Admitting: Emergency Medicine

## 2012-09-18 ENCOUNTER — Ambulatory Visit: Payer: BC Managed Care – PPO

## 2012-09-18 ENCOUNTER — Emergency Department (HOSPITAL_COMMUNITY): Payer: BC Managed Care – PPO

## 2012-09-18 VITALS — BP 156/76 | HR 132 | Temp 98.2°F | Resp 16

## 2012-09-18 DIAGNOSIS — R079 Chest pain, unspecified: Secondary | ICD-10-CM

## 2012-09-18 DIAGNOSIS — K219 Gastro-esophageal reflux disease without esophagitis: Secondary | ICD-10-CM | POA: Insufficient documentation

## 2012-09-18 DIAGNOSIS — R0602 Shortness of breath: Secondary | ICD-10-CM

## 2012-09-18 DIAGNOSIS — F341 Dysthymic disorder: Secondary | ICD-10-CM | POA: Insufficient documentation

## 2012-09-18 DIAGNOSIS — IMO0002 Reserved for concepts with insufficient information to code with codable children: Secondary | ICD-10-CM | POA: Insufficient documentation

## 2012-09-18 DIAGNOSIS — R42 Dizziness and giddiness: Secondary | ICD-10-CM

## 2012-09-18 DIAGNOSIS — R069 Unspecified abnormalities of breathing: Secondary | ICD-10-CM

## 2012-09-18 DIAGNOSIS — Z9109 Other allergy status, other than to drugs and biological substances: Secondary | ICD-10-CM | POA: Insufficient documentation

## 2012-09-18 DIAGNOSIS — R002 Palpitations: Secondary | ICD-10-CM

## 2012-09-18 DIAGNOSIS — Z91040 Latex allergy status: Secondary | ICD-10-CM | POA: Insufficient documentation

## 2012-09-18 DIAGNOSIS — R61 Generalized hyperhidrosis: Secondary | ICD-10-CM | POA: Insufficient documentation

## 2012-09-18 DIAGNOSIS — E039 Hypothyroidism, unspecified: Secondary | ICD-10-CM | POA: Insufficient documentation

## 2012-09-18 DIAGNOSIS — Z79899 Other long term (current) drug therapy: Secondary | ICD-10-CM | POA: Insufficient documentation

## 2012-09-18 HISTORY — DX: Unspecified asthma, uncomplicated: J45.909

## 2012-09-18 LAB — POCT CBC
Granulocyte percent: 63.8 %G (ref 37–80)
HCT, POC: 40.3 % (ref 37.7–47.9)
Hemoglobin: 13.3 g/dL (ref 12.2–16.2)
Lymph, poc: 1.8 (ref 0.6–3.4)
MCH, POC: 31.3 pg — AB (ref 27–31.2)
MCHC: 33 g/dL (ref 31.8–35.4)
MCV: 94.9 fL (ref 80–97)
MID (cbc): 0.4 (ref 0–0.9)
MPV: 7.8 fL (ref 0–99.8)
POC Granulocyte: 3.9 (ref 2–6.9)
POC LYMPH PERCENT: 30.1 %L (ref 10–50)
POC MID %: 6.1 %M (ref 0–12)
Platelet Count, POC: 221 10*3/uL (ref 142–424)
RBC: 4.25 M/uL (ref 4.04–5.48)
RDW, POC: 13.2 %
WBC: 6.1 10*3/uL (ref 4.6–10.2)

## 2012-09-18 LAB — POCT I-STAT, CHEM 8
BUN: 14 mg/dL (ref 6–23)
Calcium, Ion: 1.21 mmol/L (ref 1.13–1.30)
Chloride: 106 mEq/L (ref 96–112)
Creatinine, Ser: 0.9 mg/dL (ref 0.50–1.10)
Glucose, Bld: 124 mg/dL — ABNORMAL HIGH (ref 70–99)
HCT: 41 % (ref 36.0–46.0)
Hemoglobin: 13.9 g/dL (ref 12.0–15.0)
Potassium: 4.2 mEq/L (ref 3.5–5.1)
Sodium: 142 mEq/L (ref 135–145)
TCO2: 26 mmol/L (ref 0–100)

## 2012-09-18 LAB — POCT I-STAT TROPONIN I: Troponin i, poc: 0.02 ng/mL (ref 0.00–0.08)

## 2012-09-18 LAB — POCT UA - MICROSCOPIC ONLY
Casts, Ur, LPF, POC: NEGATIVE
Crystals, Ur, HPF, POC: NEGATIVE
Epithelial cells, urine per micros: NEGATIVE
Mucus, UA: NEGATIVE
Yeast, UA: NEGATIVE

## 2012-09-18 LAB — POCT SEDIMENTATION RATE: POCT SED RATE: 30 mm/hr — AB (ref 0–22)

## 2012-09-18 LAB — POCT URINALYSIS DIPSTICK
Bilirubin, UA: NEGATIVE
Glucose, UA: NEGATIVE
Leukocytes, UA: NEGATIVE
Nitrite, UA: NEGATIVE
Spec Grav, UA: >=1.03
Urobilinogen, UA: 0.2
pH, UA: 5.5

## 2012-09-18 LAB — D-DIMER, QUANTITATIVE: D-Dimer, Quant: 0.69 ug/mL-FEU — ABNORMAL HIGH (ref 0.00–0.48)

## 2012-09-18 LAB — PRO B NATRIURETIC PEPTIDE: Pro B Natriuretic peptide (BNP): 51.9 pg/mL (ref 0–125)

## 2012-09-18 LAB — GLUCOSE, POCT (MANUAL RESULT ENTRY): POC Glucose: 107 mg/dl — AB (ref 70–99)

## 2012-09-18 MED ORDER — PIPERACILLIN-TAZOBACTAM 3.375 G IVPB: 3.3750 g | Freq: Once | INTRAVENOUS | Status: DC

## 2012-09-18 MED ORDER — IOHEXOL 350 MG/ML SOLN
100.0000 mL | Freq: Once | INTRAVENOUS | Status: AC | PRN
  Administered 2012-09-18: 100 mL via INTRAVENOUS

## 2012-09-18 MED ORDER — VANCOMYCIN HCL IN DEXTROSE 1-5 GM/200ML-% IV SOLN: 1000.0000 mg | Freq: Once | INTRAVENOUS | Status: DC

## 2012-09-18 NOTE — ED Notes (Addendum)
Reports feeling not well x 4 days, worse today with dyspnea.  Reports "feeling exhausted just walking across the room"   Went to urgent care, sent here due to changes in EKG.

## 2012-09-18 NOTE — Progress Notes (Signed)
Subjective:    Patient ID: Mary Rubio, female    DOB: 1945/09/06, 67 y.o.   MRN: [id number]  HPI Fatigue, sob, chest pressure, palpitations, worse sweating. No change in medicines no hx for tick exposure, no urinary sxs, not in the heat and no reason for heat exposure. This is not her anxiety. She has lost a lot of weight over last couple of months not intentionally.  Stat EKG has new ST depressions leads 2,3,F, lateral precordials Start O2, monitoring and labs   Review of Systems See echart    Objective:   Physical Exam  Constitutional: She is oriented to person, place, and time. She appears well-developed and well-nourished. She appears distressed.  HENT:  Head: Normocephalic.  Eyes: Conjunctivae and EOM are normal. Pupils are equal, round, and reactive to light.  Neck: Normal range of motion. Neck supple.  Cardiovascular: Regular rhythm, S1 normal, S2 normal and intact distal pulses.   No extrasystoles are present. Tachycardia present.  Exam reveals no gallop.   No murmur heard. Pulmonary/Chest: Effort normal and breath sounds normal. She exhibits tenderness.  Abdominal: Soft. She exhibits no mass. There is no tenderness.  Neurological: She is alert and oriented to person, place, and time. No cranial nerve deficit. She exhibits normal muscle tone. Coordination normal.  Skin: Rash noted.  Psychiatric: She has a normal mood and affect. Her behavior is normal.   [facility] reading (PRIMARY) by  Dr Perrin Maltese possible free air under diaphragm o left   610 pm bp 130/70 pulse 104 oximetry 99%  Results for orders placed in visit on 09/18/12  POCT CBC      Result Value Range   WBC 6.1  4.6 - 10.2 K/uL   Lymph, poc 1.8  0.6 - 3.4   POC LYMPH PERCENT 30.1  10 - 50 %L   MID (cbc) 0.4  0 - 0.9   POC MID % 6.1  0 - 12 %M   POC Granulocyte 3.9  2 - 6.9   Granulocyte percent 63.8  37 - 80 %G   RBC 4.25  4.04 - 5.48 M/uL   Hemoglobin 13.3  12.2 - 16.2 g/dL   HCT, POC 78.2   95.6 - 47.9 %   MCV 94.9  80 - 97 fL   MCH, POC 31.3 (*) 27 - 31.2 pg   MCHC 33.0  31.8 - 35.4 g/dL   RDW, POC 21.3     Platelet Count, POC 221  142 - 424 K/uL   MPV 7.8  0 - 99.8 fL  GLUCOSE, POCT (MANUAL RESULT ENTRY)      Result Value Range   POC Glucose 107 (*) 70 - 99 mg/dl  POCT UA - MICROSCOPIC ONLY      Result Value Range   WBC, Ur, HPF, POC 0-1     RBC, urine, microscopic 1-3     Bacteria, U Microscopic trace     Mucus, UA neg     Epithelial cells, urine per micros neg     Crystals, Ur, HPF, POC neg     Casts, Ur, LPF, POC neg     Yeast, UA neg    POCT URINALYSIS DIPSTICK      Result Value Range   Color, UA yellow     Clarity, UA cloudy\     Glucose, UA neg     Bilirubin, UA neg     Ketones, UA trace     Spec Grav, UA >=1.030  Blood, UA moderate     pH, UA 5.5     Protein, UA trace     Urobilinogen, UA 0.2     Nitrite, UA neg     Leukocytes, UA Negative     To ER by EMT    Assessment & Plan:   Chest pain/SOB/Diaphoresis/Palpitations/Abnormal ekg Anorexia/fatigue /weight loss

## 2012-09-18 NOTE — ED Provider Notes (Signed)
Patient presents with worsening fatigue over the last few weeks. She also had some increased shortness of breath on exertion over the last few days. She has pain in her epigastrium which she's attributed in the past to her hiatal hernia. She's had a cardiac catheterization within the last 6 months she did not show coronary disease. She has no evidence of pulmonary embolus. There is no evidence of pneumonia. She has had some tachycardia but this was noted in the past as well. She has lab work pending for RMSF and Lyme disease by her PMD.  Advised close f/u with her PMD.  Rolan Bucco, MD 09/18/12 [id number]

## 2012-09-18 NOTE — Progress Notes (Signed)
IV insertion attempt x1 unsuccessful. 

## 2012-09-18 NOTE — ED Provider Notes (Signed)
CSN: [id number]     Arrival date & time 09/18/12  1856 History   First MD Initiated Contact with Patient 09/18/12 1907     Chief Complaint  Patient presents with  . Shortness of Breath   (Consider location/radiation/quality/duration/timing/severity/associated sxs/prior Treatment) Patient is a 67 y.o. female presenting with shortness of breath. The history is provided by the patient. No language interpreter was used.  Shortness of Breath Severity:  Severe Onset quality:  Sudden Timing:  Constant Progression:  Worsening Chronicity:  New Context: activity   Relieved by:  Nothing Worsened by:  Exertion Ineffective treatments:  Rest Associated symptoms: diaphoresis   Associated symptoms: no abdominal pain, no chest pain, no cough, no fever, no headaches, no sore throat, no sputum production and no vomiting   Risk factors: no family hx of DVT, no hx of PE/DVT, no oral contraceptive use and no tobacco use     Past Medical History  Diagnosis Date  . Environmental allergies     Weekly allergy shots  . Depression   . GERD (gastroesophageal reflux disease)   . Hypothyroidism   . Anxiety   . Hyperlipidemia   . Hiatal hernia    Past Surgical History  Procedure Laterality Date  . Abdominal hysterectomy    . Cholecystectomy    . Bunionectomy    . Thumb surgery      for cyst  . Exploratory laparotomy      endometriosis  . Bartholin gland cyst excision    . Facial cyst      excision   Family History  Problem Relation Age of Onset  . Cancer Mother   . Hypertension Mother   . Cancer Father   . Dementia Father   . Heart disease Sister   . Thyroid disease Daughter   . Heart disease Maternal Grandfather   . Heart disease Paternal Grandmother   . Thyroid disease Paternal Grandfather   . Arrhythmia Father   . CAD Sister     MI/stent age 22, CABG age 85  . CAD      Several relatives on dad's side have had MIs/CABG   History  Substance Use Topics  . Smoking status: Never  Smoker   . Smokeless tobacco: Not on file  . Alcohol Use: No   OB History   Grav Para Term Preterm Abortions TAB SAB Ect Mult Living   2 2 2       1      Obstetric Comments   One miscarriage     Review of Systems  Constitutional: Positive for diaphoresis. Negative for fever.  HENT: Negative for congestion, sore throat and rhinorrhea.   Respiratory: Positive for shortness of breath. Negative for cough and sputum production.   Cardiovascular: Negative for chest pain.  Gastrointestinal: Negative for nausea, vomiting, abdominal pain and diarrhea.  Genitourinary: Negative for dysuria and hematuria.  Neurological: Positive for light-headedness. Negative for syncope and headaches.  All other systems reviewed and are negative.    Allergies  Amoxicillin-pot clavulanate; Crestor; Latex; Meperidine hcl; Morphine and related; Pentazocine lactate; Simvastatin; Codeine; and Morphine  Home Medications   Current Outpatient Rx  Name  Route  Sig  Dispense  Refill  . albuterol (PROVENTIL HFA;VENTOLIN HFA) 108 (90 BASE) MCG/ACT inhaler   Inhalation   Inhale 2 puffs into the lungs every 6 (six) hours as needed for wheezing or shortness of breath.         . ALPRAZolam (XANAX) 0.25 MG tablet   Oral  Take 1 tablet (0.25 mg total) by mouth at bedtime as needed for sleep.   30 tablet   5   . clidinium-chlordiazePOXIDE (LIBRAX) 2.5-5 MG per capsule   Oral   Take 1 capsule by mouth 4 (four) times daily -  before meals and at bedtime.   12 capsule   3   . fluticasone (VERAMYST) 27.5 MCG/SPRAY nasal spray   Nasal   Place 2 sprays into the nose daily.         . lansoprazole (PREVACID) 30 MG capsule   Oral   Take 30 mg by mouth daily.         Marland Kitchen levothyroxine (SYNTHROID, LEVOTHROID) 112 MCG tablet   Oral   Take 1 tablet (112 mcg total) by mouth daily.   30 tablet   7   . Olopatadine HCl (PATADAY) 0.2 % SOLN   Both Eyes   Place 1 drop into both eyes daily.         Marland Kitchen omeprazole  (PRILOSEC) 20 MG capsule   Oral   Take 1 capsule (20 mg total) by mouth daily.   21 capsule   0   . sucralfate (CARAFATE) 1 GM/10ML suspension   Oral   Take 10 mLs (1 g total) by mouth 4 (four) times daily.   420 mL   0    BP 127/65  Pulse 107  Temp(Src) 98.5 F (36.9 C) (Oral)  Resp 18  Ht 5\' 2"  (1.575 m)  Wt 134 lb (60.782 kg)  BMI 24.5 kg/m2  SpO2 100% Physical Exam  Nursing note and vitals reviewed. Constitutional: She is oriented to person, place, and time. She appears well-developed and well-nourished. No distress.  HENT:  Head: Normocephalic and atraumatic.  Eyes: EOM are normal. Pupils are equal, round, and reactive to light.  Neck: Normal range of motion. Neck supple.  Cardiovascular: Normal rate, regular rhythm, normal heart sounds and intact distal pulses.  Exam reveals no gallop and no friction rub.   No murmur heard. Pulmonary/Chest: Effort normal and breath sounds normal. No respiratory distress. She has no wheezes. She has no rales. She exhibits no tenderness.  Abdominal: Soft. She exhibits no distension. There is no tenderness. There is no rebound.  Musculoskeletal: Normal range of motion. She exhibits no edema and no tenderness.  Lymphadenopathy:    She has no cervical adenopathy.  Neurological: She is alert and oriented to person, place, and time.  Skin: She is not diaphoretic.    ED Course  Procedures (including critical care time) Labs Review Labs Reviewed  D-DIMER, QUANTITATIVE - Abnormal; Notable for the following:    D-Dimer, Quant 0.69 (*)    All other components within normal limits  POCT I-STAT, CHEM 8 - Abnormal; Notable for the following:    Glucose, Bld 124 (*)    All other components within normal limits  PRO B NATRIURETIC PEPTIDE  POCT I-STAT TROPONIN I   Imaging Review Dg Chest 2 View  09/18/2012   *RADIOLOGY REPORT*  Clinical Data: Short of breath  CHEST - 2 VIEW  Comparison: 06/26/2012  Findings: Mild hyperinflation.  Heart size  is normal.  Negative for heart failure.  Negative for pneumonia.  No significant pleural effusion.  IMPRESSION: Hyperinflation.  No acute cardiopulmonary abnormality.  Clinically significant discrepancy from primary report, if provided: None   Original Report Authenticated By: Janeece Riggers, M.D.    MDM   1. Shortness of breath    7:28 PM Pt is a 67 y.o.  female with pertinent PMHX of GERD, Hypothyroidism, anxiety, HLD, asthma who presents to the ED with shortness of breath. Pt presented with shortness of breath since 2-3PM today sudden onset. Deneis fevers, cough, congestion, runny nose. No sick contacts.No chest pain, shortness of breath worse with exertion, relieved with rest. Associated diaphoresis. Negative cath in April of 2014.  PE: No/history of immobilization, not on OCPs, not tachycardic, not hypoxic. No/history of PE/DVT/ No/hsitory of cancer actively treated.  On exam: AF, tachycaridc, not hypoxic.Lungs CTA. Plan for istat chem 8, i stat troponin, BNP and D-dimer. CBC and CMP performed at urgent care. No gross abnormalities noted.  Will also obtain d-dimer. Concerning clinical picture of possible PE. Doubt pneumonia or ptx, no fevers, non toxic appearing. No abnormal lungs sounds and normal CXR from urgent care.  EKG personally reviewed by myself showed sinus tachycardiaRate of 102, PR ms, QRS ms QT/QTC 344/47ms, normal axis, without evidence of new ischemia. Comparison showed , indication: shortness of breath  CXR PA/LAT for shortness of breath showed no pneumonia no ptx performed at Urgent care.  Review of labs: d-dimer elevated, troponin negative. No electrolyte abnormalities on chem 8, BNP negative. Will obtain CT PE study.  CT Pe study negative for Pe. Plan for discharge with close follow up with PCP. Possibly viral illness or anxiety. No wheezing doubt asthma. On re-eval pt feeling much better, tachycardia improved.  I have discussed the diagnosis/risks/treatment options with the  patient and believe the pt to be eligible for discharge home to follow-up with PCP. We also discussed returning to the ED immediately if new or worsening sx occur. We discussed the sx which are most concerning (e.g., worsening chest pain or shortness of breath) that necessitate immediate return. Any new prescriptions provided to the patient are listed below.   Discharge Medication List as of 09/18/2012 11:36 PM      The patient appears reasonably screened and/or stabilized for discharge and I doubt any other medical condition or other [facility] requiring further screening, evaluation or treatment in the ED at this time prior to discharge . Pt in agreement with discharge plan. Return precautions given. Pt discharged VSS   Labs, EKG and imaging reviewed by myself and considered in medical decision making if ordered.  Imaging interpreted by radiology. Pt was discussed with my attending, Dr. Nicole Cella, MD 09/19/12 [phone]

## 2012-09-18 NOTE — ED Notes (Signed)
Pt reports she is feeling better than she was earlier.

## 2012-09-19 LAB — COMPREHENSIVE METABOLIC PANEL
ALT: 19 U/L (ref 0–35)
AST: 18 U/L (ref 0–37)
Albumin: 4.5 g/dL (ref 3.5–5.2)
Alkaline Phosphatase: 67 U/L (ref 39–117)
BUN: 14 mg/dL (ref 6–23)
CO2: 25 mEq/L (ref 19–32)
Calcium: 9.7 mg/dL (ref 8.4–10.5)
Chloride: 104 mEq/L (ref 96–112)
Creat: 0.94 mg/dL (ref 0.50–1.10)
Glucose, Bld: 107 mg/dL — ABNORMAL HIGH (ref 70–99)
Potassium: 4.3 mEq/L (ref 3.5–5.3)
Sodium: 141 mEq/L (ref 135–145)
Total Bilirubin: 0.2 mg/dL — ABNORMAL LOW (ref 0.3–1.2)
Total Protein: 7.3 g/dL (ref 6.0–8.3)

## 2012-09-19 LAB — TSH: TSH: 0.299 u[IU]/mL — ABNORMAL LOW (ref 0.350–4.500)

## 2012-09-20 LAB — B. BURGDORFI ANTIBODIES: B burgdorferi Ab IgG+IgM: 0.23 {ISR}

## 2012-09-20 LAB — ROCKY MTN SPOTTED FVR AB, IGM-BLOOD: ROCKY MTN SPOTTED FEVER, IGM: 0.15 IV

## 2012-09-20 NOTE — ED Provider Notes (Signed)
I saw and evaluated the patient, reviewed the resident's note and I agree with the findings and plan. Pt with episode of SOB, no cp, no pleuritic symptoms, CT neg for PE, no evidence of pneumonia/ptx, has had recent negative cath, so low risk for ACS.  Rolan Bucco, MD 09/20/12 [phone]

## 2012-09-22 ENCOUNTER — Encounter: Payer: Self-pay | Admitting: Family Medicine

## 2012-09-22 LAB — EHRLICHIA ANTIBODY PANEL
E chaffeensis (HGE) Ab, IgG: <1:64 {titer}
E chaffeensis (HGE) Ab, IgM: <1:20 {titer}

## 2012-09-24 ENCOUNTER — Ambulatory Visit (INDEPENDENT_AMBULATORY_CARE_PROVIDER_SITE_OTHER): Payer: BC Managed Care – PPO | Admitting: Internal Medicine

## 2012-09-24 VITALS — BP 110/64 | HR 76 | Temp 98.1°F | Resp 16 | Ht 64.0 in | Wt 132.2 lb

## 2012-09-24 DIAGNOSIS — K219 Gastro-esophageal reflux disease without esophagitis: Secondary | ICD-10-CM

## 2012-09-24 DIAGNOSIS — E039 Hypothyroidism, unspecified: Secondary | ICD-10-CM

## 2012-09-24 DIAGNOSIS — R5381 Other malaise: Secondary | ICD-10-CM

## 2012-09-24 DIAGNOSIS — R1310 Dysphagia, unspecified: Secondary | ICD-10-CM

## 2012-09-24 DIAGNOSIS — R634 Abnormal weight loss: Secondary | ICD-10-CM

## 2012-09-24 DIAGNOSIS — R110 Nausea: Secondary | ICD-10-CM

## 2012-09-24 MED ORDER — RANITIDINE HCL 150 MG PO TABS: 150.0000 mg | ORAL_TABLET | Freq: Two times a day (BID) | ORAL | Status: DC

## 2012-09-24 MED ORDER — LEVOTHYROXINE SODIUM 88 MCG PO TABS: 88.0000 ug | ORAL_TABLET | Freq: Every day | ORAL | Status: DC

## 2012-09-24 NOTE — Progress Notes (Signed)
  Subjective:    Patient ID: Mary Rubio, female    DOB: Apr 18, 1945, 67 y.o.   MRN: [id number]  HPI  67 YO female patient returns to the clinic today to follow up with Dr. Perrin Maltese. Patient was in the hospital for 8 hours Sunday night. Patient reports she is feeling fatigued. She has been short of breath 3 times this week since her hospital visit. She has used her inhaler each time. She states this is the first time she has had to use it since getting it in April.   Angiogram was completed in the hospital. She was told that she did not have a blood clot in her lungs. They told her she has an "electrical problem" with her heart.  Patient reports that she is very tired of the hospital visits and unknown diagnosis. She stresses that she would like to figure out what is going on with her. She is lacking energy all the time. Last TSH was low, she is clinically hyperthyroid and we need to lower the dose of synthroid.  Review of Systems  Constitutional: Positive for activity change and fatigue.  Respiratory: Positive for apnea, chest tightness and shortness of breath.   Gastrointestinal: Positive for nausea and abdominal pain.  Neurological: Positive for dizziness, weakness and light-headedness.  Reflux and dysphagia     Objective:   Physical Exam  Constitutional: She is oriented to person, place, and time. She appears well-developed and well-nourished.  HENT:  Head: Normocephalic.  Eyes: EOM are normal. Pupils are equal, round, and reactive to light. No scleral icterus.  Neck: Normal range of motion. Neck supple. No thyromegaly present.  Cardiovascular: Normal rate, regular rhythm and normal heart sounds.   Pulmonary/Chest: Effort normal and breath sounds normal.  Abdominal: Soft. Bowel sounds are normal. There is tenderness.  Musculoskeletal: Normal range of motion.  Lymphadenopathy:    She has no cervical adenopathy.    She has no axillary adenopathy.       Right: No  inguinal and no supraclavicular adenopathy present.       Left: No inguinal adenopathy present.  Neurological: She is alert and oriented to person, place, and time. She exhibits normal muscle tone. Coordination normal.  Skin: No rash noted.  Psychiatric: She has a normal mood and affect. Her behavior is normal.    Sed rate 30 TSH .299     Assessment & Plan:  GERD/Dysphagia/weight loss Low TSH Reduce dose TSH to 87 mcg Refer to [facility] for Endoscopy

## 2012-09-24 NOTE — Patient Instructions (Signed)
Hyperthyroidism The thyroid is a large gland located in the lower front part of your neck. The thyroid helps control metabolism. Metabolism is how your body uses food. It controls metabolism with the hormone thyroxine. When the thyroid is overactive, it produces too much hormone. When this happens, these following problems may occur:   Nervousness  Heat intolerance  Weight loss (in spite of increase food intake)  Diarrhea  Change in hair or skin texture  Palpitations (heart skipping or having extra beats)  Tachycardia (rapid heart rate)  Loss of menstruation (amenorrhea)  Shaking of the hands CAUSES  Grave's Disease (the immune system attacks the thyroid gland). This is the most common cause.  Inflammation of the thyroid gland.  Tumor (usually benign) in the thyroid gland or elsewhere.  Excessive use of thyroid medications (both prescription and 'natural').  Excessive ingestion of Iodine. DIAGNOSIS  To prove hyperthyroidism, your caregiver may do blood tests and ultrasound tests. Sometimes the signs are hidden. It may be necessary for your caregiver to watch this illness with blood tests, either before or after diagnosis and treatment. TREATMENT Short-term treatment There are several treatments to control symptoms. Drugs called beta blockers may give some relief. Drugs that decrease hormone production will provide temporary relief in many people. These measures will usually not give permanent relief. Definitive therapy There are treatments available which can be discussed between you and your caregiver which will permanently treat the problem. These treatments range from surgery (removal of the thyroid), to the use of radioactive iodine (destroys the thyroid by radiation), to the use of antithyroid drugs (interfere with hormone synthesis). The first two treatments are permanent and usually successful. They most often require hormone replacement therapy for life. This is because  it is impossible to remove or destroy the exact amount of thyroid required to make a person euthyroid (normal). HOME CARE INSTRUCTIONS  See your caregiver if the problems you are being treated for get worse. Examples of this would be the problems listed above. SEEK MEDICAL CARE IF: Your general condition worsens. MAKE SURE YOU:   Understand these instructions.  Will watch your condition.  Will get help right away if you are not doing well or get worse. Document Released: 01/05/2005 Document Revised: 03/30/2011 Document Reviewed: 05/19/2006 [facility] Patient Information 2014 [place], [place]. Barrett's Esophagus Barrett's esophagus occurs when the lining of the esophagus is damaged. The esophagus is the tube that carries food from the mouth to the stomach. With Barrett's esophagus, the lining of the esophagus gets replaced by material that is similar to the lining in the intestines. This process is called intestinal metaplasia. A small number of people with Barrett's esophagus develop esophageal cancer. CAUSES  The exact cause of Barrett's esophagus is unknown. SYMPTOMS  Most people with Barrett's esophagus do not have symptoms. However, many patients also have gastroesophageal reflux disease (GERD). GERD can cause heartburn, trouble swallowing, and a dry cough. DIAGNOSIS Barrett's esophagus is diagnosed by an exam called upper gastrointestinal endoscopy. A thin, flexible tube (endoscope) is passed down the esophagus. The endoscope has a light and camera on the end. Your caregiver uses the endoscope to view the inside of the esophagus. A tissue sample may also be taken and examined under a microscope (biopsy). If cancer cells are found during the biopsy, this condition is called dysplasia. TREATMENT  If you have no dysplasia or low-grade dysplasia, your caregiver may recommend no treatment or only taking medicines to treat GERD. Sometimes, taking acid-blocking drugs to treat GERD  helps improve  the tissue affected by Barrett's esophagus. Your caregiver may also recommend periodic esophageal exams. If you have high-grade dysplasia, treatment may include removing the damaged parts of the esophagus. This can be done by heating, freezing, or surgically removing the tissue. In some cases, surgery may be done to remove most of the esophagus. The stomach is then attached to the remaining portion of the esophagus. HOME CARE INSTRUCTIONS  Take acid-blocking drugs for GERD if recommended by your caregiver.  Keep all follow-up appointments as directed by your caregiver. You may need periodic esophageal exams. SEEK IMMEDIATE MEDICAL CARE IF:  You have chest pain.  You have trouble swallowing.  You vomit blood or material that looks like coffee grounds.  Your stools are bright red or dark. Document Released: 03/28/2003 Document Revised: 07/07/2011 Document Reviewed: 03/17/2011 [facility] Patient Information 2014 [place], [place].

## 2012-10-18 ENCOUNTER — Other Ambulatory Visit: Payer: Self-pay | Admitting: Gastroenterology

## 2012-10-21 ENCOUNTER — Encounter (HOSPITAL_COMMUNITY): Payer: Self-pay | Admitting: *Deleted

## 2012-10-21 ENCOUNTER — Encounter (HOSPITAL_COMMUNITY)
Admission: RE | Disposition: A | Discharge status: Home / Self Care | Payer: Self-pay | Source: Ambulatory Visit | Attending: Gastroenterology

## 2012-10-21 ENCOUNTER — Ambulatory Visit (HOSPITAL_COMMUNITY)
Admission: RE | Admit: 2012-10-21 | Discharge: 2012-10-21 | Disposition: A | Discharge status: Home / Self Care | Payer: BC Managed Care – PPO | Source: Ambulatory Visit | Attending: Gastroenterology | Admitting: Gastroenterology

## 2012-10-21 DIAGNOSIS — R079 Chest pain, unspecified: Secondary | ICD-10-CM | POA: Insufficient documentation

## 2012-10-21 DIAGNOSIS — E785 Hyperlipidemia, unspecified: Secondary | ICD-10-CM | POA: Insufficient documentation

## 2012-10-21 DIAGNOSIS — E039 Hypothyroidism, unspecified: Secondary | ICD-10-CM | POA: Insufficient documentation

## 2012-10-21 DIAGNOSIS — K219 Gastro-esophageal reflux disease without esophagitis: Secondary | ICD-10-CM | POA: Insufficient documentation

## 2012-10-21 DIAGNOSIS — R110 Nausea: Secondary | ICD-10-CM | POA: Insufficient documentation

## 2012-10-21 DIAGNOSIS — K449 Diaphragmatic hernia without obstruction or gangrene: Secondary | ICD-10-CM | POA: Insufficient documentation

## 2012-10-21 DIAGNOSIS — R1033 Periumbilical pain: Secondary | ICD-10-CM | POA: Insufficient documentation

## 2012-10-21 HISTORY — PX: ESOPHAGOGASTRODUODENOSCOPY: SHX5428

## 2012-10-21 SURGERY — EGD (ESOPHAGOGASTRODUODENOSCOPY)
Anesthesia: Moderate Sedation

## 2012-10-21 MED ORDER — BUTAMBEN-TETRACAINE-BENZOCAINE 2-2-14 % EX AERO
INHALATION_SPRAY | CUTANEOUS | Status: DC | PRN
  Administered 2012-10-21: 2 via TOPICAL

## 2012-10-21 MED ORDER — SODIUM CHLORIDE 0.9 % IV SOLN
INTRAVENOUS | Status: DC
  Administered 2012-10-21: 500 mL via INTRAVENOUS

## 2012-10-21 MED ORDER — MIDAZOLAM HCL 10 MG/2ML IJ SOLN
INTRAMUSCULAR | Status: AC
  Filled 2012-10-21: qty 4

## 2012-10-21 MED ORDER — FENTANYL CITRATE 0.05 MG/ML IJ SOLN
INTRAMUSCULAR | Status: AC
  Filled 2012-10-21: qty 4

## 2012-10-21 MED ORDER — FENTANYL CITRATE 0.05 MG/ML IJ SOLN
INTRAMUSCULAR | Status: DC | PRN
  Administered 2012-10-21: 25 ug via INTRAVENOUS
  Administered 2012-10-21: 12.5 ug via INTRAVENOUS

## 2012-10-21 MED ORDER — DIPHENHYDRAMINE HCL 50 MG/ML IJ SOLN
INTRAMUSCULAR | Status: AC
  Filled 2012-10-21: qty 1

## 2012-10-21 MED ORDER — MIDAZOLAM HCL 10 MG/2ML IJ SOLN
INTRAMUSCULAR | Status: DC | PRN
  Administered 2012-10-21 (×3): 2 mg via INTRAVENOUS
  Administered 2012-10-21: 1 mg via INTRAVENOUS

## 2012-10-21 NOTE — Op Note (Signed)
[facility] [address] [place], [address]   OPERATIVE PROCEDURE REPORT  PATIENT: Mary, Rubio  MR#: [id number] BIRTHDATE: May 19, 1945  GENDER: Female ENDOSCOPIST: Jeani Hawking, MD ASSISTANT:   Beryle Beams, technician and Karoline Caldwell, RN, BSN PROCEDURE DATE: 10/21/2012 PROCEDURE:   EGD, diagnostic ASA CLASS:   Class III INDICATIONS:Chest pain. MEDICATIONS: Versed 7 mg IV and Fentanyl 37.5 mcg IV TOPICAL ANESTHETIC:   Cetacaine Spray  DESCRIPTION OF PROCEDURE:   After the risks benefits and alternatives of the procedure were thoroughly explained, informed consent was obtained.  The Pentax Gastroscope [id number]  endoscope was introduced through the mouth  and advanced to the second portion of the duodenum Without limitations.      The instrument was slowly withdrawn as the mucosa was fully examined.      FINDINGS: A large 5 cm hiatal hernia was identified.  No evidence of esophagitis or stricture.  No other abnromalities were ntoed in the upper GI tract, i.e., polyps, masses, inflammation, ulcerations, erosions, or vascular abnormalities.          The scope was then withdrawn from the patient and the procedure terminated.  COMPLICATIONS: There were no complications. IMPRESSION: 1) 5 cm hiatal hernia.  RECOMMENDATIONS: 1) Consider manometry and pH impedence.  _______________________________ eSignedJeani Hawking, MD 10/21/2012 2:45 PM

## 2012-10-21 NOTE — H&P (Signed)
  Caroleen Hamman Nance Fitz-Gerald HPI: The patient has a history of chest pain and periumbilical pain.  She was evaluated by Dr. Loreta Ave for her complaints.  In the past she was evaluated by Cardiology for her chest pain, but no cardiac source was identified.  When she was in her 20's she underwent an EGD by Dr. Corinda Gubler with findings of a hiatal hernia.  She has issues with nausea, but no vomiting.  No issues with dysphagia or odynophagia.  Past Medical History  Diagnosis Date  . Environmental allergies     Weekly allergy shots  . Depression   . GERD (gastroesophageal reflux disease)   . Hypothyroidism   . Anxiety   . Hyperlipidemia   . Hiatal hernia   . Asthma     Past Surgical History  Procedure Laterality Date  . Abdominal hysterectomy    . Cholecystectomy    . Bunionectomy    . Thumb surgery      for cyst  . Exploratory laparotomy      endometriosis  . Bartholin gland cyst excision    . Facial cyst      excision    Family History  Problem Relation Age of Onset  . Cancer Mother   . Hypertension Mother   . Cancer Father   . Dementia Father   . Heart disease Sister   . Thyroid disease Daughter   . Heart disease Maternal Grandfather   . Heart disease Paternal Grandmother   . Thyroid disease Paternal Grandfather   . Arrhythmia Father   . CAD Sister     MI/stent age 73, CABG age 27  . CAD      Several relatives on dad's side have had MIs/CABG    Social History:  reports that she has never smoked. She does not have any smokeless tobacco history on file. She reports that she does not drink alcohol or use illicit drugs.  Allergies:  Allergies  Allergen Reactions  . Amoxicillin-Pot Clavulanate Nausea And Vomiting    Pt reports this is an error.  . Crestor [Rosuvastatin]     Myalgias  . Latex Other (See Comments)    Canker sores in mouth (discovered by dentist)  . Meperidine Hcl Other (See Comments)    Does not remember   . Morphine And Related Itching    Has tolerated  small doses of hydrocodone  . Pentazocine Lactate Nausea And Vomiting    Sick on stomach  . Simvastatin Other (See Comments)    Muscle aches   . Codeine Rash    Has tolerated small doses of hydrocodone  . Morphine Rash    Medications:  Scheduled:  Continuous: . sodium chloride 500 mL (10/21/12 1358)    No results found for this or any previous visit (from the past 24 hour(s)).   No results found.  ROS:  As stated above in the HPI otherwise negative.  Blood pressure 134/84, pulse 92, temperature 98.3 F (36.8 C), resp. rate 19, height 5\' 4"  (1.626 m), weight 132 lb (59.875 kg), SpO2 99.00%.    PE: Gen: NAD, Alert and Oriented HEENT:  [place]/AT, EOMI Neck: Supple, no LAD Lungs: CTA Bilaterally CV: RRR without M/G/R ABM: Soft, NTND, +BS Ext: No C/C/E  Assessment/Plan: 1) Chest pain and periumbilical abdominal pain.  Plan: 1) EGD.  Idrissa Beville D 10/21/2012, 2:24 PM

## 2012-10-24 ENCOUNTER — Encounter (HOSPITAL_COMMUNITY): Payer: Self-pay | Admitting: Gastroenterology

## 2012-11-09 ENCOUNTER — Encounter: Payer: Self-pay | Admitting: Internal Medicine

## 2012-11-09 ENCOUNTER — Ambulatory Visit (INDEPENDENT_AMBULATORY_CARE_PROVIDER_SITE_OTHER): Payer: BC Managed Care – PPO | Admitting: Internal Medicine

## 2012-11-09 VITALS — BP 102/60 | HR 81 | Temp 97.6°F | Resp 16 | Ht 63.5 in | Wt 133.8 lb

## 2012-11-09 DIAGNOSIS — G4700 Insomnia, unspecified: Secondary | ICD-10-CM

## 2012-11-09 DIAGNOSIS — K219 Gastro-esophageal reflux disease without esophagitis: Secondary | ICD-10-CM

## 2012-11-09 DIAGNOSIS — E039 Hypothyroidism, unspecified: Secondary | ICD-10-CM

## 2012-11-09 DIAGNOSIS — Z23 Encounter for immunization: Secondary | ICD-10-CM

## 2012-11-09 MED ORDER — ALPRAZOLAM 0.25 MG PO TABS: 0.2500 mg | ORAL_TABLET | Freq: Two times a day (BID) | ORAL | Status: DC | PRN

## 2012-11-09 NOTE — Progress Notes (Signed)
  Subjective:    Patient ID: Mary Rubio, female    DOB: 12/28/1945, 67 y.o.   MRN: [id number]  HPI Patient comes in today to talk about her thyroid and other issues.   1- Hypothyroidism- Has been on 88 mcg. of synthroid for 1 month after having TSH 0.299. Didn't take synthroid for 2 weeks because of her severe shake and anxiety. No more heart racing or anxiety. Feels fatigued, but improved. Sweating since 2009. Worse with hyperthyroid, better now. Feels hot and sweats.  2-GERD- Still having a lot of reflux. Watching what she eats. Taking ranitidine 150 mg po bid with better results than with Nexuum. Sleeps sitting up.   3-Asthma- Has not needed any albuterol for wheezing or SOB.  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 Patient reports strong support systems with therapist, great friends, bible study group and strong church community.  Review of Systems  Constitutional: Negative for fever, activity change, appetite change and unexpected weight change.  Eyes: Negative for visual disturbance.  Respiratory: Negative for shortness of breath and wheezing.   Cardiovascular: Negative for chest pain, palpitations and leg swelling.       (Cardiac w/u wnl and ? Caused by excess thy hor repl)  Musculoskeletal: Negative.   Psychiatric/Behavioral: Negative for behavioral problems and dysphoric mood.       Objective:   Physical Exam  Constitutional: She is oriented to person, place, and time. She appears well-developed and well-nourished.  Eyes: Conjunctivae and EOM are normal. Pupils are equal, round, and reactive to light.  Neck: No thyromegaly present.  Cardiovascular: Normal rate and regular rhythm.   Pulmonary/Chest: Effort normal.  Musculoskeletal: Normal range of motion. She exhibits no edema.  Lymphadenopathy:    She has no cervical adenopathy.  Neurological: She is alert and oriented to person, place, and time. No cranial  nerve deficit.  Psychiatric: She has a normal mood and affect. Her behavior is normal. Judgment and thought content normal.      Assessment & Plan:  Need for prophylactic vaccination and inoculation against influenza - Plan: Flu Vaccine QUAD 36+ mos IM  GERD (gastroesophageal reflux disease)  Hypothyroidism  Insomnia, unspecified  Meds ordered this encounter  Medications  . ALPRAZolam (XANAX) 0.25 MG tablet    Sig: Take 1 tablet (0.25 mg total) by mouth 2 (two) times daily as needed.    Dispense:  60 tablet    Refill:  1   1- hypothyroid- follow up in 2 months and check TSH.  2- GERD- continue ranitidine 150 mg  Po bid, discussed manometry, fundoplication. Patient had recent EDG by Dr. Elnoria Howard. Normal esophagus, large hiatal hernia. 3-asthma- albuterol if needed. Previous PFTs normal. 4- anxiety- patient with good support systems in place. Requesting Xanax refill. Had prescription for 30 one year ago. Will refill.   I have completed the patient encounter in its entirety as documented by the FNP/DG with editing by me where necessary. Ermagene Saidi P. Merla Riches, M.D.

## 2012-12-02 DIAGNOSIS — H40029 Open angle with borderline findings, high risk, unspecified eye: Secondary | ICD-10-CM | POA: Diagnosis not present

## 2012-12-28 ENCOUNTER — Telehealth: Payer: Self-pay

## 2012-12-28 ENCOUNTER — Ambulatory Visit (INDEPENDENT_AMBULATORY_CARE_PROVIDER_SITE_OTHER): Payer: Medicare Other | Admitting: Family Medicine

## 2012-12-28 VITALS — BP 117/76 | HR 80 | Temp 98.0°F | Resp 16 | Ht 63.0 in | Wt 134.2 lb

## 2012-12-28 DIAGNOSIS — J029 Acute pharyngitis, unspecified: Secondary | ICD-10-CM

## 2012-12-28 DIAGNOSIS — R634 Abnormal weight loss: Secondary | ICD-10-CM

## 2012-12-28 DIAGNOSIS — E039 Hypothyroidism, unspecified: Secondary | ICD-10-CM

## 2012-12-28 DIAGNOSIS — R5381 Other malaise: Secondary | ICD-10-CM

## 2012-12-28 LAB — POCT CBC
Granulocyte percent: 56.1 %G (ref 37–80)
HCT, POC: 45.8 % (ref 37.7–47.9)
Hemoglobin: 14.4 g/dL (ref 12.2–16.2)
Lymph, poc: 2.2 (ref 0.6–3.4)
MCH, POC: 30.3 pg (ref 27–31.2)
MCHC: 31.4 g/dL — AB (ref 31.8–35.4)
MCV: 96.3 fL (ref 80–97)
MID (cbc): 0.4 (ref 0–0.9)
MPV: 7.8 fL (ref 0–99.8)
POC Granulocyte: 3.3 (ref 2–6.9)
POC LYMPH PERCENT: 37.2 %L (ref 10–50)
POC MID %: 6.7 %M (ref 0–12)
Platelet Count, POC: 243 10*3/uL (ref 142–424)
RBC: 4.76 M/uL (ref 4.04–5.48)
RDW, POC: 13.3 %
WBC: 5.9 10*3/uL (ref 4.6–10.2)

## 2012-12-28 LAB — TSH: TSH: 9.188 u[IU]/mL — ABNORMAL HIGH (ref 0.350–4.500)

## 2012-12-28 LAB — POCT RAPID STREP A (OFFICE): Rapid Strep A Screen: NEGATIVE

## 2012-12-28 MED ORDER — AMOXICILLIN 875 MG PO TABS: 875.0000 mg | ORAL_TABLET | Freq: Two times a day (BID) | ORAL | Status: DC

## 2012-12-28 MED ORDER — IPRATROPIUM BROMIDE 0.03 % NA SOLN: 2.0000 | Freq: Four times a day (QID) | NASAL | Status: DC

## 2012-12-28 NOTE — Telephone Encounter (Signed)
Spoke with pt, she cannot swallow the Amoxicillin tablets and would like something else sent into [facility] [address].

## 2012-12-28 NOTE — Telephone Encounter (Signed)
Ok- called pharm and we will sub in 2 tsp of the 4/500 liquid, qs for BID 10 days. Called pt and let her know

## 2012-12-28 NOTE — Patient Instructions (Signed)
Use the amoxicillin as directed for possible strep. Try the nasal spray for ST.   I will get in touch with your labs.   If you are getting worse please let me know

## 2012-12-28 NOTE — Progress Notes (Addendum)
[facility], [place] [address] [place] [address] [phone]  Date:  12/28/2012   Name:  Mary Rubio   DOB:  September 04, 1945   MRN:  [id number]  PCP:  Tonye Pearson, MD    Chief Complaint: Sore Throat   History of Present Illness:  Mary Rubio is a 67 y.o. very pleasant female patient who presents with the following:  Over Thanksgiving she was exposed to strep throat.  Many people in her family had strep.  10 of her family members had it. However over the last couple of weeks she has not felt well.  Her sx seem to come and go- she had a ST on and off.  She will note chills and sweats, and feels tired.  Her throat does look red to her, and she feels a lump when she swallows. She notes as lot of drainage and her ST is worse in the am.    She does have a cough at night.  She has noted this for a couple of weeks.   He nose is "scaby" inside.   She also noted a frontal HA.  She is not aware of a fever.   No GI symptoms except for mild diarrhea over the past couple of days.    She does admit she has lost some weight as of late- maybe over the last 6- 12 months.  However chart review does not show weight loss- 08/2011 weight 135lbs  She is able to take amox- she gets GI sx with augmentin but no hives, swelling, etc.    Wt Readings from Last 3 Encounters:  12/28/12 134 lb 3.2 oz (60.873 kg)  11/09/12 133 lb 12.8 oz (60.691 kg)  10/21/12 132 lb (59.875 kg)     Patient Active Problem List   Diagnosis Date Noted  . Dysphagia 05/26/2012  . Esophageal reflux 05/26/2012  . Diaphragmatic hernia 05/26/2012  . Hypokalemia 05/13/2012  . Precordial pain 05/12/2012  . HYPOTHYROIDISM 08/14/2007  . HYPERLIPIDEMIA 08/14/2007  . ALLERGIC RHINITIS 08/14/2007  . ASTHMA 08/14/2007    Past Medical History  Diagnosis Date  . Environmental allergies     Weekly allergy shots  . Depression   . GERD (gastroesophageal reflux disease)   .  Hypothyroidism   . Anxiety   . Hyperlipidemia   . Hiatal hernia   . Asthma     Past Surgical History  Procedure Laterality Date  . Abdominal hysterectomy    . Cholecystectomy    . Bunionectomy    . Thumb surgery      for cyst  . Exploratory laparotomy      endometriosis  . Bartholin gland cyst excision    . Facial cyst      excision  . Esophagogastroduodenoscopy N/A 10/21/2012    Procedure: ESOPHAGOGASTRODUODENOSCOPY (EGD);  Surgeon: Theda Belfast, MD;  Location: [facility];  Service: Endoscopy;  Laterality: N/A;    History  Substance Use Topics  . Smoking status: Never Smoker   . Smokeless tobacco: Not on file  . Alcohol Use: No    Family History  Problem Relation Age of Onset  . Cancer Mother   . Hypertension Mother   . Cancer Father   . Dementia Father   . Heart disease Sister   . Thyroid disease Daughter   . Heart disease Maternal Grandfather   . Heart disease Paternal Grandmother   . Thyroid disease Paternal Grandfather   . Arrhythmia Father   .  CAD Sister     MI/stent age 95, CABG age 89  . CAD      Several relatives on dad's side have had MIs/CABG    Allergies  Allergen Reactions  . Amoxicillin-Pot Clavulanate Nausea And Vomiting    Pt reports this is an error.  . Crestor [Rosuvastatin]     Myalgias  . Latex Other (See Comments)    Canker sores in mouth (discovered by dentist)  . Meperidine Hcl Other (See Comments)    Does not remember   . Morphine And Related Itching    Has tolerated small doses of hydrocodone  . Pentazocine Lactate Nausea And Vomiting    Sick on stomach  . Simvastatin Other (See Comments)    Muscle aches   . Codeine Rash    Has tolerated small doses of hydrocodone  . Morphine Rash    Medication list has been reviewed and updated.  Current Outpatient Prescriptions on File Prior to Visit  Medication Sig Dispense Refill  . albuterol (PROVENTIL HFA;VENTOLIN HFA) 108 (90 BASE) MCG/ACT inhaler Inhale 2 puffs into the lungs  every 6 (six) hours as needed for wheezing or shortness of breath.      . ALPRAZolam (XANAX) 0.25 MG tablet Take 1 tablet (0.25 mg total) by mouth 2 (two) times daily as needed.  60 tablet  1  . Cholecalciferol (VITAMIN D PO) Take 1 tablet by mouth daily.      . fluticasone (VERAMYST) 27.5 MCG/SPRAY nasal spray Place 2 sprays into the nose daily.      Marland Kitchen levothyroxine (SYNTHROID, LEVOTHROID) 88 MCG tablet Take 1 tablet (88 mcg total) by mouth daily.  90 tablet  3  . Olopatadine HCl (PATADAY) 0.2 % SOLN Place 1 drop into both eyes daily.      . ranitidine (ZANTAC) 150 MG tablet Take 1 tablet (150 mg total) by mouth 2 (two) times daily.  180 tablet  3   No current facility-administered medications on file prior to visit.    Review of Systems:  As per HPI- otherwise negative.   Physical Examination: Filed Vitals:   12/28/12 1301  BP: 117/76  Pulse: 80  Temp: 98 F (36.7 C)  Resp: 16   Filed Vitals:   12/28/12 1301  Height: 5\' 3"  (1.6 m)  Weight: 134 lb 3.2 oz (60.873 kg)   Body mass index is 23.78 kg/(m^2). Ideal Body Weight: Weight in (lb) to have BMI = 25: 140.8  GEN: WDWN, NAD, Non-toxic, A & O x 3, looks well HEENT: Atraumatic, Normocephalic. Neck supple. No masses, No LAD.  Bilateral TM wnl, oropharynx normal.  PEERL,EOMI.   Ears and Nose: No external deformity. CV: RRR, No M/G/R. No JVD. No thrill. No extra heart sounds. PULM: CTA B, no wheezes, crackles, rhonchi. No retractions. No resp. distress. No accessory muscle use. ABD: S, NT, ND EXTR: No c/c/e NEURO Normal gait.  PSYCH: Normally interactive. Conversant. Not depressed or anxious appearing.  Calm demeanor.   Results for orders placed in visit on 12/28/12  POCT CBC      Result Value Range   WBC 5.9  4.6 - 10.2 K/uL   Lymph, poc 2.2  0.6 - 3.4   POC LYMPH PERCENT 37.2  10 - 50 %L   MID (cbc) 0.4  0 - 0.9   POC MID % 6.7  0 - 12 %M   POC Granulocyte 3.3  2 - 6.9   Granulocyte percent 56.1  37 - 80 %G  RBC  4.76  4.04 - 5.48 M/uL   Hemoglobin 14.4  12.2 - 16.2 g/dL   HCT, POC 40.9  81.1 - 47.9 %   MCV 96.3  80 - 97 fL   MCH, POC 30.3  27 - 31.2 pg   MCHC 31.4 (*) 31.8 - 35.4 g/dL   RDW, POC 91.4     Platelet Count, POC 243  142 - 424 K/uL   MPV 7.8  0 - 99.8 fL  POCT RAPID STREP A (OFFICE)      Result Value Range   Rapid Strep A Screen Negative  Negative    Assessment and Plan: Pharyngitis - Plan: POCT rapid strep A, Culture, Group A Strep, amoxicillin (AMOXIL) 875 MG tablet, ipratropium (ATROVENT) 0.03 % nasal spray  Loss of weight - Plan: TSH, Culture, Group A Strep  Other malaise and fatigue - Plan: POCT CBC, Culture, Group A Strep  Treat with amox for possible strep.  Await her other labs as above Possible weight loss- await TSH See patient instructions for more details.     Signed Shanda Bumps Copland

## 2012-12-29 MED ORDER — LEVOTHYROXINE SODIUM 100 MCG PO TABS: 100.0000 ug | ORAL_TABLET | Freq: Every day | ORAL | Status: DC

## 2012-12-29 NOTE — Addendum Note (Signed)
Addended by: Abbe Amsterdam C on: 12/29/2012 01:15 PM   Modules accepted: Orders, Medications

## 2012-12-30 LAB — CULTURE, GROUP A STREP: Organism ID, Bacteria: NORMAL

## 2013-01-27 DIAGNOSIS — J301 Allergic rhinitis due to pollen: Secondary | ICD-10-CM | POA: Diagnosis not present

## 2013-01-27 DIAGNOSIS — H1045 Other chronic allergic conjunctivitis: Secondary | ICD-10-CM | POA: Diagnosis not present

## 2013-01-27 DIAGNOSIS — J019 Acute sinusitis, unspecified: Secondary | ICD-10-CM | POA: Diagnosis not present

## 2013-01-27 DIAGNOSIS — J3089 Other allergic rhinitis: Secondary | ICD-10-CM | POA: Diagnosis not present

## 2013-01-27 DIAGNOSIS — R0602 Shortness of breath: Secondary | ICD-10-CM | POA: Diagnosis not present

## 2013-03-08 ENCOUNTER — Ambulatory Visit (INDEPENDENT_AMBULATORY_CARE_PROVIDER_SITE_OTHER): Payer: Medicare Other | Admitting: Internal Medicine

## 2013-03-08 ENCOUNTER — Encounter: Payer: Self-pay | Admitting: Internal Medicine

## 2013-03-08 VITALS — BP 128/60 | HR 100 | Temp 97.9°F | Resp 18 | Ht 62.5 in | Wt 131.0 lb

## 2013-03-08 DIAGNOSIS — E039 Hypothyroidism, unspecified: Secondary | ICD-10-CM

## 2013-03-08 DIAGNOSIS — R61 Generalized hyperhidrosis: Secondary | ICD-10-CM | POA: Diagnosis not present

## 2013-03-08 DIAGNOSIS — Z1231 Encounter for screening mammogram for malignant neoplasm of breast: Secondary | ICD-10-CM | POA: Diagnosis not present

## 2013-03-08 DIAGNOSIS — IMO0001 Reserved for inherently not codable concepts without codable children: Secondary | ICD-10-CM

## 2013-03-08 DIAGNOSIS — E041 Nontoxic single thyroid nodule: Secondary | ICD-10-CM

## 2013-03-08 DIAGNOSIS — R634 Abnormal weight loss: Secondary | ICD-10-CM | POA: Diagnosis not present

## 2013-03-08 DIAGNOSIS — R002 Palpitations: Secondary | ICD-10-CM | POA: Diagnosis not present

## 2013-03-08 LAB — CBC WITH DIFFERENTIAL/PLATELET
Basophils Absolute: 0.1 10*3/uL (ref 0.0–0.1)
Basophils Relative: 1 % (ref 0–1)
Eosinophils Absolute: 0.1 10*3/uL (ref 0.0–0.7)
Eosinophils Relative: 2 % (ref 0–5)
HCT: 39.6 % (ref 36.0–46.0)
Hemoglobin: 13.8 g/dL (ref 12.0–15.0)
Lymphocytes Relative: 37 % (ref 12–46)
Lymphs Abs: 2.3 10*3/uL (ref 0.7–4.0)
MCH: 31.2 pg (ref 26.0–34.0)
MCHC: 34.8 g/dL (ref 30.0–36.0)
MCV: 89.6 fL (ref 78.0–100.0)
Monocytes Absolute: 0.4 10*3/uL (ref 0.1–1.0)
Monocytes Relative: 7 % (ref 3–12)
Neutro Abs: 3.3 10*3/uL (ref 1.7–7.7)
Neutrophils Relative %: 53 % (ref 43–77)
Platelets: 221 10*3/uL (ref 150–400)
RBC: 4.42 MIL/uL (ref 3.87–5.11)
RDW: 13.9 % (ref 11.5–15.5)
WBC: 6.2 10*3/uL (ref 4.0–10.5)

## 2013-03-08 LAB — COMPLETE METABOLIC PANEL WITH GFR
ALT: 10 U/L (ref 0–35)
AST: 15 U/L (ref 0–37)
Albumin: 4.5 g/dL (ref 3.5–5.2)
Alkaline Phosphatase: 53 U/L (ref 39–117)
BUN: 14 mg/dL (ref 6–23)
CO2: 31 mEq/L (ref 19–32)
Calcium: 9.4 mg/dL (ref 8.4–10.5)
Chloride: 103 mEq/L (ref 96–112)
Creat: 0.91 mg/dL (ref 0.50–1.10)
GFR, Est African American: 77 mL/min
GFR, Est Non African American: 66 mL/min
Glucose, Bld: 110 mg/dL — ABNORMAL HIGH (ref 70–99)
Potassium: 3.9 mEq/L (ref 3.5–5.3)
Sodium: 142 mEq/L (ref 135–145)
Total Bilirubin: 0.2 mg/dL (ref 0.2–1.2)
Total Protein: 7.3 g/dL (ref 6.0–8.3)

## 2013-03-08 LAB — SEDIMENTATION RATE: Sed Rate: 12 mm/hr (ref 0–22)

## 2013-03-08 LAB — HEPATITIS C ANTIBODY: HCV Ab: NEGATIVE

## 2013-03-08 NOTE — Progress Notes (Signed)
Subjective:    Patient ID: Mary Rubio, female    DOB: 11-09-1945, 68 y.o.   MRN: [id number] This chart was scribed for [name], MD by Rolanda Lundborg, ED Scribe. This patient was seen in room 25 and the patient's care was started at 2:15 PM.  Chief Complaint  Patient presents with  . Follow-up    DISCUSS THYROID   HPI HPI Comments: Mary Rubio is a 68 y.o. female who [facility] the [facility] for [facility] follow up. She reports intermittent tachycardia and SOB since starting the new thyroid medication. Her BP has been normal. She has had these symptoms for many years and has been evaluated by cardiology who found no problem. Pulmonary embolus has been ruled out. She relates her symptoms to a lack of ability by endocrine to control her thyroid function many years ago. Her symptoms do not seem to correlate with her TSH were free T4 levels in the years that she has been followed here after she gave up on endocrinology. She describes no particular sleep disturbance.  She saw GI for stabbing epigastric area and lower quadrant abdominal pain  after eating-- onset 2010. Had a normal endoscopy. She states she keeps losing weight because she does not want to eat. She reports intermittent diarrhea. She states she gets some relief from [employer]. She is very frustrated at the lack of a diagnosis.  She states she is no longer taking Xanax and antidepressants and has not had a panic attack in years. She states her sleeping is better than it used to be. She gets 6-7 hours per night. She denies daytime somnolence. She cares for her father who has dementia but feels it is much easier than caring for her mother who died recently after a bout of lung cancer. She says she is much less stressed now.  Her most distressing symptom is excessive sweating. She sweats at night to the point of needing to change her bed clothes. She may break into a sweat during the  daytime while sitting still. She describes no cold or heat intolerance and has no symptoms of blanching of hands or feet or circulation problems.   Patient Active Problem List   Diagnosis Date Noted  . Dysphagia 05/26/2012  . Esophageal reflux 05/26/2012  . Diaphragmatic hernia 05/26/2012  . Hypokalemia 05/13/2012  . Precordial pain 05/12/2012  . HYPOTHYROIDISM 08/14/2007  . HYPERLIPIDEMIA 08/14/2007  . ALLERGIC RHINITIS 08/14/2007  . ASTHMA 08/14/2007    levothyroxine (SYNTHROID, LEVOTHROID) 100 MCG tablet, Take 1 tablet (100 mcg total) by mouth daily. Assorted allergy meds   Review of Systems  Constitutional: Positive for diaphoresis (since 2009) and unexpected weight change (Over the past year). Negative for fever, activity change and appetite change.  HENT: Positive for postnasal drip (history allergic rhinitis) and voice change (past year, "croupy"). Negative for congestion.   Eyes: Negative for photophobia and visual disturbance.  Respiratory: Positive for chest tightness. Negative for shortness of breath and wheezing.   Cardiovascular: Positive for chest pain and palpitations. Negative for leg swelling.  Gastrointestinal: Positive for abdominal pain. Negative for blood in stool.  Endocrine: Negative for polyuria.  Genitourinary: Negative for dysuria, frequency and difficulty urinating.  Musculoskeletal: Negative for back pain, joint swelling, myalgias, neck pain and neck stiffness.  Skin: Negative for rash.  Neurological: Negative for dizziness, tremors, syncope, speech difficulty, light-headedness and headaches.  Hematological: Negative for adenopathy. Does not bruise/bleed easily.  Psychiatric/Behavioral: Negative  for behavioral problems, sleep disturbance, self-injury, dysphoric mood, decreased concentration and agitation.       Objective:   Physical Exam  Nursing note and vitals reviewed. Constitutional: She is oriented to person, place, and time. She appears  well-developed and well-nourished. No distress.  HENT:  Head: Normocephalic and atraumatic.  Nose: Nose normal.  Mouth/Throat: Oropharynx is clear and moist.  Eyes: Conjunctivae and EOM are normal. Pupils are equal, round, and reactive to light.  Neck: Neck supple.  There is a questionable left lower pole thyroid nodule palpable  Cardiovascular: Normal rate, regular rhythm, normal heart sounds and intact distal pulses.   No murmur heard. Pulmonary/Chest: Effort normal and breath sounds normal. No respiratory distress.  Abdominal: Soft. Bowel sounds are normal. She exhibits no distension.  Mildly tender in the epigastrium and the lower quadrants but no masses or organomegaly  Musculoskeletal: Normal range of motion. She exhibits no edema.  Lymphadenopathy:    She has no cervical adenopathy.  Neurological: She is alert and oriented to person, place, and time. She has normal reflexes. No cranial nerve deficit.  Skin: Skin is warm and dry. No rash noted.  Psychiatric: She has a normal mood and affect. Her behavior is normal. Judgment and thought content normal.   Results for orders placed in visit on 03/08/13  TSH      Result Value Ref Range   TSH 4.728 (*) 0.350 - 4.500 uIU/mL  CBC WITH DIFFERENTIAL      Result Value Ref Range   WBC 6.2  4.0 - 10.5 K/uL   RBC 4.42  3.87 - 5.11 MIL/uL   Hemoglobin 13.8  12.0 - 15.0 g/dL   HCT 39.6  36.0 - 46.0 %   MCV 89.6  78.0 - 100.0 fL   MCH 31.2  26.0 - 34.0 pg   MCHC 34.8  30.0 - 36.0 g/dL   RDW 13.9  11.5 - 15.5 %   Platelets 221  150 - 400 K/uL   Neutrophils Relative % 53  43 - 77 %   Neutro Abs 3.3  1.7 - 7.7 K/uL   Lymphocytes Relative 37  12 - 46 %   Lymphs Abs 2.3  0.7 - 4.0 K/uL   Monocytes Relative 7  3 - 12 %   Monocytes Absolute 0.4  0.1 - 1.0 K/uL   Eosinophils Relative 2  0 - 5 %   Eosinophils Absolute 0.1  0.0 - 0.7 K/uL   Basophils Relative 1  0 - 1 %   Basophils Absolute 0.1  0.0 - 0.1 K/uL   Smear Review Criteria for  review not met    T4, FREE      Result Value Ref Range   Free T4 1.26  0.80 - 1.80 ng/dL  HEPATITIS C ANTIBODY      Result Value Ref Range   HCV Ab NEGATIVE  NEGATIVE  COMPLETE METABOLIC PANEL WITH GFR      Result Value Ref Range   Sodium 142  135 - 145 mEq/L   Potassium 3.9  3.5 - 5.3 mEq/L   Chloride 103  96 - 112 mEq/L   CO2 31  19 - 32 mEq/L   Glucose, Bld 110 (*) 70 - 99 mg/dL   BUN 14  6 - 23 mg/dL   Creat 0.91  0.50 - 1.10 mg/dL   Total Bilirubin 0.2  0.2 - 1.2 mg/dL   Alkaline Phosphatase 53  39 - 117 U/L   AST 15  0 -  37 U/L   ALT 10  0 - 35 U/L   Total Protein 7.3  6.0 - 8.3 g/dL   Albumin 4.5  3.5 - 5.2 g/dL   Calcium 9.4  8.4 - 10.5 mg/dL   GFR, Est African American 77     GFR, Est Non African American 66    SEDIMENTATION RATE      Result Value Ref Range   Sed Rate 12  0 - 22 mm/hr     Filed Vitals:   03/08/13 1430  BP: 128/60  Pulse: 100  Temp: 97.9 F (36.6 C)  TempSrc: Oral  Resp: 18  Height: 5' 2.5" (1.588 m)  Weight: 131 lb (59.421 kg)  SpO2: 98%    Wt Readings from Last 3 Encounters:  03/08/13 131 lb (59.421 kg)  12/28/12 134 lb 3.2 oz (60.873 kg)  11/09/12 133 lb 12.8 oz (60.691 kg)        Assessment & Plan:   I have completed the patient encounter in its entirety as documented by the scribe, with editing by me where necessary. Piano Dunne P. Laney Pastor, M.D.  Loss of weight -due to change in appetite secondary to postprandial abdominal discomfort GI workup Dr Teryl Lucy October 2014 revealed a 5 cm hiatal hernia but no other findings/he suggested pH studies/upper GI March 2014 documented spontaneous reflux frequently  Sweating -excessive/day and night--not associated with flushing or hypertension or recurrent headaches No evidence of tuberculosis or underlying infection/no evidence of lymphoma or solid tumors such as medullary carcinoma of the thyroid, renal cell carcinoma, or insulinoma/no evidence of endocarditis In view of her paroxysmal  symptoms it would make sense to rule out carcinoid and pheochromocytoma(24-hour urine collection for 5-HIAA, metanephrine, and catecholamines) to be able to reassure her-if all is negative we can try  Propranolol, which might help with palpitations, or even glycopyrrolate.  Hypothyroid - control appears adequate from lab values/her symptoms should not be related to this. However, with a questionable left lower thyroid nodule ultrasound will be done.  Palpitations--unclear etiology/multiple EKGs within normal limits /CT angiography of the chest September 2014 within normal limits. Chest x-ray normal except mild hyperinflation at the same time.  Hiatal hernia with reflux-because sweats can be associated with nocturnal reflux and palpitations can be more recognized in patients with esophageal reflux, we should try for complete control of her symptoms in the epigastric area after ph studies confirm a problem(? Why esoph looks normal when UGI documents lots of reflux episodes)

## 2013-03-09 DIAGNOSIS — Z1231 Encounter for screening mammogram for malignant neoplasm of breast: Secondary | ICD-10-CM | POA: Diagnosis not present

## 2013-03-09 DIAGNOSIS — N6009 Solitary cyst of unspecified breast: Secondary | ICD-10-CM | POA: Diagnosis not present

## 2013-03-09 LAB — TSH: TSH: 4.728 u[IU]/mL — ABNORMAL HIGH (ref 0.350–4.500)

## 2013-03-09 LAB — T4, FREE: Free T4: 1.26 ng/dL (ref 0.80–1.80)

## 2013-03-10 ENCOUNTER — Other Ambulatory Visit: Payer: Self-pay | Admitting: Family Medicine

## 2013-03-10 NOTE — Patient Instructions (Signed)
Return to Dr. Benson Norway for pH studies Thyroid ultrasound 24-hour urine studies

## 2013-03-14 ENCOUNTER — Telehealth: Payer: Self-pay | Admitting: Family Medicine

## 2013-03-14 ENCOUNTER — Telehealth: Payer: Self-pay

## 2013-03-14 DIAGNOSIS — E039 Hypothyroidism, unspecified: Secondary | ICD-10-CM

## 2013-03-14 NOTE — Addendum Note (Signed)
Addended by: Constance Goltz on: 03/14/2013 04:33 PM   Modules accepted: Orders

## 2013-03-14 NOTE — Telephone Encounter (Signed)
Called and LMOM. Her TSH is now nearly normal, T4 is normal.  As she had complaint of SE such as palpitations will hold steady at current synthroid dose. Asked her to please come in for a recheck TSH in about 2 months; will leave this order on her chart

## 2013-03-14 NOTE — Telephone Encounter (Signed)
Dr. Laney Pastor, pt had a breast biopsy done last week and they saw a questionable area. The radiologist told her that she could either repeat u/s in 6 months or have a biopsy done. She would like to have the biopsy, but we didn't know if she needed a referral. Can we refer her to [facility]? Thanks

## 2013-03-15 ENCOUNTER — Other Ambulatory Visit: Payer: Self-pay | Admitting: *Deleted

## 2013-03-15 DIAGNOSIS — R928 Other abnormal and inconclusive findings on diagnostic imaging of breast: Secondary | ICD-10-CM

## 2013-03-15 NOTE — Telephone Encounter (Signed)
Referral made to Dr Harlow Asa, Pt notified.

## 2013-03-15 NOTE — Telephone Encounter (Signed)
bx done by gen surgery soRef to surgery- consult---Dr Harlow Asa unless he has retired

## 2013-03-17 ENCOUNTER — Ambulatory Visit
Admission: RE | Admit: 2013-03-17 | Discharge: 2013-03-17 | Disposition: A | Discharge status: Home / Self Care | Payer: Medicare Other | Source: Ambulatory Visit | Attending: Internal Medicine | Admitting: Internal Medicine

## 2013-03-17 DIAGNOSIS — E039 Hypothyroidism, unspecified: Secondary | ICD-10-CM

## 2013-03-17 DIAGNOSIS — R634 Abnormal weight loss: Secondary | ICD-10-CM

## 2013-03-17 DIAGNOSIS — E041 Nontoxic single thyroid nodule: Secondary | ICD-10-CM

## 2013-03-17 DIAGNOSIS — IMO0001 Reserved for inherently not codable concepts without codable children: Secondary | ICD-10-CM

## 2013-03-17 DIAGNOSIS — R002 Palpitations: Secondary | ICD-10-CM

## 2013-03-17 DIAGNOSIS — E042 Nontoxic multinodular goiter: Secondary | ICD-10-CM | POA: Diagnosis not present

## 2013-03-23 ENCOUNTER — Telehealth: Payer: Self-pay

## 2013-03-23 NOTE — Telephone Encounter (Signed)
Waiting for dr Laurence Spates to call with reports  [phone]

## 2013-03-24 ENCOUNTER — Telehealth: Payer: Self-pay

## 2013-03-24 NOTE — Telephone Encounter (Signed)
Patient is calling about her ultrasound results, she said she should have heard something by now [phone]

## 2013-03-27 ENCOUNTER — Other Ambulatory Visit: Payer: Self-pay | Admitting: Radiology

## 2013-03-27 DIAGNOSIS — E041 Nontoxic single thyroid nodule: Secondary | ICD-10-CM

## 2013-03-27 DIAGNOSIS — R61 Generalized hyperhidrosis: Secondary | ICD-10-CM

## 2013-03-27 DIAGNOSIS — R634 Abnormal weight loss: Secondary | ICD-10-CM

## 2013-03-27 DIAGNOSIS — E039 Hypothyroidism, unspecified: Secondary | ICD-10-CM

## 2013-03-27 DIAGNOSIS — R002 Palpitations: Secondary | ICD-10-CM

## 2013-03-30 ENCOUNTER — Ambulatory Visit (INDEPENDENT_AMBULATORY_CARE_PROVIDER_SITE_OTHER): Payer: Medicare Other | Admitting: Family Medicine

## 2013-03-30 VITALS — BP 138/74 | HR 104 | Temp 98.1°F | Resp 16 | Ht 63.5 in | Wt 134.8 lb

## 2013-03-30 DIAGNOSIS — N939 Abnormal uterine and vaginal bleeding, unspecified: Secondary | ICD-10-CM

## 2013-03-30 DIAGNOSIS — N898 Other specified noninflammatory disorders of vagina: Secondary | ICD-10-CM

## 2013-03-30 LAB — POCT URINALYSIS DIPSTICK
Bilirubin, UA: NEGATIVE
Glucose, UA: NEGATIVE
Ketones, UA: NEGATIVE
Leukocytes, UA: NEGATIVE
Nitrite, UA: NEGATIVE
Protein, UA: NEGATIVE
Spec Grav, UA: 1.01
Urobilinogen, UA: 0.2
pH, UA: 7

## 2013-03-30 LAB — POCT UA - MICROSCOPIC ONLY
Casts, Ur, LPF, POC: NEGATIVE
Crystals, Ur, HPF, POC: NEGATIVE
Mucus, UA: NEGATIVE
Yeast, UA: NEGATIVE

## 2013-03-30 NOTE — Progress Notes (Signed)
68 yo woman with bloody vaginal discharge associated with bilateral inguinal soreness just in last hour.  She is post complete hysterectomy 1996 for endometriosis (Dr. Sheilah Mins laparscopically).  She had radiation treatment to ears when she suffered from recurrent otitis.  Currently she is caring for her 52 year old father  She is in process of workup for breast nodules and thyroid nodules.  Also she has problems when she eats with epigastric pain x 3 years.  Recent endoscopy was negative.  Patient notes that she's lost weight with a reduction of 3 sizes over the last year or 2. She feels bad all the time with postprandial epigastric pain. Patient is also had problems with intermittent tachycardia. She's been to the emergency room a couple times and has had a catheterization which was perfectly normal.  Objective:  NAD  Pelvic:  Bright red elliptical macular area just to the left of the urethral orifice.   Moderate inguinal adenopathy Abdomen:  Diffuse mild tenderness with deep palpation in pelvis  Results for orders placed in visit on 03/30/13  POCT UA - MICROSCOPIC ONLY      Result Value Ref Range   WBC, Ur, HPF, POC 2-3     RBC, urine, microscopic 2-3     Bacteria, U Microscopic trace     Mucus, UA neg     Epithelial cells, urine per micros 1-3     Crystals, Ur, HPF, POC neg     Casts, Ur, LPF, POC neg     Yeast, UA neg    POCT URINALYSIS DIPSTICK      Result Value Ref Range   Color, UA yellow     Clarity, UA clear     Glucose, UA neg     Bilirubin, UA neg     Ketones, UA neg     Spec Grav, UA 1.010     Blood, UA small     pH, UA 7.0     Protein, UA neg     Urobilinogen, UA 0.2     Nitrite, UA neg     Leukocytes, UA Negative     I spent 40 minutes face-to-face with patient discussing multiple problems she's been having to deal with.  Assessment:  Unusual vaginal lesion with longterm chronic malaise and weight loss.  Plan:  Urgent gynecological eval  Robyn Haber, MD

## 2013-04-03 ENCOUNTER — Encounter: Payer: Self-pay | Admitting: Internal Medicine

## 2013-04-03 DIAGNOSIS — N952 Postmenopausal atrophic vaginitis: Secondary | ICD-10-CM | POA: Diagnosis not present

## 2013-04-03 DIAGNOSIS — N368 Other specified disorders of urethra: Secondary | ICD-10-CM | POA: Diagnosis not present

## 2013-04-05 ENCOUNTER — Encounter: Payer: Self-pay | Admitting: Internal Medicine

## 2013-04-12 ENCOUNTER — Encounter: Payer: Self-pay | Admitting: Radiology

## 2013-04-12 DIAGNOSIS — N898 Other specified noninflammatory disorders of vagina: Secondary | ICD-10-CM

## 2013-04-13 ENCOUNTER — Ambulatory Visit (INDEPENDENT_AMBULATORY_CARE_PROVIDER_SITE_OTHER): Payer: Medicare Other | Admitting: Surgery

## 2013-04-14 ENCOUNTER — Encounter (INDEPENDENT_AMBULATORY_CARE_PROVIDER_SITE_OTHER): Payer: Self-pay | Admitting: General Surgery

## 2013-04-14 ENCOUNTER — Ambulatory Visit (INDEPENDENT_AMBULATORY_CARE_PROVIDER_SITE_OTHER): Payer: Medicare Other | Admitting: General Surgery

## 2013-04-14 VITALS — BP 126/80 | HR 76 | Temp 98.5°F | Resp 16 | Ht 62.0 in | Wt 131.0 lb

## 2013-04-14 DIAGNOSIS — N63 Unspecified lump in unspecified breast: Secondary | ICD-10-CM | POA: Diagnosis not present

## 2013-04-14 NOTE — Progress Notes (Signed)
Patient ID: Mary Rubio, female   DOB: Sep 30, 1945, 68 y.o.   MRN: [id number]  Chief Complaint  Patient presents with  . eval breast    new pt    HPI Mary Rubio is a 68 y.o. female.  Referred by Dr [name] HPI This is a 68 year old female with a number of systemic complaints. Many of these sound like they may be due to her thyroid. She does have a history of left breast pain that has been present for some time but does not notice anything in particular on her exam. She notes most of the pain in the subareolar region. She also has some weight loss and she's not really hungry recently. She has a voice change that she's also having evaluated as well. She recently underwent a screening mammogram that showed her breast density to be B. There is an 8 mm oval mass with a circumscribe margin in the right breast central to the nipple 2.7 cm away. There is also a 0.5 cm oval mass in the left breast 3.2 cm in the nipple. She underwent an ultrasound which shows the lesion in the right breast is a simple cyst in the other likely represents a fibroadenoma and was discussed a biopsy versus a followup mammogram and ultrasound. She now comes in today to discuss options.  Past Medical History  Diagnosis Date  . Environmental allergies     Weekly allergy shots  . Depression   . GERD (gastroesophageal reflux disease)   . Hypothyroidism   . Anxiety   . Hyperlipidemia   . Hiatal hernia   . Asthma     Past Surgical History  Procedure Laterality Date  . Abdominal hysterectomy    . Cholecystectomy    . Bunionectomy    . Thumb surgery      for cyst  . Exploratory laparotomy      endometriosis  . Bartholin gland cyst excision    . Facial cyst      excision  . Esophagogastroduodenoscopy N/A 10/21/2012    Procedure: ESOPHAGOGASTRODUODENOSCOPY (EGD);  Surgeon: Beryle Beams, MD;  Location: [facility];  Service: Endoscopy;  Laterality: N/A;    Family History  Problem Relation  Age of Onset  . Cancer Mother   . Hypertension Mother   . Cancer Father   . Dementia Father   . Heart disease Sister   . Thyroid disease Daughter   . Heart disease Maternal Grandfather   . Heart disease Paternal Grandmother   . Thyroid disease Paternal Grandfather   . Arrhythmia Father   . CAD Sister     MI/stent age 22, CABG age 69  . CAD      Several relatives on dad's side have had MIs/CABG    Social History History  Substance Use Topics  . Smoking status: Never Smoker   . Smokeless tobacco: Not on file  . Alcohol Use: No    Allergies  Allergen Reactions  . Amoxicillin-Pot Clavulanate Nausea And Vomiting    Pt reports this is an error.  . Crestor [Rosuvastatin]     Myalgias  . Latex Other (See Comments)    Canker sores in mouth (discovered by dentist)  . Meperidine Hcl Other (See Comments)    Does not remember   . Morphine And Related Itching    Has tolerated small doses of hydrocodone  . Pentazocine Lactate Nausea And Vomiting    Sick on stomach  . Simvastatin Other (See Comments)    Muscle aches   .  Codeine Rash    Has tolerated small doses of hydrocodone  . Morphine Rash    Current Outpatient Prescriptions  Medication Sig Dispense Refill  . albuterol (PROVENTIL HFA;VENTOLIN HFA) 108 (90 BASE) MCG/ACT inhaler Inhale 2 puffs into the lungs every 6 (six) hours as needed for wheezing or shortness of breath.      . ALPRAZolam (XANAX) 0.25 MG tablet Take 1 tablet (0.25 mg total) by mouth 2 (two) times daily as needed.  60 tablet  1  . amoxicillin (AMOXIL) 875 MG tablet Take 1 tablet (875 mg total) by mouth 2 (two) times daily.  20 tablet  0  . Bepotastine Besilate (BEPREVE OP) Apply to eye daily.      . Cholecalciferol (VITAMIN D PO) Take 1 tablet by mouth daily.      . fluticasone (VERAMYST) 27.5 MCG/SPRAY nasal spray Place 2 sprays into the nose daily.      Marland Kitchen ipratropium (ATROVENT) 0.03 % nasal spray Place 2 sprays into the nose 4 (four) times daily.  30 mL  6   . levothyroxine (SYNTHROID, LEVOTHROID) 100 MCG tablet Take 1 tablet (100 mcg total) by mouth daily.  30 tablet  3  . Olopatadine HCl (PATADAY) 0.2 % SOLN Place 1 drop into both eyes daily.      . ranitidine (ZANTAC) 150 MG tablet Take 1 tablet (150 mg total) by mouth 2 (two) times daily.  180 tablet  3   No current facility-administered medications for this visit.    Review of Systems Review of Systems  Constitutional: Negative for fever, chills and unexpected weight change.  HENT: Positive for voice change. Negative for congestion, hearing loss, sore throat and trouble swallowing.   Eyes: Negative for visual disturbance.  Respiratory: Positive for cough. Negative for wheezing.   Cardiovascular: Positive for palpitations. Negative for chest pain and leg swelling.  Gastrointestinal: Negative for nausea, vomiting, abdominal pain, diarrhea, constipation, blood in stool, abdominal distention and anal bleeding.  Genitourinary: Negative for hematuria, vaginal bleeding and difficulty urinating.  Musculoskeletal: Negative for arthralgias.  Skin: Negative for rash and wound.  Neurological: Negative for seizures, syncope and headaches.  Hematological: Negative for adenopathy. Does not bruise/bleed easily.  Psychiatric/Behavioral: Negative for confusion.    Blood pressure 126/80, pulse 76, temperature 98.5 F (36.9 C), temperature source Oral, resp. rate 16, height 5\' 2"  (1.575 m), weight 131 lb (59.421 kg).  Physical Exam Physical Exam  Vitals reviewed. Constitutional: She appears well-developed and well-nourished.  Pulmonary/Chest: Right breast exhibits no inverted nipple, no mass, no nipple discharge, no skin change and no tenderness. Left breast exhibits tenderness (throughout left breast). Left breast exhibits no inverted nipple, no mass, no nipple discharge and no skin change.  Lymphadenopathy:    She has no cervical adenopathy.    She has no axillary adenopathy.       Right: No  supraclavicular adenopathy present.       Left: No supraclavicular adenopathy present.    Data Reviewed Mm from [facility]  Assessment    Left breast pain Likely left breast FA     Plan    We discussed the left breast pain and discussed a number of conservative treatments to help with that. I don't think this is related to the findings on her ultrasound and mammogram. She has very dense fibrocystic tissue making her exam difficult bilateral feel anything concerning. I think would be reasonable to followup in 6 months that she discuss with radiology although I told her that to  make sure she should have a biopsy. I would be comfortable following this and this is what she would like to do. I will plan on seeing her back in 6 months after her the next mammogram and ultrasound.        Mary Rubio 04/14/2013, 1:36 PM

## 2013-04-23 ENCOUNTER — Ambulatory Visit (INDEPENDENT_AMBULATORY_CARE_PROVIDER_SITE_OTHER): Payer: Medicare Other | Admitting: Internal Medicine

## 2013-04-23 VITALS — BP 110/60 | HR 93 | Temp 98.0°F | Resp 18 | Ht 62.5 in | Wt 133.0 lb

## 2013-04-23 DIAGNOSIS — R309 Painful micturition, unspecified: Secondary | ICD-10-CM

## 2013-04-23 DIAGNOSIS — R300 Dysuria: Secondary | ICD-10-CM | POA: Diagnosis not present

## 2013-04-23 DIAGNOSIS — R350 Frequency of micturition: Secondary | ICD-10-CM | POA: Diagnosis not present

## 2013-04-23 LAB — POCT UA - MICROSCOPIC ONLY
Bacteria, U Microscopic: NEGATIVE
Casts, Ur, LPF, POC: NEGATIVE
Crystals, Ur, HPF, POC: NEGATIVE
Mucus, UA: NEGATIVE
Yeast, UA: NEGATIVE

## 2013-04-23 LAB — POCT URINALYSIS DIPSTICK
Bilirubin, UA: NEGATIVE
Glucose, UA: NEGATIVE
Ketones, UA: NEGATIVE
Nitrite, UA: POSITIVE
Spec Grav, UA: 1.02
Urobilinogen, UA: 1
pH, UA: 5.5

## 2013-04-23 MED ORDER — CIPROFLOXACIN HCL 250 MG PO TABS: 250.0000 mg | ORAL_TABLET | Freq: Two times a day (BID) | ORAL | Status: DC

## 2013-04-23 NOTE — Progress Notes (Signed)
Subjective:    Patient ID: Mary Rubio, female    DOB: 09-23-1945, 68 y.o.   MRN: [id number]  Urinary Frequency  Associated symptoms include frequency.  Dysuria  Associated symptoms include frequency.   Chief Complaint  Patient presents with  . Urinary Frequency    x3-4 days   . burning with urination  . Dysuria    This chart was scribed for [name], MD by Thea Alken, ED Scribe. This patient was seen in room 1 and the patient's care was started at 1:58 PM.  HPI Comments: Mary Rubio is a 68 y.o. female who presents to the [facility] complaining of bladder incontinence onset 3-4 days. Pt reports associated dysuria, and urinary frequency. Pt reports that her diet is unstable.   Past Medical History  Diagnosis Date  . Environmental allergies     Weekly allergy shots  . Depression   . GERD (gastroesophageal reflux disease)   . Hypothyroidism   . Anxiety   . Hyperlipidemia   . Hiatal hernia   . Asthma    Allergies  Allergen Reactions  . Amoxicillin-Pot Clavulanate Nausea And Vomiting    Pt reports this is an error.  . Crestor [Rosuvastatin]     Myalgias  . Latex Other (See Comments)    Canker sores in mouth (discovered by dentist)  . Meperidine Hcl Other (See Comments)    Does not remember   . Morphine And Related Itching    Has tolerated small doses of hydrocodone  . Pentazocine Lactate Nausea And Vomiting    Sick on stomach  . Simvastatin Other (See Comments)    Muscle aches   . Codeine Rash    Has tolerated small doses of hydrocodone  . Morphine Rash   Prior to Admission medications   Medication Sig Start Date End Date Taking? Authorizing Provider  albuterol (PROVENTIL HFA;VENTOLIN HFA) 108 (90 BASE) MCG/ACT inhaler Inhale 2 puffs into the lungs every 6 (six) hours as needed for wheezing or shortness of breath.   Yes Historical Provider, MD  ALPRAZolam (XANAX) 0.25 MG tablet Take 1 tablet (0.25 mg total) by mouth  2 (two) times daily as needed. 11/09/12  Yes [name], MD  amoxicillin (AMOXIL) 875 MG tablet Take 1 tablet (875 mg total) by mouth 2 (two) times daily. 12/28/12  Yes Darreld Mclean, MD  Bepotastine Besilate (BEPREVE OP) Apply to eye daily.   Yes Historical Provider, MD  Cholecalciferol (VITAMIN D PO) Take 1 tablet by mouth daily.   Yes Historical Provider, MD  fluticasone (VERAMYST) 27.5 MCG/SPRAY nasal spray Place 2 sprays into the nose daily.   Yes Historical Provider, MD  ipratropium (ATROVENT) 0.03 % nasal spray Place 2 sprays into the nose 4 (four) times daily. 12/28/12  Yes Gay Filler Copland, MD  levothyroxine (SYNTHROID, LEVOTHROID) 100 MCG tablet Take 1 tablet (100 mcg total) by mouth daily. 12/29/12  Yes Jessica C Copland, MD  Olopatadine HCl (PATADAY) 0.2 % SOLN Place 1 drop into both eyes daily.   Yes Historical Provider, MD  ranitidine (ZANTAC) 150 MG tablet Take 1 tablet (150 mg total) by mouth 2 (two) times daily. 09/24/12  Yes Orma Flaming, MD   Review of Systems  Genitourinary: Positive for dysuria and frequency.      Objective:   Physical Exam  Nursing note and vitals reviewed. Constitutional: She is oriented to person, place, and time. She appears well-developed and well-nourished. No distress.  HENT:  Head: Normocephalic  and atraumatic.  Eyes: EOM are normal.  Neck: Neck supple.  Cardiovascular: Normal rate.   Pulmonary/Chest: Effort normal. No respiratory distress.  Musculoskeletal: Normal range of motion.  Neurological: She is alert and oriented to person, place, and time.  Skin: Skin is warm and dry.  Psychiatric: She has a normal mood and affect. Her behavior is normal.   Results for orders placed in visit on 04/23/13  POCT URINALYSIS DIPSTICK      Result Value Ref Range   Color, UA orange     Clarity, UA clear     Glucose, UA neg     Bilirubin, UA neg     Ketones, UA neg     Spec Grav, UA 1.020     Blood, UA small     pH, UA 5.5      Protein, UA trace     Urobilinogen, UA 1.0     Nitrite, UA pos     Leukocytes, UA Trace    POCT UA - MICROSCOPIC ONLY      Result Value Ref Range   WBC, Ur, HPF, POC 8-10     RBC, urine, microscopic 0-1     Bacteria, U Microscopic neg     Mucus, UA neg     Epithelial cells, urine per micros 0-2     Crystals, Ur, HPF, POC neg     Casts, Ur, LPF, POC neg     Yeast, UA neg         Assessment & Plan:  I have completed the patient encounter in its entirety as documented by the scribe, with editing by me where necessary. Kayce Betty P. Laney Pastor, M.D.   1. Pain with urination   2. Burning with urination   3. Frequent urination   suggests UTI  Meds ordered this encounter  Medications  . ciprofloxacin (CIPRO) 250 MG tablet    Sig: Take 1 tablet (250 mg total) by mouth 2 (two) times daily.    Dispense:  10 tablet    Refill:  0   cult

## 2013-04-24 LAB — URINE CULTURE: Colony Count: 3000

## 2013-04-27 ENCOUNTER — Encounter: Payer: Self-pay | Admitting: Internal Medicine

## 2013-05-03 ENCOUNTER — Ambulatory Visit (INDEPENDENT_AMBULATORY_CARE_PROVIDER_SITE_OTHER): Payer: Medicare Other | Admitting: General Surgery

## 2013-05-19 ENCOUNTER — Encounter: Payer: Self-pay | Admitting: Internal Medicine

## 2013-05-19 DIAGNOSIS — Z13820 Encounter for screening for osteoporosis: Secondary | ICD-10-CM

## 2013-05-19 NOTE — Telephone Encounter (Signed)
It looks like Dr. Laney Pastor had a future order for this already in, so I have released it

## 2013-05-22 ENCOUNTER — Other Ambulatory Visit: Payer: Self-pay | Admitting: Endocrinology

## 2013-05-22 DIAGNOSIS — R002 Palpitations: Secondary | ICD-10-CM | POA: Diagnosis not present

## 2013-05-22 DIAGNOSIS — E049 Nontoxic goiter, unspecified: Secondary | ICD-10-CM

## 2013-05-22 DIAGNOSIS — E039 Hypothyroidism, unspecified: Secondary | ICD-10-CM | POA: Diagnosis not present

## 2013-06-01 ENCOUNTER — Encounter: Payer: Self-pay | Admitting: Internal Medicine

## 2013-06-01 DIAGNOSIS — E2839 Other primary ovarian failure: Secondary | ICD-10-CM

## 2013-06-06 DIAGNOSIS — M949 Disorder of cartilage, unspecified: Secondary | ICD-10-CM | POA: Diagnosis not present

## 2013-06-06 DIAGNOSIS — M899 Disorder of bone, unspecified: Secondary | ICD-10-CM | POA: Diagnosis not present

## 2013-06-22 ENCOUNTER — Encounter: Payer: Self-pay | Admitting: Internal Medicine

## 2013-06-28 ENCOUNTER — Telehealth: Payer: Self-pay

## 2013-06-28 DIAGNOSIS — H40019 Open angle with borderline findings, low risk, unspecified eye: Secondary | ICD-10-CM | POA: Diagnosis not present

## 2013-06-28 MED ORDER — CALCIUM CARBONATE-VITAMIN D 500-200 MG-UNIT PO TABS: 1.0000 | ORAL_TABLET | Freq: Every day | ORAL | Status: DC

## 2013-06-28 NOTE — Telephone Encounter (Signed)
PT STATES SHE HAD A BONE DESTINY TEST OVER 2 WEEKS AGO AND HAVE BEEN TRYING TO GO INTO "MYCHART" AND HAVEN'T BEEN ABLE TO PULL UP PLEASE CALL [phone]

## 2013-06-28 NOTE — Telephone Encounter (Signed)
Sent to pharmacy 

## 2013-06-28 NOTE — Telephone Encounter (Signed)
Unable to locate test results in Dr. Laney Pastor box.  Called [facility] to get results faxed over to [place].   Received results- Reviewed with Benjamine Mola- Significant decrease in bone mineral density. Do not need to start osteoporosis medication. Continue vitamin D and start calcium. Increase weight bearing exercises as tolerated.  Repeat exam in 2 years.  Sent results to be scanned into the chart.  Pt request a prescription sent to her pharmacy for [place]. I have pended medication.

## 2013-07-04 DIAGNOSIS — E039 Hypothyroidism, unspecified: Secondary | ICD-10-CM | POA: Diagnosis not present

## 2013-07-05 ENCOUNTER — Encounter: Payer: Self-pay | Admitting: Internal Medicine

## 2013-07-07 ENCOUNTER — Encounter: Payer: Self-pay | Admitting: Internal Medicine

## 2013-07-31 ENCOUNTER — Encounter: Payer: Self-pay | Admitting: Internal Medicine

## 2013-08-16 ENCOUNTER — Encounter: Payer: Self-pay | Admitting: Internal Medicine

## 2013-08-16 ENCOUNTER — Ambulatory Visit (INDEPENDENT_AMBULATORY_CARE_PROVIDER_SITE_OTHER): Payer: Medicare Other | Admitting: Internal Medicine

## 2013-08-16 VITALS — BP 102/60 | HR 71 | Temp 98.1°F | Resp 16 | Ht 62.5 in | Wt 135.2 lb

## 2013-08-16 DIAGNOSIS — L659 Nonscarring hair loss, unspecified: Secondary | ICD-10-CM

## 2013-08-16 DIAGNOSIS — Z23 Encounter for immunization: Secondary | ICD-10-CM

## 2013-08-16 DIAGNOSIS — R5383 Other fatigue: Secondary | ICD-10-CM

## 2013-08-16 DIAGNOSIS — M7062 Trochanteric bursitis, left hip: Secondary | ICD-10-CM

## 2013-08-16 DIAGNOSIS — E039 Hypothyroidism, unspecified: Secondary | ICD-10-CM

## 2013-08-16 DIAGNOSIS — M76899 Other specified enthesopathies of unspecified lower limb, excluding foot: Secondary | ICD-10-CM

## 2013-08-16 DIAGNOSIS — R5381 Other malaise: Secondary | ICD-10-CM | POA: Diagnosis not present

## 2013-08-16 DIAGNOSIS — E876 Hypokalemia: Secondary | ICD-10-CM

## 2013-08-16 DIAGNOSIS — G4700 Insomnia, unspecified: Secondary | ICD-10-CM

## 2013-08-16 MED ORDER — ZOSTER VACCINE LIVE 19400 UNT/0.65ML ~~LOC~~ SOLR: 0.6500 mL | Freq: Once | SUBCUTANEOUS | Status: DC

## 2013-08-16 NOTE — Progress Notes (Signed)
Subjective:  This chart was scribed for Dr. Linton Ham. Laney Pastor, MD by Stacy Gardner, [facility] Scribe. The patient was seen in room and the patient's care was started at 12:24 PM.  Chief Complaint  Patient presents with  . Thyroid issues  . bone loss from Bone Density     Patient ID: Mary Rubio, female    DOB: 1946-01-10, 68 y.o.   MRN: [id number]  08/16/2013  Thyroid issues and bone loss from Bone Density   HPI HPI Comments: Mary Rubio is a 68 y.o. female w/ hx of depression, GERD, hypothyroidism, and anxiety arrives to the [facility] complaining of thyroid issues. Pt reports having bone loss after a Bone Density test.  She requests medication refill and a shingles vaccination.  Pt states being told by Dr. Suzette Battiest that she will not see her if she does not take her medication as instructed daily.  She had a TSH level of 6.8 at her last visit with Dr. Suzette Battiest. Pt was also told that she has nodules on her thyroid. Her dosage was changed at her last visit to see her. She is not happy with this current relationship as she feels she is not being listened to.  Pt reports having hair loss, trouble falling asleep, trouble staying asleep, and severe fatigue with taking this dosage. Pt states that she just want to rest and does not have any stamina for ADL.  Pt mentions having a rapid heart beat and arhythmia with lying on her right side or with walking. She has had a normal cardiac evaluation. Pt states, " I can feel it in my nose".  Pt's next appointment is in November with her endocrinologist and she feels this is too far away.   She has started exercising on a stationary bike at a gym 1 week ago and has constant, moderate, left, lateral hip pain for the past week. Pt has a shooting pain with pressure on her left hip.   She was diagnosed with obstructive sleep apnea several years ago by She had a repeat sleep test which was normal  Pt  has lab evaluation for TSH tomorrow Pt's sister and mother have heart conditions.  She has a mammogram and a breast exam in August.   1 significant problem at this point is that she is overwhelmed with being responsible for the care of her father who has progressive dementia and despite being in good physical health. Their relationship seems somewhat inappropriate in terms of the comments that he makes to her. He is used to being taken care off completely in terms of needs and gets angry when this does not take place. Feelings of anxiety and depression about the situation dominate her thinking. On a positive note she has a new set of women friends who are very active and a large scale activity focusing women's spiritual needs. Patient Active Problem List   Diagnosis Date Noted  . Vaginal lesion 04/12/2013  . Dysphagia 05/26/2012  . Esophageal reflux 05/26/2012  . Diaphragmatic hernia 05/26/2012  . Hypokalemia 05/13/2012  . Precordial pain 05/12/2012  . HYPOTHYROIDISM 08/14/2007  . HYPERLIPIDEMIA 08/14/2007  . ALLERGIC RHINITIS 08/14/2007  . ASTHMA 08/14/2007    Past Surgical History  Procedure Laterality Date  . Abdominal hysterectomy    . Cholecystectomy    . Bunionectomy    . Thumb surgery      for cyst  . Exploratory laparotomy      endometriosis  .  Bartholin gland cyst excision    . Facial cyst      excision  . Esophagogastroduodenoscopy N/A 10/21/2012    Procedure: ESOPHAGOGASTRODUODENOSCOPY (EGD);  Surgeon: Beryle Beams, MD;  Location: [facility];  Service: Endoscopy;  Laterality: N/A;   Allergies  Allergen Reactions  . Amoxicillin-Pot Clavulanate Nausea And Vomiting    Pt reports this is an error.  . Crestor [Rosuvastatin]     Myalgias  . Latex Other (See Comments)    Canker sores in mouth (discovered by dentist)  . Meperidine Hcl Other (See Comments)    Does not remember   . Morphine And Related Itching    Has tolerated small doses of hydrocodone  .  Pentazocine Lactate Nausea And Vomiting    Sick on stomach  . Simvastatin Other (See Comments)    Muscle aches   . Codeine Rash    Has tolerated small doses of hydrocodone  . Morphine Rash   Prior to Admission medications   Medication Sig Start Date End Date Taking? Authorizing Provider  albuterol (PROVENTIL HFA;VENTOLIN HFA) 108 (90 BASE) MCG/ACT inhaler Inhale 2 puffs into the lungs every 6 (six) hours as needed for wheezing or shortness of breath.    Historical Provider, MD  ALPRAZolam Duanne Moron) 0.25 MG tablet Take 1 tablet (0.25 mg total) by mouth 2 (two) times daily as needed. 11/09/12   [name], MD  Bepotastine Besilate ([place] OP) Apply to eye daily.    Historical Provider, MD  calcium-vitamin D (OSCAL WITH D) 500-200 MG-UNIT per tablet Take 1 tablet by mouth daily. 06/28/13   Eleanore Kurtis Bushman, PA-C  Cholecalciferol (VITAMIN D PO) Take 1 tablet by mouth daily.    Historical Provider, MD  ciprofloxacin (CIPRO) 250 MG tablet Take 1 tablet (250 mg total) by mouth 2 (two) times daily. 04/23/13   [name], MD  fluticasone (VERAMYST) 27.5 MCG/SPRAY nasal spray Place 2 sprays into the nose daily.    Historical Provider, MD  ipratropium (ATROVENT) 0.03 % nasal spray Place 2 sprays into the nose 4 (four) times daily. 12/28/12   Gay Filler Copland, MD  levothyroxine (SYNTHROID, LEVOTHROID) 100 MCG tablet Take 1 tablet (100 mcg total) by mouth daily. 12/29/12   Gay Filler Copland, MD  Olopatadine HCl (PATADAY) 0.2 % SOLN Place 1 drop into both eyes daily.    Historical Provider, MD  ranitidine (ZANTAC) 150 MG tablet Take 1 tablet (150 mg total) by mouth 2 (two) times daily. 09/24/12   Orma Flaming, MD     Review of Systems  Constitutional: Positive for fatigue.  HENT:       Hair loss  Respiratory: Negative for shortness of breath.   Cardiovascular: Positive for palpitations.  Musculoskeletal: Positive for arthralgias and myalgias.  Psychiatric/Behavioral: Positive for sleep  disturbance.  All other systems reviewed and are negative.      Objective:     Filed Vitals:   08/16/13 1222  BP: 102/60  Pulse: 71  Temp: 98.1 F (36.7 C)  TempSrc: Oral  Resp: 16  Height: 5' 2.5" (1.588 m)  Weight: 135 lb 3.2 oz (61.326 kg)  SpO2: 98%     Physical Exam  Constitutional: She is oriented to person, place, and time. She appears well-developed and well-nourished.  HENT:  Head: Normocephalic.  Eyes: Conjunctivae and EOM are normal. Pupils are equal, round, and reactive to light.  Neck: No thyromegaly present.  Cardiovascular: Normal rate, regular rhythm and normal heart sounds.   No murmur  heard. Pulmonary/Chest: Effort normal.  Musculoskeletal: She exhibits no edema.  She has a good range and of the motion of the left hip except for mild pain on internal rotation, but has a very significant tenderness to palpation over the greater trochanter and into the upper iliotibial band  Lymphadenopathy:    She has no cervical adenopathy.  Neurological: She is alert and oriented to person, place, and time. She has normal reflexes.  Psychiatric:  She is obviously overwhelmed and distraught with her current situation       Assessment & Plan:  Hypothyroidism, unspecified hypothyroidism type  Need for shingles vaccine - Plan: zoster vaccine live, PF, (ZOSTAVAX) 40981 UNT/0.65ML injection  Other malaise and fatigue  Hair loss  Insomnia  Trochanteric bursitis of left hip  Deconditioned from a physical standpoint  Obviously her level of stress is the most important issue with her medical presentation//she would like a second endocrine consult and will refer her to [place] We'll send to PT at [facility] orth[facility]([place]) where her father currently is in therapy and start Mobic for her hip I also last him to consider a program for her current level of deconditioning which I think is contributing to her overall fatigue and myalgia symptoms We had a long  discussion about transitioning her father to a nursing care facility    I personally performed the services described in this documentation, which was scribed in my presence. The recorded information has been reviewed and is accurate.

## 2013-08-17 ENCOUNTER — Telehealth: Payer: Self-pay

## 2013-08-17 DIAGNOSIS — E049 Nontoxic goiter, unspecified: Secondary | ICD-10-CM | POA: Diagnosis not present

## 2013-08-17 DIAGNOSIS — E039 Hypothyroidism, unspecified: Secondary | ICD-10-CM | POA: Diagnosis not present

## 2013-08-17 NOTE — Telephone Encounter (Signed)
Pt saw dr. Laney Pastor yesterday and was told by him to take something stronger than over the counter meds for the bursitis of her knee. She never received any pain meds. She wondered if he just forgot or if he changed his mind. She just wants [place] to clarify if she should be taking ibuprofen or something stronger. Please advise!

## 2013-08-18 MED ORDER — MELOXICAM 15 MG PO TABS: 15.0000 mg | ORAL_TABLET | Freq: Every day | ORAL | Status: DC

## 2013-08-18 NOTE — Telephone Encounter (Signed)
Meds ordered this encounter  Medications  . meloxicam (MOBIC) 15 MG tablet    Sig: Take 1 tablet (15 mg total) by mouth daily.    Dispense:  30 tablet    Refill:  0

## 2013-08-21 NOTE — Telephone Encounter (Signed)
Thanks I called her to advise.  

## 2013-08-29 ENCOUNTER — Encounter: Payer: Self-pay | Admitting: Internal Medicine

## 2013-08-31 ENCOUNTER — Ambulatory Visit (INDEPENDENT_AMBULATORY_CARE_PROVIDER_SITE_OTHER): Payer: Medicare Other | Admitting: Internal Medicine

## 2013-08-31 ENCOUNTER — Encounter: Payer: Self-pay | Admitting: Internal Medicine

## 2013-08-31 VITALS — BP 122/72 | HR 85 | Temp 97.9°F | Resp 12 | Ht 62.5 in | Wt 136.0 lb

## 2013-08-31 DIAGNOSIS — E039 Hypothyroidism, unspecified: Secondary | ICD-10-CM

## 2013-08-31 LAB — TSH: TSH: 0.49 u[IU]/mL (ref 0.35–4.50)

## 2013-08-31 LAB — T4, FREE: Free T4: 1 ng/dL (ref 0.60–1.60)

## 2013-08-31 NOTE — Progress Notes (Signed)
Patient ID: Mary Rubio, female   DOB: [birth year], 68 y.o.   MRN: [id number]   HPI  [name] is a 68 y.o.-year-old female, referred by her PCP, Dr. Laney Pastor, for management of hypothyroidism. Previously seen by Dr Chalmers Cater.  Pt. has been dx with hypothyroidism in 1996; is on Synthroid DAW 112 mcg, taken: - fasting - with water - no b'fast  - started calcium with lunch - + Tums before meals - + Ranitine at 3 pm  - no iron, multivitamins   I reviewed pt's thyroid tests - very fluctuating over time: 08/17/2013: TSH 3.45, fT4 0.70 >> she was advised to increase the LT4 to 125 mcg, but she did not do it. She feel better on 112 mcg.  07/04/2013: TSH 6.81 >> dose of LT4 increased from 100 to 112 mcg Lab Results  Component Value Date   TSH 4.728* 03/08/2013   TSH 9.188* 12/28/2012   TSH 0.299* 09/18/2012   TSH 2.096 04/27/2012   TSH 1.222 09/10/2011   FREET4 1.26 03/08/2013   FREET4 1.35 04/27/2012    Pt describes: - + fatigue but improved) - had palpitations, but improved - + weight loss - unintentional - + cold intolerance - + constipation/+ diarrhea - + hair falling - + depression/+ anxiety  Pt denies feeling nodules in neck, hoarseness, dysphagia/odynophagia, SOB with lying down.  She brings the record of a thyroid U/S which we reviewed together (03/17/2013) - a nonenlarged thyroid, with very small hypoechoic regions: Right lobe: 4.2 x 1.5 x 1.2 cm. There are multiple hypoechogenic to anechoic nodules, measuring under 2 mm in diameter Left lobe: 3.9 x 1.3 x 1.5 cm. There are hypoechoic nodules in the mid pole and lower pole of the left thyroid lobe, with the largest nodule measuring 4 mm in diameter. There is a minimal 1 x 3 mm diameter shadowing calcification in the midpole  She has + FH of thyroid disorders in: sister, mother, daughter. No FH of thyroid cancer.  She had a h/o radiation tx to head or neck (for ear infection >> experimental tx - several tx's) No  recent use of iodine supplements.  I reviewed her chart and she also has a history of GERD, low bone density - started Silver sneakers, hiatal hernia.  ROS: Constitutional: + weight loss, + fatigue, + hot flushes, + chills, + poor sleep Eyes: no blurry vision, no xerophthalmia ENT: + sore throat, no nodules palpated in throat, no dysphagia/odynophagia, + hoarseness, + tinnitus Cardiovascular: + CP/+ SOB/+ palpitations/no leg swelling Respiratory: +cough/+ SOB Gastrointestinal: + N/no V/+ D/+ C, + heartburn Musculoskeletal: + muscle aches/+ joint aches Skin: no rashes, + itching, + hair loss Neurological: no tremors/numbness/tingling/dizziness, + HA Psychiatric: + both: depression/anxiety  Past Medical History  Diagnosis Date  . Environmental allergies     Weekly allergy shots  . Depression   . GERD (gastroesophageal reflux disease)   . Hypothyroidism   . Anxiety   . Hyperlipidemia   . Hiatal hernia   . Asthma    Past Surgical History  Procedure Laterality Date  . Abdominal hysterectomy    . Cholecystectomy    . Bunionectomy    . Thumb surgery      for cyst  . Exploratory laparotomy      endometriosis  . Bartholin gland cyst excision    . Facial cyst      excision  . Esophagogastroduodenoscopy N/A 10/21/2012    Procedure: ESOPHAGOGASTRODUODENOSCOPY (EGD);  Surgeon: Beryle Beams, MD;  Location:  [facility];  Service: Endoscopy;  Laterality: N/A;   History   Social History  . Marital Status: Single    Spouse Name: N/A    Number of Children: 1  . Years of Education: college   Occupational History  . caregiver    Social History Main Topics  . Smoking status: Never Smoker   . Smokeless tobacco: No  . Alcohol Use: No  . Drug Use: No   Current Outpatient Prescriptions on File Prior to Visit  Medication Sig Dispense Refill  . albuterol (PROVENTIL HFA;VENTOLIN HFA) 108 (90 BASE) MCG/ACT inhaler Inhale 2 puffs into the lungs every 6 (six) hours as needed for  wheezing or shortness of breath.      . ALPRAZolam (XANAX) 0.25 MG tablet Take 1 tablet (0.25 mg total) by mouth 2 (two) times daily as needed.  60 tablet  1  . Bepotastine Besilate (BEPREVE OP) Apply to eye daily.      . calcium-vitamin D (OSCAL WITH D) 500-200 MG-UNIT per tablet Take 1 tablet by mouth daily.  90 tablet  3  . Cholecalciferol (VITAMIN D PO) Take 1 tablet by mouth daily.      . fluticasone (VERAMYST) 27.5 MCG/SPRAY nasal spray Place 2 sprays into the nose daily.      Marland Kitchen ipratropium (ATROVENT) 0.03 % nasal spray Place 2 sprays into the nose 4 (four) times daily.  30 mL  6  . meloxicam (MOBIC) 15 MG tablet Take 1 tablet (15 mg total) by mouth daily.  30 tablet  0  . Olopatadine HCl (PATADAY) 0.2 % SOLN Place 1 drop into both eyes daily.      . ranitidine (ZANTAC) 150 MG tablet Take 1 tablet (150 mg total) by mouth 2 (two) times daily.  180 tablet  3  . zoster vaccine live, PF, (ZOSTAVAX) 56389 UNT/0.65ML injection Inject 19,400 Units into the skin once.  1 each  0   No current facility-administered medications on file prior to visit.   Allergies  Allergen Reactions  . Amoxicillin-Pot Clavulanate Nausea And Vomiting    Pt reports this is an error.  . Crestor [Rosuvastatin]     Myalgias  . Latex Other (See Comments)    Canker sores in mouth (discovered by dentist)  . Meperidine Hcl Other (See Comments)    Does not remember   . Morphine And Related Itching    Has tolerated small doses of hydrocodone  . Pentazocine Lactate Nausea And Vomiting    Sick on stomach  . Simvastatin Other (See Comments)    Muscle aches   . Codeine Rash    Has tolerated small doses of hydrocodone  . Morphine Rash   Family History  Problem Relation Age of Onset  . Cancer Mother   . Hypertension Mother   . Cancer Father   . Dementia Father   . Heart disease Sister   . Thyroid disease Daughter   . Heart disease Maternal Grandfather   . Heart disease Paternal Grandmother   . Thyroid disease  Paternal Grandfather   . Arrhythmia Father   . CAD Sister     MI/stent age 90, CABG age 49  . CAD      Several relatives on dad's side have had MIs/CABG   PE: BP 122/72  Pulse 85  Temp(Src) 97.9 F (36.6 C) (Oral)  Resp 12  Ht 5' 2.5" (1.588 m)  Wt 136 lb (61.689 kg)  BMI 24.46 kg/m2  SpO2 97% Wt Readings from Last 3  Encounters:  08/31/13 136 lb (61.689 kg)  08/16/13 135 lb 3.2 oz (61.326 kg)  04/23/13 133 lb (60.328 kg)   Constitutional: normal weight, in NAD Eyes: PERRLA, EOMI, no exophthalmos ENT: moist mucous membranes, no thyromegaly, no cervical lymphadenopathy Cardiovascular: RRR, No MRG Respiratory: CTA B Gastrointestinal: abdomen soft, NT, ND, BS+ Musculoskeletal: no deformities, strength intact in all 4 Skin: moist, warm, no rashes Neurological: slight tremor with outstretched hands, DTR normal in all 4  ASSESSMENT: 1. Hypothyroidism  2. Hot flushes  PLAN:  1. Patient with long-standing hypothyroidism, on Synthroid, with fluctuating thyroid hh levels. She appears euthyroid and feels better lately, after TSH finally normalized. She does not appear to have a goiter, thyroid nodules, or neck compression symptoms. - we reviewed her thyroid U/S from 02/2013 >> no goiter, small hypoechoic areas, all <4 mm >> I advised her that no follow up is needed for this >> she will cancel the next U/S (this was already ordered for 09/2013) - We discussed about correct intake of levothyroxine, fasting, with water, separated by at least 30 minutes from breakfast, and separated by more than 4 hours from calcium, iron, multivitamins, acid reflux medications (PPIs). - will check thyroid tests today: TSH, free T4, and TPO ABx to see if the source of variability in her TFTs is 2/2 Hashimoto's thyroiditis - continue Synthroid DAW 112 mcg daily for now - she will need to return in 6-8 weeks for repeat labs - I will see her back in 4 months  2. Hot flushes - I suggested Relizen and given  her info about this  Office Visit on 08/31/2013  Component Date Value Ref Range Status  . Thyroid Peroxidase Antibody 08/31/2013 <10.0  <35.0 IU/mL Final   Comment:                            The thyroid microsomal antigen has been shown to be Thyroid                          Peroxidase (TPO).  This assay detects anti-TPO antibodies.  Marland Kitchen TSH 08/31/2013 0.49  0.35 - 4.50 uIU/mL Final  . Free T4 08/31/2013 1.00  0.60 - 1.60 ng/dL Final   No Hashimoto's thyroiditis. TFTs normal.

## 2013-08-31 NOTE — Patient Instructions (Signed)
Please continue Synthroid 112 mcg daily in am. Please stop at the lab.  Please start Relizen as we discussed.  Please come back for labs in 2 months and for a visit in 4 months.

## 2013-09-01 DIAGNOSIS — M76899 Other specified enthesopathies of unspecified lower limb, excluding foot: Secondary | ICD-10-CM | POA: Diagnosis not present

## 2013-09-01 LAB — THYROID PEROXIDASE ANTIBODY: Thyroperoxidase Ab SerPl-aCnc: <10 IU/mL (ref <35.0)

## 2013-09-06 DIAGNOSIS — N63 Unspecified lump in unspecified breast: Secondary | ICD-10-CM | POA: Diagnosis not present

## 2013-09-07 DIAGNOSIS — M76899 Other specified enthesopathies of unspecified lower limb, excluding foot: Secondary | ICD-10-CM | POA: Diagnosis not present

## 2013-09-14 DIAGNOSIS — M76899 Other specified enthesopathies of unspecified lower limb, excluding foot: Secondary | ICD-10-CM | POA: Diagnosis not present

## 2013-09-19 DIAGNOSIS — M76899 Other specified enthesopathies of unspecified lower limb, excluding foot: Secondary | ICD-10-CM | POA: Diagnosis not present

## 2013-09-21 ENCOUNTER — Encounter: Payer: Self-pay | Admitting: Internal Medicine

## 2013-10-02 DIAGNOSIS — M76899 Other specified enthesopathies of unspecified lower limb, excluding foot: Secondary | ICD-10-CM | POA: Diagnosis not present

## 2013-10-04 DIAGNOSIS — M76899 Other specified enthesopathies of unspecified lower limb, excluding foot: Secondary | ICD-10-CM | POA: Diagnosis not present

## 2013-10-10 DIAGNOSIS — M76899 Other specified enthesopathies of unspecified lower limb, excluding foot: Secondary | ICD-10-CM | POA: Diagnosis not present

## 2013-10-19 ENCOUNTER — Other Ambulatory Visit (INDEPENDENT_AMBULATORY_CARE_PROVIDER_SITE_OTHER): Payer: Medicare Other

## 2013-10-19 DIAGNOSIS — E039 Hypothyroidism, unspecified: Secondary | ICD-10-CM

## 2013-10-19 LAB — TSH: TSH: 0.68 u[IU]/mL (ref 0.35–4.50)

## 2013-10-19 LAB — T4, FREE: Free T4: 1.1 ng/dL (ref 0.60–1.60)

## 2013-10-20 ENCOUNTER — Other Ambulatory Visit: Payer: Medicare Other

## 2013-10-20 ENCOUNTER — Telehealth: Payer: Self-pay | Admitting: *Deleted

## 2013-10-20 NOTE — Telephone Encounter (Signed)
I released them yesterday and she viewed them yesterday... I don't understand.  Entered by Philemon Kingdom, MD at 10/19/2013 5:14 PM  Read by [name] at 10/19/2013 5:17 PM  Dear Ms Olin Hauser,  Your thyroid tests are normal.  Sincerely,  Philemon Kingdom MD

## 2013-10-20 NOTE — Telephone Encounter (Signed)
Pt requesting lab results as soon as possible.

## 2013-10-31 ENCOUNTER — Other Ambulatory Visit: Payer: Medicare Other

## 2013-10-31 ENCOUNTER — Encounter: Payer: Self-pay | Admitting: Family Medicine

## 2013-10-31 ENCOUNTER — Ambulatory Visit (INDEPENDENT_AMBULATORY_CARE_PROVIDER_SITE_OTHER): Payer: Medicare Other | Admitting: Family Medicine

## 2013-10-31 VITALS — BP 115/74 | HR 80 | Temp 97.7°F | Resp 16 | Ht 64.0 in | Wt 136.2 lb

## 2013-10-31 DIAGNOSIS — F419 Anxiety disorder, unspecified: Principal | ICD-10-CM

## 2013-10-31 DIAGNOSIS — F418 Other specified anxiety disorders: Secondary | ICD-10-CM

## 2013-10-31 DIAGNOSIS — F32A Depression, unspecified: Secondary | ICD-10-CM

## 2013-10-31 DIAGNOSIS — Z23 Encounter for immunization: Secondary | ICD-10-CM

## 2013-10-31 DIAGNOSIS — F329 Major depressive disorder, single episode, unspecified: Secondary | ICD-10-CM

## 2013-10-31 MED ORDER — FLUOXETINE HCL 10 MG PO TABS: 10.0000 mg | ORAL_TABLET | Freq: Every day | ORAL | Status: DC

## 2013-10-31 MED ORDER — CLONAZEPAM 0.5 MG PO TABS: 0.2500 mg | ORAL_TABLET | Freq: Two times a day (BID) | ORAL | Status: DC | PRN

## 2013-10-31 NOTE — Progress Notes (Signed)
   Subjective:    Patient ID: Mary Rubio, female    DOB: [birth year], 68 y.o.   MRN: [id number]  HPI Patient presents today with increasing anxiety and depression. 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 She has had problems regulating her thyroid over the last several years and feels that this contributes to her anxiety. Her last TSH was 0.68. She reports feeling better when it is in the 3-4 range. She sees Dr. Letta Median for management of thyroid. The patient is frustrated that it has been so difficult to regulate her thyroid medication.  She is getting regular exercise at the gym, she attends church and bible study and she has a good group of friends with whom she socializes. She denies suicidal or homicidal ideation. She reports that she looks forward to things in her day and is able to enjoy herself. She feels "wound up," and always on edge; getting frustrated with other drivers on the road and having a short fuse. She has difficulty falling and staying asleep and can not turn off her thoughts. She has been on many antidepressants in the past and can not specifically recall which ones worked or didn't work for her. She states that she is very sensitive to medication.    Review of Systems No chest pain, no SOB, no headache, +sweats    Objective:   Physical Exam  Vitals reviewed. Constitutional: She is oriented to person, place, and time. She appears well-developed and well-nourished.  HENT:  Head: Normocephalic and atraumatic.  Eyes: Conjunctivae are normal.  Neck: Normal range of motion. Neck supple.  Cardiovascular: Normal rate.   Pulmonary/Chest: Effort normal.  Musculoskeletal: Normal range of motion.  Neurological: She is alert and oriented to person, place, and time.  Skin: Skin is warm and dry.  Psychiatric: She has a normal mood and affect. Her behavior is normal. Judgment and thought content normal.  Tearful at  times.       Assessment & Plan:  Discussed with Dr. Everlene Farrier who also examined patient. 1. Anxiety and depression -Provided written and verbal information regarding diagnosis and treatment. - FLUoxetine (PROZAC) 10 MG tablet; Take 1 tablet (10 mg total) by mouth daily.  Dispense: 30 tablet; Refill: 3 - clonazePAM (KLONOPIN) 0.5 MG tablet; Take 0.5 tablets (0.25 mg total) by mouth 2 (two) times daily as needed for anxiety.  Dispense: 20 tablet; Refill: 0 - follow up 11/24- sooner if needed.  Elby Beck, FNP-BC  [facility]  11/02/2013 8:49 AM

## 2013-10-31 NOTE — Patient Instructions (Signed)
Keep up exercise and social outings  Generalized Anxiety Disorder Generalized anxiety disorder (GAD) is a mental disorder. It interferes with life functions, including relationships, work, and school. GAD is different from normal anxiety, which everyone experiences at some point in their lives in response to specific life events and activities. Normal anxiety actually helps [place] prepare for and get through these life events and activities. Normal anxiety goes away after the event or activity is over.  GAD causes anxiety that is not necessarily related to specific events or activities. It also causes excess anxiety in proportion to specific events or activities. The anxiety associated with GAD is also difficult to control. GAD can vary from mild to severe. People with severe GAD can have intense waves of anxiety with physical symptoms (panic attacks).  SYMPTOMS The anxiety and worry associated with GAD are difficult to control. This anxiety and worry are related to many life events and activities and also occur more days than not for 6 months or longer. People with GAD also have three or more of the following symptoms (one or more in children):  Restlessness.   Fatigue.  Difficulty concentrating.   Irritability.  Muscle tension.  Difficulty sleeping or unsatisfying sleep. DIAGNOSIS GAD is diagnosed through an assessment by your health care provider. Your health care provider will ask you questions aboutyour mood,physical symptoms, and events in your life. Your health care provider may ask you about your medical history and use of alcohol or drugs, including prescription medicines. Your health care provider may also do a physical exam and blood tests. Certain medical conditions and the use of certain substances can cause symptoms similar to those associated with GAD. Your health care provider may refer you to a mental health specialist for further evaluation. TREATMENT The following therapies  are usually used to treat GAD:   Medication. Antidepressant medication usually is prescribed for long-term daily control. Antianxiety medicines may be added in severe cases, especially when panic attacks occur.   Talk therapy (psychotherapy). Certain types of talk therapy can be helpful in treating GAD by providing support, education, and guidance. A form of talk therapy called cognitive behavioral therapy can teach you healthy ways to think about and react to daily life events and activities.  Stress managementtechniques. These include yoga, meditation, and exercise and can be very helpful when they are practiced regularly. A mental health specialist can help determine which treatment is best for you. Some people see improvement with one therapy. However, other people require a combination of therapies. Document Released: 05/02/2012 Document Revised: 05/22/2013 Document Reviewed: 05/02/2012 [facility] Patient Information 2015 [place], [place]. This information is not intended to replace advice given to you by your health care provider. Make sure you discuss any questions you have with your health care provider.

## 2013-11-20 ENCOUNTER — Encounter: Payer: Self-pay | Admitting: Family Medicine

## 2013-11-22 ENCOUNTER — Other Ambulatory Visit: Payer: Medicare Other

## 2013-12-12 ENCOUNTER — Ambulatory Visit: Payer: Medicare Other | Admitting: Family Medicine

## 2013-12-28 ENCOUNTER — Telehealth: Payer: Self-pay

## 2013-12-28 NOTE — Telephone Encounter (Signed)
LMVM reminding patient to get her flu shot.

## 2013-12-29 DIAGNOSIS — H40013 Open angle with borderline findings, low risk, bilateral: Secondary | ICD-10-CM | POA: Diagnosis not present

## 2014-01-01 ENCOUNTER — Ambulatory Visit: Payer: Medicare Other | Admitting: Internal Medicine

## 2014-01-03 ENCOUNTER — Ambulatory Visit (INDEPENDENT_AMBULATORY_CARE_PROVIDER_SITE_OTHER): Payer: Medicare Other | Admitting: Family Medicine

## 2014-01-03 ENCOUNTER — Ambulatory Visit (INDEPENDENT_AMBULATORY_CARE_PROVIDER_SITE_OTHER): Payer: Medicare Other

## 2014-01-03 VITALS — BP 124/70 | HR 104 | Temp 97.9°F | Resp 18 | Ht 62.5 in | Wt 136.4 lb

## 2014-01-03 DIAGNOSIS — K5732 Diverticulitis of large intestine without perforation or abscess without bleeding: Secondary | ICD-10-CM | POA: Diagnosis not present

## 2014-01-03 DIAGNOSIS — M545 Low back pain, unspecified: Secondary | ICD-10-CM

## 2014-01-03 DIAGNOSIS — R109 Unspecified abdominal pain: Secondary | ICD-10-CM

## 2014-01-03 DIAGNOSIS — K5900 Constipation, unspecified: Secondary | ICD-10-CM

## 2014-01-03 LAB — POCT CBC
Granulocyte percent: 59.5 %G (ref 37–80)
HCT, POC: 42.8 % (ref 37.7–47.9)
Hemoglobin: 14.4 g/dL (ref 12.2–16.2)
Lymph, poc: 2.6 (ref 0.6–3.4)
MCH, POC: 30.7 pg (ref 27–31.2)
MCHC: 33.8 g/dL (ref 31.8–35.4)
MCV: 91 fL (ref 80–97)
MID (cbc): 0.5 (ref 0–0.9)
MPV: 7 fL (ref 0–99.8)
POC Granulocyte: 4.6 (ref 2–6.9)
POC LYMPH PERCENT: 33.8 %L (ref 10–50)
POC MID %: 6.7 %M (ref 0–12)
Platelet Count, POC: 250 10*3/uL (ref 142–424)
RBC: 4.7 M/uL (ref 4.04–5.48)
RDW, POC: 12.7 %
WBC: 7.8 10*3/uL (ref 4.6–10.2)

## 2014-01-03 LAB — POCT UA - MICROSCOPIC ONLY
Bacteria, U Microscopic: NEGATIVE
Casts, Ur, LPF, POC: NEGATIVE
Crystals, Ur, HPF, POC: NEGATIVE
Mucus, UA: NEGATIVE
Yeast, UA: NEGATIVE

## 2014-01-03 LAB — POCT URINALYSIS DIPSTICK
Bilirubin, UA: NEGATIVE
Glucose, UA: NEGATIVE
Ketones, UA: NEGATIVE
Leukocytes, UA: NEGATIVE
Nitrite, UA: NEGATIVE
Protein, UA: NEGATIVE
Spec Grav, UA: 1.015
Urobilinogen, UA: 0.2
pH, UA: 5.5

## 2014-01-03 MED ORDER — CIPROFLOXACIN HCL 500 MG PO TABS: 500.0000 mg | ORAL_TABLET | Freq: Two times a day (BID) | ORAL | Status: DC

## 2014-01-03 MED ORDER — METRONIDAZOLE 500 MG PO TABS: 500.0000 mg | ORAL_TABLET | Freq: Two times a day (BID) | ORAL | Status: DC

## 2014-01-03 NOTE — Patient Instructions (Signed)
Ciprofloxacin 500 mg twice daily  Metronidazole 500 mg 3 times daily  Drink plenty of fluids  Take Tylenol or ibuprofen for pain  If not improving by Saturday please come back for a recheck. Otherwise if it is improving just complete the course of antibiotics.  Take MiraLAX for constipation. If necessary to get to broken up and going you can use a fleets enema.  Return at any time or go to the emergency room if worse

## 2014-01-03 NOTE — Progress Notes (Signed)
Subjective: 68 year old lady with history of a week ago developing chills and generalized illness. She had pain in her back. The pains in her back have continued to persist all week. She is no longer running chills. She did not take her temperature. She's had some nausea but no vomiting. She has been a little bit constipated. Knows of no specific injury. 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 She is not on a lot of medications. List was reviewed.  Objective: Pleasant lady in some obvious discomfort dress at times. Her chest is clear. Heart regular without murmurs. Abdomen was soft without masses. Is tender in the far left lateral abdomen. She has no tenderness down her spine but is tender in the soft tissues of the left low back laterally. It seems to be a little further out than usually I would expect with the kidneys. After palpation and movement she was hurting a good deal more. She did well with anterior flexion and extension but onset side tilt had a lot of pain, especially over on that left side.  Assessment: Left flank pain, etiology undetermined. This sounds like it started with a infection etiology, so kidney infections or a posterior positioned diverticulum could be things to worry about.  Plan: Check a CBC and blood chemistries urinalysis and abdominal series  [facility] reading (PRIMARY) by  Dr. Linna Darner Nonspecific abdomen.  Moderate stool burden.  Chest clear  Results for orders placed or performed in visit on 01/03/14  POCT UA - Microscopic Only  Result Value Ref Range   WBC, Ur, HPF, POC 1-3    RBC, urine, microscopic 1-2    Bacteria, U Microscopic neg    Mucus, UA neg    Epithelial cells, urine per micros 0-3    Crystals, Ur, HPF, POC neg    Casts, Ur, LPF, POC neg    Yeast, UA neg   POCT urinalysis dipstick  Result Value Ref Range   Color, UA yellow    Clarity, UA clear    Glucose, UA neg    Bilirubin, UA neg    Ketones, UA neg    Spec Grav, UA 1.015     Blood, UA small    pH, UA 5.5    Protein, UA neg'    Urobilinogen, UA 0.2    Nitrite, UA neg    Leukocytes, UA Negative   POCT CBC  Result Value Ref Range   WBC 7.8 4.6 - 10.2 K/uL   Lymph, poc 2.6 0.6 - 3.4   POC LYMPH PERCENT 33.8 10 - 50 %L   MID (cbc) 0.5 0 - 0.9   POC MID % 6.7 0 - 12 %M   POC Granulocyte 4.6 2 - 6.9   Granulocyte percent 59.5 37 - 80 %G   RBC 4.70 4.04 - 5.48 M/uL   Hemoglobin 14.4 12.2 - 16.2 g/dL   HCT, POC 42.8 37.7 - 47.9 %   MCV 91.0 80 - 97 fL   MCH, POC 30.7 27 - 31.2 pg   MCHC 33.8 31.8 - 35.4 g/dL   RDW, POC 12.7 %   Platelet Count, POC 250 142 - 424 K/uL   MPV 7.0 0 - 99.8 fL   Will treat for constipation and probable diverticulitis. Ciprofloxacin and metronidazole. If he gets worse she is to return. If she is not doing much better over the next few days she should return. Marland Kitchen

## 2014-01-04 LAB — COMPREHENSIVE METABOLIC PANEL
ALT: 12 U/L (ref 0–35)
AST: 16 U/L (ref 0–37)
Albumin: 4.6 g/dL (ref 3.5–5.2)
Alkaline Phosphatase: 71 U/L (ref 39–117)
BUN: 14 mg/dL (ref 6–23)
CO2: 25 mEq/L (ref 19–32)
Calcium: 9.7 mg/dL (ref 8.4–10.5)
Chloride: 102 mEq/L (ref 96–112)
Creat: 0.92 mg/dL (ref 0.50–1.10)
Glucose, Bld: 100 mg/dL — ABNORMAL HIGH (ref 70–99)
Potassium: 4.6 mEq/L (ref 3.5–5.3)
Sodium: 144 mEq/L (ref 135–145)
Total Bilirubin: 0.4 mg/dL (ref 0.2–1.2)
Total Protein: 7.6 g/dL (ref 6.0–8.3)

## 2014-01-06 ENCOUNTER — Ambulatory Visit (INDEPENDENT_AMBULATORY_CARE_PROVIDER_SITE_OTHER): Payer: Medicare Other | Admitting: Family Medicine

## 2014-01-06 ENCOUNTER — Ambulatory Visit (INDEPENDENT_AMBULATORY_CARE_PROVIDER_SITE_OTHER): Payer: Medicare Other

## 2014-01-06 VITALS — BP 120/60 | HR 111 | Temp 98.1°F | Resp 12 | Ht 62.5 in | Wt 135.5 lb

## 2014-01-06 DIAGNOSIS — R1030 Lower abdominal pain, unspecified: Secondary | ICD-10-CM

## 2014-01-06 DIAGNOSIS — K5900 Constipation, unspecified: Secondary | ICD-10-CM

## 2014-01-06 LAB — POCT UA - MICROSCOPIC ONLY
Casts, Ur, LPF, POC: NEGATIVE
Mucus, UA: NEGATIVE
RBC, urine, microscopic: NEGATIVE
WBC, Ur, HPF, POC: NEGATIVE
Yeast, UA: NEGATIVE

## 2014-01-06 LAB — POCT URINALYSIS DIPSTICK
Bilirubin, UA: NEGATIVE
Blood, UA: NEGATIVE
Glucose, UA: NEGATIVE
Ketones, UA: NEGATIVE
Leukocytes, UA: NEGATIVE
Nitrite, UA: NEGATIVE
Protein, UA: NEGATIVE
Spec Grav, UA: <=1.005
Urobilinogen, UA: 0.2
pH, UA: 6.5

## 2014-01-06 LAB — POCT CBC
Granulocyte percent: 59.6 %G (ref 37–80)
HCT, POC: 42.9 % (ref 37.7–47.9)
Hemoglobin: 14.1 g/dL (ref 12.2–16.2)
Lymph, poc: 2.1 (ref 0.6–3.4)
MCH, POC: 30.5 pg (ref 27–31.2)
MCHC: 32.9 g/dL (ref 31.8–35.4)
MCV: 92.6 fL (ref 80–97)
MID (cbc): 0.4 (ref 0–0.9)
MPV: 6.8 fL (ref 0–99.8)
POC Granulocyte: 3.7 (ref 2–6.9)
POC LYMPH PERCENT: 33.7 %L (ref 10–50)
POC MID %: 6.4 %M (ref 0–12)
Platelet Count, POC: 267 10*3/uL (ref 142–424)
RBC: 4.63 M/uL (ref 4.04–5.48)
RDW, POC: 12.8 %
WBC: 6.2 10*3/uL (ref 4.6–10.2)

## 2014-01-06 NOTE — Progress Notes (Signed)
68 yo woman with chronic abdominal pain.  She developed fever and myalgia 10 days ago.  She developed constipation ("which I never have") and back pain and presented on Wednesday and was diagnosed with diverticulitis.   She started Cipro and metronidazole and developed uncon- trollable diarrhea.  She stopped the antibiotics.    She felt a little better yesterday.  No BM in two days.  Back pain has resolved.  Now she hurts in both ower quadrants and epigastrium.  It feels like sharp burning in stomach and cramping in lower abdomen. She feels somewhat weak and tired.  Ate crackers and applesauce today. She then had some water brash from the applesauce. She has hiatal hernia history. She's never had a problem like this before.  Objective: NAD HEENT:  Slightly pale, no obvious abn.  Moist mm Chest clear Heart:  Reg and rapid, no murmur or gallop Abdomen:  Soft but tender in both lower quadrants. There is no rebound or guarding. There is no bloating or distention and no HSM. No masses are palpable. Skin: Warm and dry without rash   CBC Latest Ref Rng 01/06/2014 01/03/2014 03/08/2013  WBC 4.6 - 10.2 K/uL 6.2 7.8 6.2  Hemoglobin 12.2 - 16.2 g/dL 14.1 14.4 13.8  Hematocrit 37.7 - 47.9 % 42.9 42.8 39.6  Platelets 150 - 400 K/uL - - 221     Wt Readings from Last 3 Encounters:  01/06/14 135 lb 8 oz (61.462 kg)  01/03/14 136 lb 6.4 oz (61.871 kg)  10/31/13 136 lb 3.2 oz (61.78 kg)    [facility] reading (PRIMARY) by  Dr. Joseph Art acute abd series:  No acute findings.  Results for orders placed or performed in visit on 01/06/14  POCT CBC  Result Value Ref Range   WBC 6.2 4.6 - 10.2 K/uL   Lymph, poc 2.1 0.6 - 3.4   POC LYMPH PERCENT 33.7 10 - 50 %L   MID (cbc) 0.4 0 - 0.9   POC MID % 6.4 0 - 12 %M   POC Granulocyte 3.7 2 - 6.9   Granulocyte percent 59.6 37 - 80 %G   RBC 4.63 4.04 - 5.48 M/uL   Hemoglobin 14.1 12.2 - 16.2 g/dL   HCT, POC 42.9 37.7 - 47.9 %   MCV 92.6 80 - 97 fL   MCH, POC  30.5 27 - 31.2 pg   MCHC 32.9 31.8 - 35.4 g/dL   RDW, POC 12.8 %   Platelet Count, POC 267 142 - 424 K/uL   MPV 6.8 0 - 99.8 fL  POCT UA - Microscopic Only  Result Value Ref Range   WBC, Ur, HPF, POC neg    RBC, urine, microscopic neg    Bacteria, U Microscopic 1+    Mucus, UA neg    Epithelial cells, urine per micros 0-2    Crystals, Ur, HPF, POC calcium phosphate    Casts, Ur, LPF, POC neg    Yeast, UA neg   POCT urinalysis dipstick  Result Value Ref Range   Color, UA yellow    Clarity, UA clear    Glucose, UA neg    Bilirubin, UA neg    Ketones, UA neg    Spec Grav, UA <=1.005    Blood, UA neg    pH, UA 6.5    Protein, UA neg    Urobilinogen, UA 0.2    Nitrite, UA neg    Leukocytes, UA Negative    Assessment: Patient appears to have a  rectal impaction versus just plain constipation following an episode of colitis.  The colitis seems to be resolved at this point. Antibiotics are not indicated.  Plan: I want patient take MiraLAX and fleets enema and stay on clear liquids for 24 hours until she has a bowel movement.  Return in 24-48 hrs. if symptoms continue    Signed, Carola Frost.D.

## 2014-01-22 ENCOUNTER — Encounter: Payer: Self-pay | Admitting: Internal Medicine

## 2014-01-22 ENCOUNTER — Ambulatory Visit (INDEPENDENT_AMBULATORY_CARE_PROVIDER_SITE_OTHER): Payer: Medicare Other | Admitting: Internal Medicine

## 2014-01-22 ENCOUNTER — Other Ambulatory Visit: Payer: Self-pay | Admitting: Internal Medicine

## 2014-01-22 VITALS — BP 100/62 | HR 74 | Temp 97.1°F | Resp 12 | Wt 134.0 lb

## 2014-01-22 DIAGNOSIS — N951 Menopausal and female climacteric states: Secondary | ICD-10-CM

## 2014-01-22 DIAGNOSIS — E039 Hypothyroidism, unspecified: Secondary | ICD-10-CM

## 2014-01-22 LAB — TSH: TSH: 0.948 u[IU]/mL (ref 0.350–4.500)

## 2014-01-22 LAB — T4, FREE: Free T4: 1.86 ng/dL — ABNORMAL HIGH (ref 0.80–1.80)

## 2014-01-22 NOTE — Progress Notes (Signed)
Patient ID: Mary Rubio, female   DOB: December 17, 1945, 69 y.o.   MRN: [id number]   HPI  Mary Rubio is a 69 y.o.-year-old female, retruning for f/u for hypothyroidism, dx 1996. Last visit in 08/2013.  Pt. is on Synthroid DAW 112 mcg, taken: - fasting - with water - no b'fast  - + calcium with lunch - + Ranitine with lunch  - no iron, multivitamins   I reviewed pt's thyroid tests - very fluctuating over time: Lab Results  Component Value Date   TSH 0.68 10/19/2013   TSH 0.49 08/31/2013   TSH 4.728* 03/08/2013   TSH 9.188* 12/28/2012   TSH 0.299* 09/18/2012   TSH 2.096 04/27/2012   TSH 1.222 09/10/2011   FREET4 1.10 10/19/2013   FREET4 1.00 08/31/2013   FREET4 1.26 03/08/2013   FREET4 1.35 04/27/2012  08/17/2013: TSH 3.45, fT4 0.70 >> she was advised to increase the LT4 to 125 mcg, but she did not do it. She feel better on  112 mcg.  07/04/2013: TSH 6.81 >> dose of LT4 increased from 100 to 112 mcg  She tells me she feels better when TSH is higher, around 2.  Pt describes: - + fatigue  - worse - no palpitations - no weight changes; she started going to the gym - + cold intolerance - + constipation/no diarrhea - + hair loss - + depression/+ anxiety - tried antidepressants (Prozac) >> felt terrible  Pt denies feeling nodules in neck, hoarseness, dysphagia/odynophagia, SOB with lying down.  She had a h/o radiation tx to head or neck (for ear infection >> experimental tx - several tx's)  I reviewed her chart and she also has a history of GERD, low bone density - started Silver sneakers, hiatal hernia.   ROS: Constitutional: see HPI Eyes: no blurry vision, no xerophthalmia ENT: no sore throat, no nodules palpated in throat, no dysphagia/odynophagia, + tinnitus Cardiovascular: no CP/SOB/palpitations/no leg swelling Respiratory: no cough/no SOB Gastrointestinal: no N/V/D/+ C, + heartburn Musculoskeletal: no muscle aches/+ joint aches Skin: no rashes,+ hair  loss Neurological: no tremors/numbness/tingling/dizziness  I reviewed pt's medications, allergies, PMH, social hx, family hx, and changes were documented in the history of present illness. Otherwise, unchanged from my initial visit note: Past Medical History  Diagnosis Date  . Environmental allergies     Weekly allergy shots  . Depression   . GERD (gastroesophageal reflux disease)   . Hypothyroidism   . Anxiety   . Hyperlipidemia   . Hiatal hernia   . Asthma    Past Surgical History  Procedure Laterality Date  . Abdominal hysterectomy    . Cholecystectomy    . Bunionectomy    . Thumb surgery      for cyst  . Exploratory laparotomy      endometriosis  . Bartholin gland cyst excision    . Facial cyst      excision  . Esophagogastroduodenoscopy N/A 10/21/2012    Procedure: ESOPHAGOGASTRODUODENOSCOPY (EGD);  Surgeon: Beryle Beams, MD;  Location: [facility];  Service: Endoscopy;  Laterality: N/A;   History   Social History  . Marital Status: Single    Spouse Name: N/A    Number of Children: 1  . Years of Education: college   Occupational History  . caregiver    Social History Main Topics  . Smoking status: Never Smoker   . Smokeless tobacco: No  . Alcohol Use: No  . Drug Use: No   Current Outpatient Prescriptions on File Prior to Visit  Medication Sig Dispense Refill  . Bepotastine Besilate (BEPREVE OP) Apply to eye daily.    . calcium-vitamin D (OSCAL WITH D) 500-200 MG-UNIT per tablet Take 1 tablet by mouth daily. 90 tablet 3  . ciprofloxacin (CIPRO) 500 MG tablet Take 1 tablet (500 mg total) by mouth 2 (two) times daily. 10 tablet 0  . clonazePAM (KLONOPIN) 0.5 MG tablet Take 0.5 tablets (0.25 mg total) by mouth 2 (two) times daily as needed for anxiety. 20 tablet 0  . FLUoxetine (PROZAC) 10 MG tablet Take 1 tablet (10 mg total) by mouth daily. 30 tablet 3  . ipratropium (ATROVENT) 0.03 % nasal spray Place 2 sprays into the nose 4 (four) times daily. 30 mL 6   . levothyroxine (SYNTHROID) 112 MCG tablet Take 112 mcg by mouth daily before breakfast.    . metroNIDAZOLE (FLAGYL) 500 MG tablet Take 1 tablet (500 mg total) by mouth 2 (two) times daily with a meal. DO NOT CONSUME ALCOHOL WHILE TAKING THIS MEDICATION. 14 tablet 0  . Olopatadine HCl (PAZEO) 0.7 % SOLN Apply to eye.    . ranitidine (ZANTAC) 150 MG tablet Take 1 tablet (150 mg total) by mouth 2 (two) times daily. 180 tablet 3   No current facility-administered medications on file prior to visit.   Allergies  Allergen Reactions  . Augmentin [Amoxicillin-Pot Clavulanate]   . Crestor [Rosuvastatin]     Myalgias  . Latex Other (See Comments)    Canker sores in mouth (discovered by dentist)  . Meperidine Hcl Other (See Comments)    Does not remember   . Morphine And Related Itching    Has tolerated small doses of hydrocodone  . Pentazocine Lactate Nausea And Vomiting    Sick on stomach  . Simvastatin Other (See Comments)    Muscle aches   . Codeine Rash    Has tolerated small doses of hydrocodone  . Morphine Rash   Family History  Problem Relation Age of Onset  . Cancer Mother   . Hypertension Mother   . Cancer Father   . Dementia Father   . Heart disease Sister   . Thyroid disease Daughter   . Heart disease Maternal Grandfather   . Heart disease Paternal Grandmother   . Thyroid disease Paternal Grandfather   . Arrhythmia Father   . CAD Sister     MI/stent age 91, CABG age 61  . CAD      Several relatives on dad's side have had MIs/CABG   PE: BP 100/62 mmHg  Pulse 74  Temp(Src) 97.1 F (36.2 C) (Oral)  Resp 12  Wt 134 lb (60.782 kg)  SpO2 97% Wt Readings from Last 3 Encounters:  01/22/14 134 lb (60.782 kg)  01/06/14 135 lb 8 oz (61.462 kg)  01/03/14 136 lb 6.4 oz (61.871 kg)   Constitutional: normal weight, in NAD Eyes: PERRLA, EOMI, no exophthalmos ENT: moist mucous membranes, no thyromegaly, no cervical lymphadenopathy Cardiovascular: RRR, No  MRG Respiratory: CTA B Gastrointestinal: abdomen soft, NT, ND, BS+ Musculoskeletal: no deformities, strength intact in all 4 Skin: moist, warm, no rashes Neurological: slight tremor with outstretched hands, DTR normal in all 4  ASSESSMENT: 1. Hypothyroidism - Thyroid U/S (03/17/2013) - a nonenlarged thyroid, with very small hypoechoic regions:  Right lobe: 4.2 x 1.5 x 1.2 cm. There are multiple hypoechogenic to anechoic nodules, measuring under 2 mm in diameter  Left lobe: 3.9 x 1.3 x 1.5 cm. There are hypoechoic nodules in the mid pole and lower  pole of the left thyroid lobe, with the largest nodule measuring 4 mm in diameter. There is a minimal 1 x 3 mm diameter shadowing calcification in the midpole >> no goiter, small hypoechoic areas, all <4 mm >> I advised her that no follow up is needed for this   2. Hot flushes  PLAN:  1. Patient with long-standing hypothyroidism, on Synthroid, with fluctuating thyroid hh levels. She appears euthyroid but feels fatigued >> she would like to keep TSH around 2-3 as she feels better at that level. - She does not appear to have a goiter, thyroid nodules, or neck compression symptoms. - We discussed about correct intake of levothyroxine, fasting, with water, separated by at least 30 minutes from breakfast, and separated by more than 4 hours from calcium, iron, multivitamins, acid reflux medications (PPIs). She is taking it correctly. - will check thyroid tests today: TSH, free T4 - continue Synthroid DAW 112 mcg daily for now - I will see her back in 6 months  2. Hot flushes - I again suggested Relizen and given her info about this  Orders Only on 01/22/2014  Component Date Value Ref Range Status  . TSH 01/22/2014 0.948  0.350 - 4.500 uIU/mL Final  . Free T4 01/22/2014 1.86* 0.80 - 1.80 ng/dL Final   TSH normal, but, per her preference, will aim a higher target >> decrease the LT4 dose to 100 mcg >> will send this >> will advise her to return for  labs in 5-6 weeks.

## 2014-01-22 NOTE — Patient Instructions (Signed)
Please come back for a follow-up appointment in 6 months. Please stop at [facility] lab downstairs.

## 2014-01-23 MED ORDER — SYNTHROID 100 MCG PO TABS: 100.0000 ug | ORAL_TABLET | Freq: Every day | ORAL | Status: DC

## 2014-02-06 ENCOUNTER — Ambulatory Visit (INDEPENDENT_AMBULATORY_CARE_PROVIDER_SITE_OTHER): Payer: Medicare Other | Admitting: Emergency Medicine

## 2014-02-06 VITALS — BP 158/92 | HR 119 | Temp 97.7°F | Resp 24

## 2014-02-06 DIAGNOSIS — R0602 Shortness of breath: Secondary | ICD-10-CM

## 2014-02-06 DIAGNOSIS — J4541 Moderate persistent asthma with (acute) exacerbation: Secondary | ICD-10-CM

## 2014-02-06 MED ORDER — ALBUTEROL SULFATE HFA 108 (90 BASE) MCG/ACT IN AERS: 2.0000 | INHALATION_SPRAY | RESPIRATORY_TRACT | Status: DC | PRN

## 2014-02-06 MED ORDER — IPRATROPIUM BROMIDE 0.02 % IN SOLN
0.5000 mg | Freq: Once | RESPIRATORY_TRACT | Status: AC
  Administered 2014-02-06: 0.5 mg via RESPIRATORY_TRACT

## 2014-02-06 MED ORDER — ALBUTEROL SULFATE (2.5 MG/3ML) 0.083% IN NEBU
5.0000 mg | INHALATION_SOLUTION | Freq: Once | RESPIRATORY_TRACT | Status: AC
  Administered 2014-02-06: 5 mg via RESPIRATORY_TRACT

## 2014-02-06 NOTE — Patient Instructions (Signed)

## 2014-02-06 NOTE — Progress Notes (Signed)
[facility] [address], [place] [address] [phone]  Date:  02/06/2014   Name:  Mary Rubio   DOB:  May 02, 1945   MRN:  [id number]  PCP:  [name], MD    Chief Complaint: Shortness of Breath   History of Present Illness:  Mary Rubio is a 69 y.o. very pleasant female patient who presents with the following:  History of asthma.   Has not used her inhaler in a year. Tonight ate a cheese sandwich at a diner and developed some wheezing. Went outside and wheezing worsened.  No facial swelling or difficulty speaking or swallowing No coryza.  No fever or chills Used her inhaler once tonight and had no improvement No improvement with over the counter medications or other home remedies.  Denies other complaint or health concern today.   Patient Active Problem List   Diagnosis Date Noted  . Vaginal lesion 04/12/2013  . Dysphagia 05/26/2012  . Esophageal reflux 05/26/2012  . Diaphragmatic hernia 05/26/2012  . 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 Precordial pain 05/12/2012  . Hypothyroidism 08/14/2007  . HYPERLIPIDEMIA 08/14/2007  . ALLERGIC RHINITIS 08/14/2007  . ASTHMA 08/14/2007    Past Medical History  Diagnosis Date  . Environmental allergies     Weekly allergy shots  . Depression   . GERD (gastroesophageal reflux disease)   . Hypothyroidism   . Anxiety   . Hyperlipidemia   . Hiatal hernia   . Asthma     Past Surgical History  Procedure Laterality Date  . Abdominal hysterectomy    . Cholecystectomy    . Bunionectomy    . Thumb surgery      for cyst  . Exploratory laparotomy      endometriosis  . Bartholin gland cyst excision    . Facial cyst      excision  . Esophagogastroduodenoscopy N/A 10/21/2012    Procedure: ESOPHAGOGASTRODUODENOSCOPY (EGD);  Surgeon: Beryle Beams, MD;  Location: [facility];  Service: Endoscopy;  Laterality: N/A;    History  Substance Use Topics  . Smoking status: Never  Smoker   . Smokeless tobacco: Never Used  . Alcohol Use: No    Family History  Problem Relation Age of Onset  . Cancer Mother   . Hypertension Mother   . Cancer Father   . Dementia Father   . Heart disease Sister   . Thyroid disease Daughter   . Heart disease Maternal Grandfather   . Heart disease Paternal Grandmother   . Thyroid disease Paternal Grandfather   . Arrhythmia Father   . CAD Sister     MI/stent age 15, CABG age 68  . CAD      Several relatives on dad's side have had MIs/CABG    Allergies  Allergen Reactions  . Augmentin [Amoxicillin-Pot Clavulanate]   . Crestor [Rosuvastatin]     Myalgias  . Latex Other (See Comments)    Canker sores in mouth (discovered by dentist)  . Meperidine Hcl Other (See Comments)    Does not remember   . Morphine And Related Itching    Has tolerated small doses of hydrocodone  . Pentazocine Lactate Nausea And Vomiting    Sick on stomach  . Simvastatin Other (See Comments)    Muscle aches   . Codeine Rash    Has tolerated small doses of hydrocodone  . Morphine Rash    Medication list has been reviewed and updated.  Current Outpatient Prescriptions on File Prior to  Visit  Medication Sig Dispense Refill  . calcium-vitamin D (OSCAL WITH D) 500-200 MG-UNIT per tablet Take 1 tablet by mouth daily. 90 tablet 3  . ipratropium (ATROVENT) 0.03 % nasal spray Place 2 sprays into the nose 4 (four) times daily. 30 mL 6  . Olopatadine HCl (PAZEO) 0.7 % SOLN Apply to eye.    Marland Kitchen SYNTHROID 100 MCG tablet Take 1 tablet (100 mcg total) by mouth daily before breakfast. 60 tablet 2  . Bepotastine Besilate (BEPREVE OP) Apply to eye daily.    Marland Kitchen FLUoxetine (PROZAC) 10 MG tablet Take 1 tablet (10 mg total) by mouth daily. (Patient not taking: Reported on 02/06/2014) 30 tablet 3  . ranitidine (ZANTAC) 150 MG tablet Take 1 tablet (150 mg total) by mouth 2 (two) times daily. (Patient not taking: Reported on 02/06/2014) 180 tablet 3   No current  facility-administered medications on file prior to visit.    Review of Systems:  As per HPI, otherwise negative.    Physical Examination: Filed Vitals:   02/06/14 2026  BP: 158/92  Pulse: 119  Temp: 97.7 F (36.5 C)  Resp: 24   There were no vitals filed for this visit. There is no weight on file to calculate BMI. Ideal Body Weight:    GEN: WDWN, NAD, Non-toxic, A & O x 3 HEENT: Atraumatic, Normocephalic. Neck supple. No masses, No LAD. Ears and Nose: No external deformity. CV: RRR, No M/G/R. No JVD. No thrill. No extra heart sounds. PULM: diffuse wheezes, poor air movement crackles, rhonchi. No retractions. No resp. distress. No accessory muscle use. ABD: S, NT, ND, +BS. No rebound. No HSM. EXTR: No c/c/e NEURO Normal gait.  PSYCH: Normally interactive. Conversant. Not depressed or anxious appearing.  Calm demeanor.    Assessment and Plan: Exacerbation asthma Possibly cold induced Refill albuterol Marked improvement following neb  Signed,  Ellison Carwin, MD

## 2014-02-15 ENCOUNTER — Telehealth: Payer: Self-pay

## 2014-02-15 DIAGNOSIS — J301 Allergic rhinitis due to pollen: Secondary | ICD-10-CM | POA: Diagnosis not present

## 2014-02-15 DIAGNOSIS — R0602 Shortness of breath: Secondary | ICD-10-CM | POA: Diagnosis not present

## 2014-02-15 DIAGNOSIS — J3089 Other allergic rhinitis: Secondary | ICD-10-CM | POA: Diagnosis not present

## 2014-02-15 DIAGNOSIS — H1045 Other chronic allergic conjunctivitis: Secondary | ICD-10-CM | POA: Diagnosis not present

## 2014-02-15 NOTE — Telephone Encounter (Signed)
Tonya from [place] (not on epic) needs office notes for this pt faxed to [phone]. Last office note.

## 2014-02-16 NOTE — Telephone Encounter (Signed)
Records faxed thru Epic.

## 2014-02-27 ENCOUNTER — Other Ambulatory Visit (INDEPENDENT_AMBULATORY_CARE_PROVIDER_SITE_OTHER): Payer: Medicare Other

## 2014-02-27 DIAGNOSIS — E039 Hypothyroidism, unspecified: Secondary | ICD-10-CM

## 2014-02-27 LAB — TSH: TSH: 1.84 u[IU]/mL (ref 0.35–4.50)

## 2014-02-27 LAB — T4, FREE: Free T4: 0.99 ng/dL (ref 0.60–1.60)

## 2014-03-12 DIAGNOSIS — Z1231 Encounter for screening mammogram for malignant neoplasm of breast: Secondary | ICD-10-CM | POA: Diagnosis not present

## 2014-03-28 ENCOUNTER — Encounter: Payer: Self-pay | Admitting: Internal Medicine

## 2014-04-02 ENCOUNTER — Ambulatory Visit (INDEPENDENT_AMBULATORY_CARE_PROVIDER_SITE_OTHER): Payer: Medicare Other

## 2014-04-02 ENCOUNTER — Ambulatory Visit (INDEPENDENT_AMBULATORY_CARE_PROVIDER_SITE_OTHER): Payer: Medicare Other | Admitting: Family Medicine

## 2014-04-02 VITALS — BP 118/70 | HR 81 | Temp 97.9°F | Resp 16 | Ht 64.0 in | Wt 135.0 lb

## 2014-04-02 DIAGNOSIS — R0789 Other chest pain: Secondary | ICD-10-CM

## 2014-04-02 DIAGNOSIS — S2232XA Fracture of one rib, left side, initial encounter for closed fracture: Secondary | ICD-10-CM

## 2014-04-02 MED ORDER — HYDROCOD POLST-CHLORPHEN POLST 10-8 MG/5ML PO LQCR: 5.0000 mL | Freq: Two times a day (BID) | ORAL | Status: DC | PRN

## 2014-04-02 NOTE — Patient Instructions (Signed)

## 2014-04-02 NOTE — Progress Notes (Signed)
Subjective:    Patient ID: Mary Rubio, female    DOB: [birth year], 69 y.o.   MRN: [id number]  HPI Chief Complaint  Patient presents with   Rib Injury    heard a pop, x 5 days, left side   This chart was scribed for Robyn Haber, MD by Thea Alken, ED Scribe. This patient was seen in room 11 and the patient's care was started at 5:07 PM.  HPI Comments: Mary Rubio is a 69 y.o. female who presents to the [facility] complaining of a left rib injury that occurred 4-5 days ago. Pt states while bending left laterally, trying to feed a stray cat under her car, she heard a "pop" to left ribs. Pt has worsening pain today that radiates to left back. She reports pain worsens with touch, deep breaths and coughing. Pt reports recent bone density test but does not know the results. 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   Past Medical History  Diagnosis Date   Environmental allergies     Weekly allergy shots   Depression    GERD (gastroesophageal reflux disease)    Hypothyroidism    Anxiety    Hyperlipidemia    Hiatal hernia    Asthma    Past Surgical History  Procedure Laterality Date   Abdominal hysterectomy     Cholecystectomy     Bunionectomy     Thumb surgery      for cyst   Exploratory laparotomy      endometriosis   Bartholin gland cyst excision     Facial cyst      excision   Esophagogastroduodenoscopy N/A 10/21/2012    Procedure: ESOPHAGOGASTRODUODENOSCOPY (EGD);  Surgeon: Beryle Beams, MD;  Location: [facility];  Service: Endoscopy;  Laterality: N/A;   Prior to Admission medications   Medication Sig Start Date End Date Taking? Authorizing Provider  albuterol (PROVENTIL HFA;VENTOLIN HFA) 108 (90 BASE) MCG/ACT inhaler Inhale 2 puffs into the lungs every 4 (four) hours as needed for wheezing or shortness of breath (cough, shortness of breath or wheezing.). 02/06/14  Yes Roselee Culver, MD  Bepotastine  Besilate (BEPREVE OP) Apply to eye daily.   Yes Historical Provider, MD  calcium-vitamin D (OSCAL WITH D) 500-200 MG-UNIT per tablet Take 1 tablet by mouth daily. 06/28/13  Yes Eleanore E Elana Alm, PA-C  FLUoxetine (PROZAC) 10 MG tablet Take 1 tablet (10 mg total) by mouth daily. 10/31/13  Yes Elby Beck, FNP  ipratropium (ATROVENT) 0.03 % nasal spray Place 2 sprays into the nose 4 (four) times daily. 12/28/12  Yes Gay Filler Copland, MD  Olopatadine HCl (PAZEO) 0.7 % SOLN Apply to eye.   Yes Historical Provider, MD  ranitidine (ZANTAC) 150 MG tablet Take 1 tablet (150 mg total) by mouth 2 (two) times daily. 09/24/12  Yes Orma Flaming, MD  SYNTHROID 100 MCG tablet Take 1 tablet (100 mcg total) by mouth daily before breakfast. 01/23/14  Yes Philemon Kingdom, MD    Review of Systems  Musculoskeletal: Positive for back pain and arthralgias.       Objective:   Physical Exam  Constitutional: She is oriented to person, place, and time. She appears well-developed and well-nourished. No distress.  HENT:  Head: Normocephalic and atraumatic.  Eyes: Conjunctivae and EOM are normal.  Neck: Neck supple.  Cardiovascular: Normal rate.   Pulmonary/Chest: Effort normal.  Faint crepitus on left with expiration. Great tenderness to left 11th and 12th  ribs to left lateral chest.   Musculoskeletal: Normal range of motion.  Neurological: She is alert and oriented to person, place, and time.  Skin: Skin is warm and dry.  Psychiatric: She has a normal mood and affect. Her behavior is normal.  Nursing note and vitals reviewed.  Filed Vitals:   04/02/14 1704  BP: 118/70  Pulse: 81  Temp: 97.9 F (36.6 C)  Resp: 16   [facility] reading (PRIMARY) by Dr. Joseph Art. 12, 11, 10, 9 left rib fracture 9 with marked osteoporotic features no effusion and no pneumo   Assessment & Plan:   This chart was scribed in my presence and reviewed by me personally.    ICD-9-CM ICD-10-CM   1. Chest wall pain 786.52 R07.89 DG  Ribs Unilateral W/Chest Left     chlorpheniramine-HYDROcodone (TUSSIONEX PENNKINETIC ER) 10-8 MG/5ML LQCR  2. Rib fracture, left, closed, initial encounter 807.00 S22.32XA chlorpheniramine-HYDROcodone (TUSSIONEX PENNKINETIC ER) 10-8 MG/5ML LQCR     Signed, Robyn Haber, MD

## 2014-04-09 ENCOUNTER — Emergency Department (HOSPITAL_COMMUNITY)
Admission: EM | Admit: 2014-04-09 | Discharge: 2014-04-09 | Disposition: A | Discharge status: Home / Self Care | Payer: Medicare Other | Attending: Emergency Medicine | Admitting: Emergency Medicine

## 2014-04-09 ENCOUNTER — Encounter (HOSPITAL_COMMUNITY): Payer: Self-pay | Admitting: Emergency Medicine

## 2014-04-09 ENCOUNTER — Emergency Department (HOSPITAL_COMMUNITY): Payer: Medicare Other

## 2014-04-09 DIAGNOSIS — Z79899 Other long term (current) drug therapy: Secondary | ICD-10-CM | POA: Insufficient documentation

## 2014-04-09 DIAGNOSIS — Z8639 Personal history of other endocrine, nutritional and metabolic disease: Secondary | ICD-10-CM | POA: Insufficient documentation

## 2014-04-09 DIAGNOSIS — J45909 Unspecified asthma, uncomplicated: Secondary | ICD-10-CM | POA: Insufficient documentation

## 2014-04-09 DIAGNOSIS — R109 Unspecified abdominal pain: Secondary | ICD-10-CM | POA: Insufficient documentation

## 2014-04-09 DIAGNOSIS — S299XXA Unspecified injury of thorax, initial encounter: Secondary | ICD-10-CM | POA: Diagnosis not present

## 2014-04-09 DIAGNOSIS — R0602 Shortness of breath: Secondary | ICD-10-CM | POA: Diagnosis not present

## 2014-04-09 DIAGNOSIS — F329 Major depressive disorder, single episode, unspecified: Secondary | ICD-10-CM | POA: Diagnosis not present

## 2014-04-09 DIAGNOSIS — Z91040 Latex allergy status: Secondary | ICD-10-CM | POA: Insufficient documentation

## 2014-04-09 DIAGNOSIS — R0789 Other chest pain: Secondary | ICD-10-CM | POA: Diagnosis not present

## 2014-04-09 DIAGNOSIS — S3991XA Unspecified injury of abdomen, initial encounter: Secondary | ICD-10-CM | POA: Diagnosis not present

## 2014-04-09 DIAGNOSIS — R079 Chest pain, unspecified: Secondary | ICD-10-CM | POA: Diagnosis not present

## 2014-04-09 DIAGNOSIS — K219 Gastro-esophageal reflux disease without esophagitis: Secondary | ICD-10-CM | POA: Diagnosis not present

## 2014-04-09 LAB — I-STAT TROPONIN, ED: Troponin i, poc: 0 ng/mL (ref 0.00–0.08)

## 2014-04-09 LAB — CBC WITH DIFFERENTIAL/PLATELET
Basophils Absolute: 0.1 10*3/uL (ref 0.0–0.1)
Basophils Relative: 1 % (ref 0–1)
Eosinophils Absolute: 0.1 10*3/uL (ref 0.0–0.7)
Eosinophils Relative: 1 % (ref 0–5)
HCT: 44.9 % (ref 36.0–46.0)
Hemoglobin: 15.3 g/dL — ABNORMAL HIGH (ref 12.0–15.0)
Lymphocytes Relative: 27 % (ref 12–46)
Lymphs Abs: 1.7 10*3/uL (ref 0.7–4.0)
MCH: 31 pg (ref 26.0–34.0)
MCHC: 34.1 g/dL (ref 30.0–36.0)
MCV: 90.9 fL (ref 78.0–100.0)
Monocytes Absolute: 0.4 10*3/uL (ref 0.1–1.0)
Monocytes Relative: 6 % (ref 3–12)
Neutro Abs: 4 10*3/uL (ref 1.7–7.7)
Neutrophils Relative %: 65 % (ref 43–77)
Platelets: 246 10*3/uL (ref 150–400)
RBC: 4.94 MIL/uL (ref 3.87–5.11)
RDW: 12.6 % (ref 11.5–15.5)
WBC: 6.2 10*3/uL (ref 4.0–10.5)

## 2014-04-09 LAB — COMPREHENSIVE METABOLIC PANEL
ALT: 14 U/L (ref 0–35)
AST: 24 U/L (ref 0–37)
Albumin: 5.5 g/dL — ABNORMAL HIGH (ref 3.5–5.2)
Alkaline Phosphatase: 72 U/L (ref 39–117)
Anion gap: 9 (ref 5–15)
BUN: 11 mg/dL (ref 6–23)
CO2: 25 mmol/L (ref 19–32)
Calcium: 9.5 mg/dL (ref 8.4–10.5)
Chloride: 104 mmol/L (ref 96–112)
Creatinine, Ser: 1.04 mg/dL (ref 0.50–1.10)
GFR calc Af Amer: 63 mL/min — ABNORMAL LOW (ref >90)
GFR calc non Af Amer: 55 mL/min — ABNORMAL LOW (ref >90)
Glucose, Bld: 116 mg/dL — ABNORMAL HIGH (ref 70–99)
Potassium: 3.5 mmol/L (ref 3.5–5.1)
Sodium: 138 mmol/L (ref 135–145)
Total Bilirubin: 0.4 mg/dL (ref 0.3–1.2)
Total Protein: 9.4 g/dL — ABNORMAL HIGH (ref 6.0–8.3)

## 2014-04-09 LAB — URINALYSIS, ROUTINE W REFLEX MICROSCOPIC
Bilirubin Urine: NEGATIVE
Glucose, UA: NEGATIVE mg/dL
Ketones, ur: NEGATIVE mg/dL
Leukocytes, UA: NEGATIVE
Nitrite: NEGATIVE
Protein, ur: NEGATIVE mg/dL
Specific Gravity, Urine: 1.004 — ABNORMAL LOW (ref 1.005–1.030)
Urobilinogen, UA: 0.2 mg/dL (ref 0.0–1.0)
pH: 6.5 (ref 5.0–8.0)

## 2014-04-09 LAB — URINE MICROSCOPIC-ADD ON

## 2014-04-09 LAB — LIPASE, BLOOD: Lipase: 28 U/L (ref 11–59)

## 2014-04-09 MED ORDER — ACETAMINOPHEN 325 MG PO TABS
650.0000 mg | ORAL_TABLET | Freq: Once | ORAL | Status: AC
  Administered 2014-04-09: 650 mg via ORAL
  Filled 2014-04-09: qty 2

## 2014-04-09 NOTE — Discharge Instructions (Signed)

## 2014-04-09 NOTE — ED Notes (Signed)
PA at bedside.

## 2014-04-09 NOTE — ED Provider Notes (Signed)
CSN: [id number]     Arrival date & time 04/09/14  1446 History   First MD Initiated Contact with Patient 04/09/14 1507     Chief Complaint  Patient presents with  . Rib Fracture    9th rib on left side     (Consider location/radiation/quality/duration/timing/severity/associated sxs/prior Treatment) Patient is a 69 y.o. female presenting with chest pain. The history is provided by the patient. No language interpreter was used.  Chest Pain Pain location:  L lateral chest Pain quality: aching   Pain radiates to:  Does not radiate Pain radiates to the back: no   Pain severity:  No pain Onset quality:  Gradual Duration:  12 days Timing:  Constant Progression:  Worsening Chronicity:  New Context: breathing   Relieved by:  Nothing Worsened by:  Nothing tried Ineffective treatments:  None tried Risk factors: no smoking     Past Medical History  Diagnosis Date  . Environmental allergies     Weekly allergy shots  . Depression   . GERD (gastroesophageal reflux disease)   . Hypothyroidism   . Anxiety   . Hyperlipidemia   . Hiatal hernia   . Asthma    Past Surgical History  Procedure Laterality Date  . Abdominal hysterectomy    . Cholecystectomy    . Bunionectomy    . Thumb surgery      for cyst  . Exploratory laparotomy      endometriosis  . Bartholin gland cyst excision    . Facial cyst      excision  . Esophagogastroduodenoscopy N/A 10/21/2012    Procedure: ESOPHAGOGASTRODUODENOSCOPY (EGD);  Surgeon: Beryle Beams, MD;  Location: [facility];  Service: Endoscopy;  Laterality: N/A;   Family History  Problem Relation Age of Onset  . Cancer Mother   . Hypertension Mother   . Cancer Father   . Dementia Father   . Heart disease Sister   . Thyroid disease Daughter   . Heart disease Maternal Grandfather   . Heart disease Paternal Grandmother   . Thyroid disease Paternal Grandfather   . Arrhythmia Father   . CAD Sister     MI/stent age 92, CABG age 57  . CAD      Several relatives on dad's side have had MIs/CABG   History  Substance Use Topics  . Smoking status: Never Smoker   . Smokeless tobacco: Never Used  . Alcohol Use: No   OB History    Gravida Para Term Preterm AB TAB SAB Ectopic Multiple Living   2 2 2       1       Obstetric Comments   One miscarriage     Review of Systems  Cardiovascular: Positive for chest pain.  All other systems reviewed and are negative.     Allergies  Augmentin; Crestor; Latex; Meperidine hcl; Morphine and related; Pentazocine lactate; Simvastatin; Codeine; and Morphine  Home Medications   Prior to Admission medications   Medication Sig Start Date End Date Taking? Authorizing Provider  albuterol (PROVENTIL HFA;VENTOLIN HFA) 108 (90 BASE) MCG/ACT inhaler Inhale 2 puffs into the lungs every 4 (four) hours as needed for wheezing or shortness of breath (cough, shortness of breath or wheezing.). 02/06/14  Yes Roselee Culver, MD  calcium-vitamin D (OSCAL WITH D) 500-200 MG-UNIT per tablet Take 1 tablet by mouth daily. 06/28/13  Yes Eleanore Kurtis Bushman, PA-C  Polyethyl Glycol-Propyl Glycol (SYSTANE OP) Place 1 drop into both eyes daily.   Yes Historical Provider, MD  ranitidine (ZANTAC) 150 MG tablet Take 1 tablet (150 mg total) by mouth 2 (two) times daily. 09/24/12  Yes Orma Flaming, MD  SYNTHROID 100 MCG tablet Take 1 tablet (100 mcg total) by mouth daily before breakfast. 01/23/14  Yes Philemon Kingdom, MD  chlorpheniramine-HYDROcodone [facility] PENNKINETIC ER) 10-8 MG/5ML LQCR Take 5 mLs by mouth every 12 (twelve) hours as needed. 04/02/14   Robyn Haber, MD  FLUoxetine (PROZAC) 10 MG tablet Take 1 tablet (10 mg total) by mouth daily. 10/31/13   Elby Beck, FNP  ipratropium (ATROVENT) 0.03 % nasal spray Place 2 sprays into the nose 4 (four) times daily. 12/28/12   Gay Filler Copland, MD   BP 147/83 mmHg  Pulse 117  Temp(Src) 98 F (36.7 C) (Oral)  Resp 21  SpO2 100% Physical Exam  Constitutional:  She is oriented to person, place, and time. She appears well-developed.  HENT:  Head: Normocephalic.  Right Ear: External ear normal.  Left Ear: External ear normal.  Nose: Nose normal.  Mouth/Throat: Oropharynx is clear and moist.  Eyes: Conjunctivae and EOM are normal. Pupils are equal, round, and reactive to light.  Neck: Normal range of motion. Neck supple.  Cardiovascular: Normal rate and normal heart sounds.   Pulmonary/Chest: Effort normal and breath sounds normal.  Abdominal: Soft.  Neurological: She is alert and oriented to person, place, and time.  Skin: Skin is warm.  Psychiatric: She has a normal mood and affect.  Nursing note and vitals reviewed.   ED Course  Procedures (including critical care time) Labs Review Labs Reviewed  CBC WITH DIFFERENTIAL/PLATELET  COMPREHENSIVE METABOLIC PANEL  LIPASE, BLOOD  URINALYSIS, ROUTINE W REFLEX MICROSCOPIC  I-STAT TROPOININ, ED    Imaging Review No results found.   EKG Interpretation None     Results for orders placed or performed during the hospital encounter of 04/09/14  CBC with Differential  Result Value Ref Range   WBC 6.2 4.0 - 10.5 K/uL   RBC 4.94 3.87 - 5.11 MIL/uL   Hemoglobin 15.3 (H) 12.0 - 15.0 g/dL   HCT 44.9 36.0 - 46.0 %   MCV 90.9 78.0 - 100.0 fL   MCH 31.0 26.0 - 34.0 pg   MCHC 34.1 30.0 - 36.0 g/dL   RDW 12.6 11.5 - 15.5 %   Platelets 246 150 - 400 K/uL   Neutrophils Relative % 65 43 - 77 %   Neutro Abs 4.0 1.7 - 7.7 K/uL   Lymphocytes Relative 27 12 - 46 %   Lymphs Abs 1.7 0.7 - 4.0 K/uL   Monocytes Relative 6 3 - 12 %   Monocytes Absolute 0.4 0.1 - 1.0 K/uL   Eosinophils Relative 1 0 - 5 %   Eosinophils Absolute 0.1 0.0 - 0.7 K/uL   Basophils Relative 1 0 - 1 %   Basophils Absolute 0.1 0.0 - 0.1 K/uL  Comprehensive metabolic panel  Result Value Ref Range   Sodium 138 135 - 145 mmol/L   Potassium 3.5 3.5 - 5.1 mmol/L   Chloride 104 96 - 112 mmol/L   CO2 25 19 - 32 mmol/L   Glucose,  Bld 116 (H) 70 - 99 mg/dL   BUN 11 6 - 23 mg/dL   Creatinine, Ser 1.04 0.50 - 1.10 mg/dL   Calcium 9.5 8.4 - 10.5 mg/dL   Total Protein 9.4 (H) 6.0 - 8.3 g/dL   Albumin 5.5 (H) 3.5 - 5.2 g/dL   AST 24 0 - 37 U/L  ALT 14 0 - 35 U/L   Alkaline Phosphatase 72 39 - 117 U/L   Total Bilirubin 0.4 0.3 - 1.2 mg/dL   GFR calc non Af Amer 55 (L) >90 mL/min   GFR calc Af Amer 63 (L) >90 mL/min   Anion gap 9 5 - 15  Lipase, blood  Result Value Ref Range   Lipase 28 11 - 59 U/L  Urinalysis, Routine w reflex microscopic  Result Value Ref Range   Color, Urine YELLOW YELLOW   APPearance CLEAR CLEAR   Specific Gravity, Urine 1.004 (L) 1.005 - 1.030   pH 6.5 5.0 - 8.0   Glucose, UA NEGATIVE NEGATIVE mg/dL   Hgb urine dipstick TRACE (A) NEGATIVE   Bilirubin Urine NEGATIVE NEGATIVE   Ketones, ur NEGATIVE NEGATIVE mg/dL   Protein, ur NEGATIVE NEGATIVE mg/dL   Urobilinogen, UA 0.2 0.0 - 1.0 mg/dL   Nitrite NEGATIVE NEGATIVE   Leukocytes, UA NEGATIVE NEGATIVE  Urine microscopic-add on  Result Value Ref Range   RBC / HPF 0-2 <3 RBC/hpf  I-stat troponin, ED  Result Value Ref Range   Troponin i, poc 0.00 0.00 - 0.08 ng/mL   Comment 3           Dg Ribs Unilateral W/chest Left  04/02/2014   CLINICAL DATA:  Left anterior rib pain  EXAM: LEFT RIBS AND CHEST - 3+ VIEW  COMPARISON:  None.  FINDINGS: Lungs are clear.  No pleural effusion or pneumothorax.  The heart is normal in size.  Healed left lateral 9th rib fracture deformity with callus formation.  No acute/displaced left rib fracture is seen.  IMPRESSION: No acute/displaced left rib fracture is seen.  Healed left lateral 9th rib fracture deformity.   Electronically Signed   By: Julian Hy M.D.   On: 04/02/2014 18:36   Dg Abd Acute W/chest  04/09/2014   CLINICAL DATA:  Chest pain and shortness of Breath. Bending injury 1 week ago.  EXAM: ACUTE ABDOMEN SERIES (ABDOMEN 2 VIEW & CHEST 1 VIEW)  COMPARISON:  04/02/2014  FINDINGS: The upright chest  x-ray is limited by EKG leads. No obvious acute pulmonary findings.  Two views of the abdomen demonstrate a relative paucity of bowel gas. There is some scattered air and stool in the colon. The soft tissue shadows are maintained. No worrisome calcifications. The bony structures are unremarkable.  IMPRESSION: No acute cardiopulmonary findings and unremarkable abdominal radiographs.   Electronically Signed   By: Marijo Sanes M.D.   On: 04/09/2014 17:49    MDM   Final diagnoses:  Abdominal pain  Chest pain    Pt advised to follow up with her Md for recheck in 1 week.   Pt is afraid to take any pain medications.  Pt took 1 ml on tussionex and it made her loopy.   Pt given 2 tylenol here.  Pt advised of results.      Hollace Kinnier [place], PA-C 04/09/14 1811  Quintella Reichert, MD 04/09/14 2318

## 2014-04-09 NOTE — ED Notes (Signed)
Patient transported to X-ray 

## 2014-04-09 NOTE — ED Provider Notes (Signed)
MSE was initiated and I personally evaluated the patient and placed orders (if any) at  3:46 PM on April 09, 2014.  The patient appears stable so that the remainder of the MSE may be completed by another provider.  Mary Rubio is a 69 y.o. female with a history of GERD and osteoporosis who presents to the Emergency Department complaining of constant, sharp pain to the 9th rib on her left side that occurred 12 days ago after bending laterally to feed a stray cat under her car. She had xrays performed 9 days ago at urgent care and was told that she fractured her rib, although chart review reveals that she had an old fx without any acute fx. Bending and breathing out aggravates her pain. She rates her pain 5-6/10. She was given Tussinex for her symptoms and was told to take 76mL daily by a physician at urgent care. She did not experience any relief to her symptoms when taking the medication. She lists decreased appetite, leg pain, diarrhea that started 2 days ago, nausea, and lower abdominal pain that is worsened with bowel movements as associated symptoms. She describes her headaches as intermittent and different from headaches she normally gets. She denies fever, dysuria, leg swelling, neck stiffness, numbness or tingling in her legs, weakness, hematuria, back pain, chest pain, SOB, vomiting, and headache as associated symptoms. She does not have a history of cancer, recent travel, or immobilization. She has a history of 9 rib fractures due to whooping cough she experienced 16 years ago.   BP 147/83 mmHg  Pulse 117  Temp(Src) 98 F (36.7 C) (Oral)  Resp 21  SpO2 100%  On exam, she is tachycardic, and has some LUQ abd pain. Given her tachycardia and abd pain, pt upgraded to a 3 and moved to an acute bed for more extensive work up.      Tabor Bartram Camprubi-S[place], PA-C 04/09/14 [place], MD 04/09/14 2113

## 2014-04-09 NOTE — ED Notes (Signed)
Pt states that she has rib fracture to 9th rib on left side over week ago.  Pt states that she is in a lot of pain and having trouble sleeping bc of it.  Pt states that she is very narcotic sensitive and took Tussinex and was out for like 8 hours. 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 Pt states that she has been very nauseated. Pt wants to make sure that she hasnt injured anything else.

## 2014-04-13 ENCOUNTER — Other Ambulatory Visit: Payer: Self-pay | Admitting: Internal Medicine

## 2014-04-13 ENCOUNTER — Ambulatory Visit (INDEPENDENT_AMBULATORY_CARE_PROVIDER_SITE_OTHER): Payer: Medicare Other | Admitting: Internal Medicine

## 2014-04-13 VITALS — BP 124/72 | HR 107 | Temp 98.1°F | Resp 16 | Ht 64.0 in | Wt 132.0 lb

## 2014-04-13 DIAGNOSIS — E8809 Other disorders of plasma-protein metabolism, not elsewhere classified: Secondary | ICD-10-CM

## 2014-04-13 DIAGNOSIS — R7982 Elevated C-reactive protein (CRP): Secondary | ICD-10-CM | POA: Diagnosis not present

## 2014-04-13 DIAGNOSIS — R61 Generalized hyperhidrosis: Secondary | ICD-10-CM

## 2014-04-13 DIAGNOSIS — R197 Diarrhea, unspecified: Secondary | ICD-10-CM

## 2014-04-13 DIAGNOSIS — R1032 Left lower quadrant pain: Secondary | ICD-10-CM

## 2014-04-13 LAB — POCT CBC
Granulocyte percent: 63.4 %G (ref 37–80)
HCT, POC: 43.5 % (ref 37.7–47.9)
Hemoglobin: 13.7 g/dL (ref 12.2–16.2)
Lymph, poc: 2.2 (ref 0.6–3.4)
MCH, POC: 29.2 pg (ref 27–31.2)
MCHC: 31.5 g/dL — AB (ref 31.8–35.4)
MCV: 92.6 fL (ref 80–97)
MID (cbc): 0.3 (ref 0–0.9)
MPV: 7.4 fL (ref 0–99.8)
POC Granulocyte: 4.3 (ref 2–6.9)
POC LYMPH PERCENT: 32.2 %L (ref 10–50)
POC MID %: 4.4 %M (ref 0–12)
Platelet Count, POC: 259 10*3/uL (ref 142–424)
RBC: 4.7 M/uL (ref 4.04–5.48)
RDW, POC: 13.1 %
WBC: 6.8 10*3/uL (ref 4.6–10.2)

## 2014-04-13 LAB — POCT SEDIMENTATION RATE: POCT SED RATE: 36 mm/hr — AB (ref 0–22)

## 2014-04-13 MED ORDER — PB-HYOSCY-ATROPINE-SCOPOLAMINE 16.2 MG PO TABS: 1.0000 | ORAL_TABLET | Freq: Four times a day (QID) | ORAL | Status: DC

## 2014-04-13 NOTE — Progress Notes (Signed)
Subjective:  This chart was scribed for Mary Lin, MD by Mary Rubio, Medical scribe. This patient was seen in ROOM 10 and the patient's care was started 4:59 PM.  Patient ID: Mary Rubio, female    DOB: 02-Feb-1945, 69 y.o.   MRN: [id number]   Chief Complaint  Patient presents with  . Abdominal Pain    x 5 days  . Diarrhea    x 1 day   HPI HPI Comments: Mary Rubio is a 69 y.o. female with a history of asthma, hypothyroidism and GERD who presents to [facility] complaining of worsening abdominal pain for the last 2 weeks. Pt states that she started having watery diarrhea yesterday. She reports associated nausea and dry heaving. Pt reports she has been experiencing a decrease in her appetite as well. She states that she feels like she needs to have a BM but is not able to. She states that her stool has a strong odor which which is atypical. Pt reports that she sometimes experiences a a tingling sensation down her back and into her legs. Pt reports she is on zantac and synthroid at this time. She states she has not had Abx in the last 1 month. She denies fever, blood in her stool or hematuria.   Pt reports that she broke her rib 11 days ago when she bent down to feed a cat. She states that her pain has been improving. Pt reports that certain movements and positions worsens her pain.    GI eval Dr Collene Mares last year [facility]//[facility] 2010 [facility]  Patient Active Problem List   Diagnosis Date Noted  . Vaginal lesion 04/12/2013  . Dysphagia 05/26/2012  . Esophageal reflux 05/26/2012  . Diaphragmatic hernia 05/26/2012  . 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 Precordial pain 05/12/2012  . Hypothyroidism 08/14/2007  . HYPERLIPIDEMIA 08/14/2007  . ALLERGIC RHINITIS 08/14/2007  . ASTHMA 08/14/2007   Past Medical History  Diagnosis Date  . Environmental allergies     Weekly allergy shots  . Depression   . GERD (gastroesophageal reflux disease)   . Hypothyroidism   . Anxiety   .  Hyperlipidemia   . Hiatal hernia   . Asthma    Current Outpatient Prescriptions on File Prior to Visit  Medication Sig Dispense Refill  . ranitidine (ZANTAC) 150 MG tablet Take 1 tablet (150 mg total) by mouth 2 (two) times daily. 180 tablet 3  . SYNTHROID 100 MCG tablet Take 1 tablet (100 mcg total) by mouth daily before breakfast. 60 tablet 2  . albuterol (PROVENTIL HFA;VENTOLIN HFA) 108 (90 BASE) MCG/ACT inhaler Inhale 2 puffs into the lungs every 4 (four) hours as needed for wheezing or shortness of breath (cough, shortness of breath or wheezing.). (Patient not taking: Reported on 04/13/2014) 1 Inhaler 12  . calcium-vitamin D (OSCAL WITH D) 500-200 MG-UNIT per tablet Take 1 tablet by mouth daily. (Patient not taking: Reported on 04/13/2014) 90 tablet 3  . chlorpheniramine-HYDROcodone (TUSSIONEX PENNKINETIC ER) 10-8 MG/5ML LQCR Take 5 mLs by mouth every 12 (twelve) hours as needed. (Patient not taking: Reported on 04/13/2014) 100 mL 0  . FLUoxetine (PROZAC) 10 MG tablet Take 1 tablet (10 mg total) by mouth daily. (Patient not taking: Reported on 04/13/2014) 30 tablet 3  . ipratropium (ATROVENT) 0.03 % nasal spray Place 2 sprays into the nose 4 (four) times daily. (Patient not taking: Reported on 04/13/2014) 30 mL 6  . Polyethyl Glycol-Propyl Glycol (SYSTANE OP) Place 1 drop into both eyes daily.  No current facility-administered medications on file prior to visit.   Allergies  Allergen Reactions  . Augmentin [Amoxicillin-Pot Clavulanate] Other (See Comments)    Stomach pain   . Crestor [Rosuvastatin]     Myalgias  . Latex Other (See Comments)    Canker sores in mouth (discovered by dentist)  . Meperidine Hcl Other (See Comments)    Does not remember   . Morphine And Related Itching    Has tolerated small doses of hydrocodone  . Pentazocine Lactate Nausea And Vomiting    Sick on stomach  . Simvastatin Other (See Comments)    Muscle aches   . Codeine Rash    Has tolerated small doses  of hydrocodone  . Morphine Rash   Past Surgical History  Procedure Laterality Date  . Abdominal hysterectomy    . Cholecystectomy    . Bunionectomy    . Thumb surgery      for cyst  . Exploratory laparotomy      endometriosis  . Bartholin gland cyst excision    . Facial cyst      excision  . Esophagogastroduodenoscopy N/A 10/21/2012    Procedure: ESOPHAGOGASTRODUODENOSCOPY (EGD);  Surgeon: Theda Belfast, MD;  Location: [facility];  Service: Endoscopy;  Laterality: N/A;   Family History  Problem Relation Age of Onset  . Cancer Mother   . Hypertension Mother   . Cancer Father   . Dementia Father   . Heart disease Sister   . Thyroid disease Daughter   . Heart disease Maternal Grandfather   . Heart disease Paternal Grandmother   . Thyroid disease Paternal Grandfather   . Arrhythmia Father   . CAD Sister     MI/stent age 40, CABG age 79  . CAD      Several relatives on dad's side have had MIs/CABG   History   Social History  . Marital Status: Single    Spouse Name: N/A  . Number of Children: N/A  . Years of Education: college   Occupational History  . caregiver    Social History Main Topics  . Smoking status: Never Smoker   . Smokeless tobacco: Never Used  . Alcohol Use: No  . Drug Use: No  . Sexual Activity: No   Other Topics Concern  . Not on file   Social History Narrative   Review of Systems  Constitutional: Positive for appetite change. Negative for fever.  Gastrointestinal: Positive for nausea, abdominal pain and diarrhea. Negative for vomiting and blood in stool.  Genitourinary: Negative for hematuria.  All other systems reviewed and are negative.     Objective:   Physical Exam  Constitutional: She is oriented to person, place, and time. She appears well-developed and well-nourished. No distress.  HENT:  Head: Normocephalic and atraumatic.  Eyes: Conjunctivae and EOM are normal. Pupils are equal, round, and reactive to light.  Neck: Neck  supple.  Cardiovascular: Normal rate.   Pulmonary/Chest: Effort normal. No respiratory distress. She has no wheezes. She has no rales.  Abdominal: Soft. Bowel sounds are normal. There is tenderness. There is rebound.  TTP LLQ with mild rebound exageration. No CVA tenderness to percussion. Tender lower left rib margin. No organomegaly.   Neurological: She is alert and oriented to person, place, and time. No cranial nerve deficit.  Psychiatric: She has a normal mood and affect. Her behavior is normal.  Nursing note and vitals reviewed.  Filed Vitals:   04/13/14 1643  BP: 124/72  Pulse: 107  Temp: 98.1 F (36.7 C)  TempSrc: Oral  Resp: 16  Height: $Remove'5\' 4"'LCimlsV$  (1.626 m)  Weight: 132 lb (59.875 kg)  SpO2: 96%      Assessment & Plan:   I have completed the patient encounter in its entirety as documented by the scribe, with editing by me where necessary. Robert P. Laney Pastor, M.D.  LLQ abdominal pain - Plan: POCT CBC, POCT SEDIMENTATION RATE, Comprehensive metabolic panel, Stool culture, Clostridium difficile EIA, CT Abdomen Pelvis W Contrast  Hyperproteinemia - Plan: POCT SEDIMENTATION RATE  Diarrhea - Plan: Stool culture, Clostridium difficile EIA, CT Abdomen Pelvis W Contrast  Night sweats - Plan: CT Abdomen Pelvis W Contrast  Meds ordered this encounter  Medications  . belladona alk-PHENObarbital (DONNATAL) 16.2 MG tablet    Sig: Take 1 tablet by mouth 4 (four) times daily. Before meals and at hs    Dispense:  30 tablet    Refill:  0///////////// not available at the pharmacy and not on her approved medication list so this was changed to Bentyl 20 mg before meals and at bedtime for 1 week      Addendum 04/14/2014 CT scan results show no problems withcolon: And point out some inflammatory changes:"distal gastritis and proximal duodenitis" Will add Carafate to current treatment plan and follow-up 1 w She had endoscopy last year by Dr Collene Mares Is on ranitidine 150 bid  Labs: Results  for orders placed or performed in visit on 04/13/14  POCT CBC  Result Value Ref Range   WBC 6.8 4.6 - 10.2 K/uL   Lymph, poc 2.2 0.6 - 3.4   POC LYMPH PERCENT 32.2 10 - 50 %L   MID (cbc) 0.3 0 - 0.9   POC MID % 4.4 0 - 12 %M   POC Granulocyte 4.3 2 - 6.9   Granulocyte percent 63.4 37 - 80 %G   RBC 4.70 4.04 - 5.48 M/uL   Hemoglobin 13.7 12.2 - 16.2 g/dL   HCT, POC 43.5 37.7 - 47.9 %   MCV 92.6 80 - 97 fL   MCH, POC 29.2 27 - 31.2 pg   MCHC 31.5 (A) 31.8 - 35.4 g/dL   RDW, POC 13.1 %   Platelet Count, POC 259 142 - 424 K/uL   MPV 7.4 0 - 99.8 fL  POCT SEDIMENTATION RATE  Result Value Ref Range   POCT SED RATE 36 (A) 0 - 22 mm/hr

## 2014-04-14 ENCOUNTER — Ambulatory Visit (HOSPITAL_COMMUNITY)
Admission: RE | Admit: 2014-04-14 | Discharge: 2014-04-14 | Disposition: A | Discharge status: Home / Self Care | Payer: Medicare Other | Source: Ambulatory Visit | Attending: Internal Medicine | Admitting: Internal Medicine

## 2014-04-14 ENCOUNTER — Encounter (HOSPITAL_COMMUNITY): Payer: Self-pay

## 2014-04-14 DIAGNOSIS — R110 Nausea: Secondary | ICD-10-CM | POA: Insufficient documentation

## 2014-04-14 DIAGNOSIS — R61 Generalized hyperhidrosis: Secondary | ICD-10-CM | POA: Insufficient documentation

## 2014-04-14 DIAGNOSIS — R109 Unspecified abdominal pain: Secondary | ICD-10-CM | POA: Diagnosis not present

## 2014-04-14 DIAGNOSIS — R1032 Left lower quadrant pain: Secondary | ICD-10-CM | POA: Diagnosis not present

## 2014-04-14 DIAGNOSIS — Z9049 Acquired absence of other specified parts of digestive tract: Secondary | ICD-10-CM | POA: Diagnosis not present

## 2014-04-14 DIAGNOSIS — K439 Ventral hernia without obstruction or gangrene: Secondary | ICD-10-CM | POA: Diagnosis not present

## 2014-04-14 DIAGNOSIS — R197 Diarrhea, unspecified: Secondary | ICD-10-CM | POA: Insufficient documentation

## 2014-04-14 LAB — COMPREHENSIVE METABOLIC PANEL
ALT: 10 U/L (ref 0–35)
AST: 15 U/L (ref 0–37)
Albumin: 4.7 g/dL (ref 3.5–5.2)
Alkaline Phosphatase: 62 U/L (ref 39–117)
BUN: 10 mg/dL (ref 6–23)
CO2: 22 mEq/L (ref 19–32)
Calcium: 9.8 mg/dL (ref 8.4–10.5)
Chloride: 102 mEq/L (ref 96–112)
Creat: 0.91 mg/dL (ref 0.50–1.10)
Glucose, Bld: 83 mg/dL (ref 70–99)
Potassium: 4 mEq/L (ref 3.5–5.3)
Sodium: 140 mEq/L (ref 135–145)
Total Bilirubin: 0.3 mg/dL (ref 0.2–1.2)
Total Protein: 7.9 g/dL (ref 6.0–8.3)

## 2014-04-14 MED ORDER — SUCRALFATE 1 GM/10ML PO SUSP: 1.0000 g | Freq: Three times a day (TID) | ORAL | Status: DC

## 2014-04-14 MED ORDER — IOHEXOL 300 MG/ML  SOLN
100.0000 mL | Freq: Once | INTRAMUSCULAR | Status: AC | PRN
  Administered 2014-04-14: 100 mL via INTRAVENOUS

## 2014-04-14 MED ORDER — IOHEXOL 300 MG/ML  SOLN
50.0000 mL | Freq: Once | INTRAMUSCULAR | Status: AC | PRN
  Administered 2014-04-14: 50 mL via ORAL

## 2014-04-15 DIAGNOSIS — R197 Diarrhea, unspecified: Secondary | ICD-10-CM | POA: Diagnosis not present

## 2014-04-15 DIAGNOSIS — R1032 Left lower quadrant pain: Secondary | ICD-10-CM | POA: Diagnosis not present

## 2014-04-16 ENCOUNTER — Other Ambulatory Visit: Payer: Self-pay | Admitting: Internal Medicine

## 2014-04-16 DIAGNOSIS — R1032 Left lower quadrant pain: Secondary | ICD-10-CM | POA: Diagnosis not present

## 2014-04-16 DIAGNOSIS — R197 Diarrhea, unspecified: Secondary | ICD-10-CM | POA: Diagnosis not present

## 2014-04-17 ENCOUNTER — Telehealth: Payer: Self-pay | Admitting: Internal Medicine

## 2014-04-17 LAB — C. DIFFICILE GDH AND TOXIN A/B
C. difficile GDH: NOT DETECTED
C. difficile Toxin A/B: NOT DETECTED

## 2014-04-17 NOTE — Telephone Encounter (Signed)
Patient is requesting a call back about her lab results. She also wants to go over her sed rate and wants to possibly start a depometral shot. Please call patient at [phone]

## 2014-04-18 ENCOUNTER — Other Ambulatory Visit: Payer: Self-pay | Admitting: Radiology

## 2014-04-18 LAB — STOOL CULTURE

## 2014-04-18 LAB — PROTEIN ELECTROPHORESIS, SERUM
Albumin ELP: 4.7 g/dL (ref 3.8–4.8)
Alpha-1-Globulin: 0.4 g/dL — ABNORMAL HIGH (ref 0.2–0.3)
Alpha-2-Globulin: 0.8 g/dL (ref 0.5–0.9)
Beta 2: 0.5 g/dL (ref 0.2–0.5)
Beta Globulin: 0.5 g/dL (ref 0.4–0.6)
Gamma Globulin: 1.3 g/dL (ref 0.8–1.7)
Total Protein, Serum Electrophoresis: 8.1 g/dL (ref 6.1–8.1)

## 2014-04-18 MED ORDER — METRONIDAZOLE 500 MG PO TABS: 500.0000 mg | ORAL_TABLET | Freq: Two times a day (BID) | ORAL | Status: DC

## 2014-04-18 NOTE — Telephone Encounter (Signed)
Stool tests negative Best to strart rx for giardia instead of testing Call in flagyl 500 bid for 10 days

## 2014-04-18 NOTE — Telephone Encounter (Signed)
Gave pt results. Will call in flagyl.

## 2014-04-18 NOTE — Telephone Encounter (Signed)
lmom to cb. 

## 2014-04-18 NOTE — Telephone Encounter (Signed)
Spoke with pt--informed her that her stool studies are nl. She is still having painful diarrhea and she had Giardia back in the 70's. She says it kind of feels like that.  She is wondering if she needs to be tested for that.

## 2014-04-25 ENCOUNTER — Telehealth: Payer: Self-pay

## 2014-04-25 NOTE — Telephone Encounter (Signed)
Spoke with pt, she would like Dr. Laney Pastor to review her serum electrophoresis lab. She states the number is 8.1 and is very concerned about it being in the high normal range. Please advise.

## 2014-04-25 NOTE — Telephone Encounter (Signed)
i had the lab separate the parts of the specimen and the slight elevation was a nonspecific protein that probably came from immune system responding to her diarrhea. We'll recheck this in 3-6 months to be sure

## 2014-04-26 NOTE — Telephone Encounter (Signed)
Gave pt. results

## 2014-05-15 ENCOUNTER — Ambulatory Visit (INDEPENDENT_AMBULATORY_CARE_PROVIDER_SITE_OTHER): Payer: Medicare Other | Admitting: Internal Medicine

## 2014-05-15 ENCOUNTER — Ambulatory Visit (INDEPENDENT_AMBULATORY_CARE_PROVIDER_SITE_OTHER): Payer: Medicare Other

## 2014-05-15 VITALS — BP 104/60 | HR 90 | Temp 97.9°F | Resp 12 | Ht 64.0 in | Wt 129.4 lb

## 2014-05-15 DIAGNOSIS — R5383 Other fatigue: Secondary | ICD-10-CM

## 2014-05-15 DIAGNOSIS — K921 Melena: Secondary | ICD-10-CM

## 2014-05-15 DIAGNOSIS — R1031 Right lower quadrant pain: Secondary | ICD-10-CM

## 2014-05-15 LAB — POCT CBC
Granulocyte percent: 67.6 %G (ref 37–80)
HCT, POC: 40.8 % (ref 37.7–47.9)
Hemoglobin: 14 g/dL (ref 12.2–16.2)
Lymph, poc: 2.1 (ref 0.6–3.4)
MCH, POC: 31 pg (ref 27–31.2)
MCHC: 34.4 g/dL (ref 31.8–35.4)
MCV: 90.1 fL (ref 80–97)
MID (cbc): 0.1 (ref 0–0.9)
MPV: 7.2 fL (ref 0–99.8)
POC Granulocyte: 4.7 (ref 2–6.9)
POC LYMPH PERCENT: 30.6 %L (ref 10–50)
POC MID %: 1.8 %M (ref 0–12)
Platelet Count, POC: 256 10*3/uL (ref 142–424)
RBC: 4.52 M/uL (ref 4.04–5.48)
RDW, POC: 13.1 %
WBC: 6.9 10*3/uL (ref 4.6–10.2)

## 2014-05-15 LAB — POCT SEDIMENTATION RATE: POCT SED RATE: 33 mm/hr — AB (ref 0–22)

## 2014-05-15 LAB — IFOBT (OCCULT BLOOD): IFOBT: POSITIVE

## 2014-05-15 MED ORDER — DICYCLOMINE HCL 20 MG PO TABS: 20.0000 mg | ORAL_TABLET | Freq: Three times a day (TID) | ORAL | Status: DC

## 2014-05-15 NOTE — Progress Notes (Signed)
Subjective:    Patient ID: Mary Rubio, female    DOB: [birth year], 69 y.o.   MRN: [id number]  This chart was scribed for Mary Koyanagi, MD by Stephania Fragmin, ED Scribe. This patient was seen in room 2 and the patient's care was started at 5:57 PM.   HPI  Chief Complaint  Patient presents with  . Rectal Bleeding  . Abdominal Pain    Lower Right Quadrant     HPI Comments: Mary Rubio is a 69 y.o. female who presents to the [facility] complaining of gradually improving, rectal bleeding that occurred 3 days ago. She suddenly had "explosive" diarrhea that appeared bright red and mucous-y. She again had some blood the next day, but had no bleeding yesterday. She also complains of associated nausea when passing stools. Patient typically has "loose stools," but she had no issue like this until this episode. She is especially confused because recently changed her diet to home-cooked chicken and vegetables, and has stopped eating out. She does note that recently ate strawberries which made her feel funny.  She was seen 1 month ago for LLQ tenderness, as well as appetite loss, that have both since improved. Her sed rate at that visit was 32, and she reportedly lost 5 pounds during that time. Patient however now complains of intermittent, sharp, stabbing, right sided abdominal pain. She has been waking up in the night with fever and night sweats, and has also been having fatigue. Going to the bathroom alleviates the pressure in her abdomen but not the pain. She had a colonoscopy last done in 2010 that was normal.  She denies any urinary symptoms . CT scan  Patient also complains of severe left hip pain and a pruritic rash on her hip. She applied Abeeno lotion on it today.   Patient recently fractured her rib when bending over.  Patient is distressed by her frequent health problems. She has been stressed by taking care of her father, who no longer remembers who she  is.   Patient Active Problem List   Diagnosis Date Noted  . Dysphagia 05/26/2012  . Esophageal reflux 05/26/2012  . Diaphragmatic hernia 05/26/2012  . 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 Precordial pain 05/12/2012  . Hypothyroidism----Dr Cruzita Lederer 08/14/2007  . HYPERLIPIDEMIA 08/14/2007  . ALLERGIC RHINITIS 08/14/2007  . ASTHMA 08/14/2007   Her father for whom she is the caretaker has had advancing Alzheimer's--lots of stress Past Medical History  Diagnosis Date  . Environmental allergies     Weekly allergy shots  . Depression   . GERD (gastroesophageal reflux disease)   . Hypothyroidism   . Anxiety   . Hyperlipidemia   . Hiatal hernia   . Asthma     Prior to Admission medications   Medication Sig Start Date End Date Taking? Authorizing Provider  albuterol (PROVENTIL HFA;VENTOLIN HFA) 108 (90 BASE) MCG/ACT inhaler Inhale 2 puffs into the lungs every 4 (four) hours as needed for wheezing or shortness of breath (cough, shortness of breath or wheezing.). 02/06/14  Yes Roselee Culver, MD  Polyethyl Glycol-Propyl Glycol (SYSTANE OP) Place 1 drop into both eyes daily.   Yes Historical Provider, MD  ranitidine (ZANTAC) 150 MG tablet Take 1 tablet (150 mg total) by mouth 2 (two) times daily. 09/24/12  Yes Orma Flaming, MD  sucralfate (CARAFATE) 1 GM/10ML suspension Take 10 mLs (1 g total) by mouth 4 (four) times daily -  with meals and at bedtime. 04/14/14  Yes Herbie Baltimore  Burtis Junes, MD  SYNTHROID 100 MCG tablet Take 1 tablet (100 mcg total) by mouth daily before breakfast. 01/23/14  Yes Philemon Kingdom, MD   Allergies  Allergen Reactions  . Augmentin [Amoxicillin-Pot Clavulanate] Other (See Comments)    Stomach pain   . Crestor [Rosuvastatin]     Myalgias  . Latex Other (See Comments)    Canker sores in mouth (discovered by dentist)  . Meperidine Hcl Other (See Comments)    Does not remember   . Morphine And Related Itching    Has tolerated small doses of hydrocodone  . Pentazocine  Lactate Nausea And Vomiting    Sick on stomach  . Simvastatin Other (See Comments)    Muscle aches   . Codeine Rash    Has tolerated small doses of hydrocodone  . Morphine Rash      Review of Systems  Constitutional: Positive for fever, diaphoresis and fatigue. Negative for activity change, appetite change and unexpected weight change.  HENT: Negative for trouble swallowing.   Respiratory: Negative for shortness of breath.   Cardiovascular: Negative for chest pain and palpitations.  Genitourinary: Negative for dysuria, urgency, frequency, hematuria, decreased urine volume, enuresis and difficulty urinating.  Skin: Positive for rash.  Neurological: Negative for headaches.       Objective:   Physical Exam  Constitutional: She is oriented to person, place, and time. She appears well-developed and well-nourished. No distress.  Eyes: Conjunctivae and EOM are normal. Pupils are equal, round, and reactive to light.  Neck: Neck supple. No thyromegaly present.  Cardiovascular: Normal rate, regular rhythm and normal heart sounds.   No murmur heard. Pulmonary/Chest: Effort normal and breath sounds normal.  Abdominal: Bowel sounds are normal. There is tenderness. There is no rebound.  Marked tenderness in the RLQ and right middle quadrant, without rebound. No organomegaly.   Genitourinary:  Rectal exam reveals no masses, no external hemorrhoids, no anal fissure. Stool is heme positive although it is brown.  Musculoskeletal: She exhibits no edema.  Lymphadenopathy:    She has no cervical adenopathy.  Neurological: She is alert and oriented to person, place, and time. No cranial nerve deficit.  Skin:  There are several small red papules in the left inguinal crease without vesiculation or petechiae.  Psychiatric: She has a normal mood and affect.  Nursing note and vitals reviewed. BP 104/60 mmHg  Pulse 90  Temp(Src) 97.9 F (36.6 C) (Oral)  Resp 12  Ht 5\' 4"  (1.626 m)  Wt 129 lb 6 oz  (58.684 kg)  BMI 22.20 kg/m2  SpO2 97%  [facility] reading (PRIMARY) by  Dr. Laney Pastor. No signs of obstruction. Lots of stool in the right lower quadrant.  Results for orders placed or performed in visit on 05/15/14  POCT CBC  Result Value Ref Range   WBC 6.9 4.6 - 10.2 K/uL   Lymph, poc 2.1 0.6 - 3.4   POC LYMPH PERCENT 30.6 10 - 50 %L   MID (cbc) 0.1 0 - 0.9   POC MID % 1.8 0 - 12 %M   POC Granulocyte 4.7 2 - 6.9   Granulocyte percent 67.6 37 - 80 %G   RBC 4.52 4.04 - 5.48 M/uL   Hemoglobin 14.0 12.2 - 16.2 g/dL   HCT, POC 40.8 37.7 - 47.9 %   MCV 90.1 80 - 97 fL   MCH, POC 31.0 27 - 31.2 pg   MCHC 34.4 31.8 - 35.4 g/dL   RDW, POC 13.1 %   Platelet  Count, POC 256 142 - 424 K/uL   MPV 7.2 0 - 99.8 fL  POCT SEDIMENTATION RATE  Result Value Ref Range   POCT SED RATE 33 (A) 0 - 22 mm/hr  IFOBT POC (occult bld, rslt in office)  Result Value Ref Range   IFOBT Positive        Assessment & Plan:  Hematochezia - Plan: POCT CBC, POCT SEDIMENTATION RATE, IFOBT POC (occult bld, rslt in office), Clostridium Difficile by PCR, Ambulatory referral to Gastroenterology  Abdominal pain, RLQ - Plan: DG Abd 1 View--- this suggests constipation as an etiology despite her intermittent diarrhea--- with her hematochezia colonoscopy is indicated to rule out a mass lesion even though the CT was negative  Other fatigue - Plan: Comprehensive metabolic panel   Meds ordered this encounter  Medications  . dicyclomine (BENTYL) 20 MG tablet    Sig: Take 1 tablet (20 mg total) by mouth 4 (four) times daily -  before meals and at bedtime.    Dispense:  30 tablet    Refill:  1     I have completed the patient encounter in its entirety as documented by the scribe, with editing by me where necessary. Faithann Natal P. Laney Pastor, M.D.

## 2014-05-16 ENCOUNTER — Telehealth: Payer: Self-pay | Admitting: Gastroenterology

## 2014-05-16 LAB — COMPREHENSIVE METABOLIC PANEL
ALT: 12 U/L (ref 0–35)
AST: 14 U/L (ref 0–37)
Albumin: 4.5 g/dL (ref 3.5–5.2)
Alkaline Phosphatase: 53 U/L (ref 39–117)
BUN: 16 mg/dL (ref 6–23)
CO2: 24 mEq/L (ref 19–32)
Calcium: 9.3 mg/dL (ref 8.4–10.5)
Chloride: 102 mEq/L (ref 96–112)
Creat: 0.98 mg/dL (ref 0.50–1.10)
Glucose, Bld: 88 mg/dL (ref 70–99)
Potassium: 4.4 mEq/L (ref 3.5–5.3)
Sodium: 142 mEq/L (ref 135–145)
Total Bilirubin: 0.3 mg/dL (ref 0.2–1.2)
Total Protein: 7.2 g/dL (ref 6.0–8.3)

## 2014-05-16 NOTE — Telephone Encounter (Signed)
Caller name: Mateo Flow Call back [phone]  Reason for call:  Referral in Epic.  Pt was seen at Urgent Care for her persistent abd pain.  She is now having bleeding.  After offering first available with MD, she wanted sooner, so I offered first available with extender, she still wants sooner.  She states she has lost 5 pounds as well.  Patient would like to be seen asap.

## 2014-05-17 NOTE — Telephone Encounter (Signed)
The pt has abd pain and rectal bleeding she was seen at urgent care and was referred to GI.  She has a history with Dr Collene Mares from 2010 and she will have those records faxed prior to appt that has been scheduled for 05/21/14 with Amy.Marland Kitchen

## 2014-05-18 LAB — CLOSTRIDIUM DIFFICILE BY PCR: Toxigenic C. Difficile by PCR: NOT DETECTED

## 2014-05-21 ENCOUNTER — Ambulatory Visit (INDEPENDENT_AMBULATORY_CARE_PROVIDER_SITE_OTHER): Payer: Medicare Other | Admitting: Physician Assistant

## 2014-05-21 ENCOUNTER — Other Ambulatory Visit (INDEPENDENT_AMBULATORY_CARE_PROVIDER_SITE_OTHER): Payer: Medicare Other

## 2014-05-21 ENCOUNTER — Encounter: Payer: Self-pay | Admitting: Physician Assistant

## 2014-05-21 VITALS — BP 108/70 | HR 88 | Ht 62.5 in | Wt 129.0 lb

## 2014-05-21 DIAGNOSIS — R938 Abnormal findings on diagnostic imaging of other specified body structures: Secondary | ICD-10-CM

## 2014-05-21 DIAGNOSIS — R1031 Right lower quadrant pain: Secondary | ICD-10-CM | POA: Diagnosis not present

## 2014-05-21 DIAGNOSIS — R634 Abnormal weight loss: Secondary | ICD-10-CM

## 2014-05-21 DIAGNOSIS — K625 Hemorrhage of anus and rectum: Secondary | ICD-10-CM

## 2014-05-21 DIAGNOSIS — R9389 Abnormal findings on diagnostic imaging of other specified body structures: Secondary | ICD-10-CM

## 2014-05-21 LAB — CBC WITH DIFFERENTIAL/PLATELET
Basophils Absolute: 0 10*3/uL (ref 0.0–0.1)
Basophils Relative: 0.5 % (ref 0.0–3.0)
Eosinophils Absolute: 0.1 10*3/uL (ref 0.0–0.7)
Eosinophils Relative: 2.3 % (ref 0.0–5.0)
HCT: 43.4 % (ref 36.0–46.0)
Hemoglobin: 14.6 g/dL (ref 12.0–15.0)
Lymphocytes Relative: 28.6 % (ref 12.0–46.0)
Lymphs Abs: 1.8 10*3/uL (ref 0.7–4.0)
MCHC: 33.5 g/dL (ref 30.0–36.0)
MCV: 91.6 fl (ref 78.0–100.0)
Monocytes Absolute: 0.4 10*3/uL (ref 0.1–1.0)
Monocytes Relative: 6.4 % (ref 3.0–12.0)
Neutro Abs: 3.8 10*3/uL (ref 1.4–7.7)
Neutrophils Relative %: 62.2 % (ref 43.0–77.0)
Platelets: 211 10*3/uL (ref 150.0–400.0)
RBC: 4.74 Mil/uL (ref 3.87–5.11)
RDW: 12.9 % (ref 11.5–15.5)
WBC: 6.2 10*3/uL (ref 4.0–10.5)

## 2014-05-21 LAB — H. PYLORI ANTIBODY, IGG: H Pylori IgG: NEGATIVE

## 2014-05-21 LAB — IGA: IgA: 212 mg/dL (ref 68–378)

## 2014-05-21 MED ORDER — RANITIDINE HCL 300 MG PO TABS: 300.0000 mg | ORAL_TABLET | Freq: Two times a day (BID) | ORAL | Status: DC

## 2014-05-21 MED ORDER — DICYCLOMINE HCL 20 MG PO TABS: 20.0000 mg | ORAL_TABLET | Freq: Three times a day (TID) | ORAL | Status: DC

## 2014-05-21 NOTE — Patient Instructions (Signed)
You have been scheduled for an endoscopy and colonoscopy. Please follow the written instructions given to you at your visit today. Please pick up your prep supplies at the pharmacy within the next 1-3 days. [employer] .  If you use inhalers (even only as needed), please bring them with you on the day of your procedure. Your physician has requested that you go to www.startemmi.com and enter the access code given to you at your visit today. This web site gives a general overview about your procedure. However, you should still follow specific instructions given to you by our office regarding your preparation for the procedure.  We sent refills of Bentyl to [facility]. We sent refills of zantac.

## 2014-05-21 NOTE — Progress Notes (Signed)
Patient ID: Mary Rubio, female   DOB: Feb 17, 1945, 69 y.o.   MRN: [id number]   Subjective:    Patient ID: Mary Rubio, female    DOB: 1945-12-16, 69 y.o.   MRN: [id number]  HPI [name] is a pleasant 69 year old white female new to GI today. She says she had been a patient of Dr. Marga Hoots then Dr. Sharlett Iles many years ago but more recently had been seen by Dr. Collene Mares. She had undergone an EGD in October 2014 with finding of a 5 cm hiatal hernia no stricture. She also had colonoscopy done she believes in 2010, we do not have that report available for review. Patient is status post remote cholecystectomy, has history of hypothyroidism, asthma, anxiety/depression. She comes in today stating she's been having ongoing problems with some abdominal pain since 2009. She says she had been given a trial of Nexium but was unable to afford that. She has tried over-the-counter Prilosec which was helpful for while and then stopped working. She's been using ranitidine over the past year or so and feels that that works better she takes 150 mg twice daily. Currently has had 2-3 week history of epigastric pain and burning. About a week ago she had an episode of explosive diarrhea with bright red blood and mucus associated with lower abdominal cramping which is been present off and on since. She is been hurting in her right lower quadrant area she was seen in urgent care underwent CT scan of the abdomen and pelvis on 04/14/2014 found to have a fold thickening and inflammatory changes of the pylorus and duodenum and otherwise negative study with exception of a minimal ventral hernia containing only fat. He was given a trial of Bentyl which she has not taken sliding No regular aspirin or NSAIDs. She says she's been eating very bland trying to avoid postprandial pain. Her weight is also down about 10 pounds. She still some of this is because she is eating less and some because she has altered her diet so  drastically.  Review of Systems Pertinent positive and negative review of systems were noted in the above HPI section.  All other review of systems was otherwise negative.  Outpatient Encounter Prescriptions as of 05/21/2014  . Order #: [id number] Class: Normal  . Order #: [id number] Class: Historical Med  . Order #: [id number] Class: Normal  . [DISCONTINUED] Order #: [id number] Class: Normal  . Order #: [id number] Class: Normal  . Order #: [id number] Class: Normal  . [DISCONTINUED] Order #: [id number] Class: Normal  . [DISCONTINUED] Order #: [id number] Class: Normal   Allergies  Allergen Reactions  . Augmentin [Amoxicillin-Pot Clavulanate] Other (See Comments)    Stomach pain   . Crestor [Rosuvastatin]     Myalgias  . Demerol [Meperidine]   . Latex Other (See Comments)    Canker sores in mouth (discovered by dentist)  . Meperidine Hcl Other (See Comments)    Does not remember   . Morphine And Related Itching    Has tolerated small doses of hydrocodone  . Pentazocine Lactate Nausea And Vomiting    Sick on stomach  . Simvastatin Other (See Comments)    Muscle aches   . Talwin [Pentazocine]   . Codeine Rash    Has tolerated small doses of hydrocodone  . Morphine Rash   Patient Active Problem List   Diagnosis Date Noted  . Vaginal lesion 04/12/2013  . Dysphagia 05/26/2012  . Esophageal reflux 05/26/2012  . Diaphragmatic hernia 05/26/2012  . Bergmann's  syndrome 05/26/2012  . Precordial pain 05/12/2012  . Hypothyroidism 08/14/2007  . HYPERLIPIDEMIA 08/14/2007  . ALLERGIC RHINITIS 08/14/2007  . ASTHMA 08/14/2007   History   Social History  . Marital Status: Single    Spouse Name: N/A  . Number of Children: 1  . Years of Education: college   Occupational History  . caregiver    Social History Main Topics  . Smoking status: Never Smoker   . Smokeless tobacco: Never Used  . Alcohol Use: No  . Drug Use: No  . Sexual Activity: No   Other Topics Concern  . Not on file    Social History Narrative    Ms. Fitz-Gerald's family history includes Arrhythmia in her father; CAD in her sister; Dementia in her father; Heart disease in her father, maternal grandfather, mother, paternal grandmother, and sister; Hypertension in her mother; Lung cancer in her mother; Skin cancer in her father; Thyroid disease in her daughter, mother, and sister.      Objective:    Filed Vitals:   05/21/14 1441  BP: 108/70  Pulse: 88    Physical Exam  well-developed older white female in no acute distress, pleasant blood pressure 108/70 pulse 88 height 5 foot 2 weight 129. HEENT; nontraumatic normocephalic EOMI PERRLA sclera anicteric, Supple ;no JVD, Cardiovascular; regular rate and rhythm with S1-S2 no murmur or gallop, Pulmonary; clear bilaterally, Abdomen; soft she is tender in the epigastrium and hypogastrium and there is no guarding or rebound no palpable mass or hepatosplenomegaly she is also tender in the right lower quadrant, Rectal ;exam not done she just had this done last week by PCP and was Hemoccult-positive, Ext;is no clubbing cyanosis or edema skin warm and dry, Psych ;mood and affect appropriate       Assessment & Plan:  #1 69 yo female with one month hx of epigastric and hypogastric pain, worse post prandially ,weight loss and abnormal CT scan consistent with duodenitis. R/O hpylori, vs other inflammtory process  #2 known large hiatal hernia #3 RLQ pain, and episode of hematochezia S/p cholecystectomy #5hx colon polyps- type not known #6 hyperproteinemia  Plan; Increase Zantac to 300 mg po BID  Schedule for EGD and Colonoscopy with Dr. Henrene Pastor . Procedures discussed in detail with pt and she is agreeable to proceed Pt encouraged to take bentyl 10 mg TID ac Pt has signed release -will obtain colonoscopy records from Dr. Collene Mares. Check CMET, TTG/IGA, hpylori Ab   Bibi Economos S Jakoby Melendrez PA-C 05/21/2014   Cc: [name], MD

## 2014-05-22 LAB — TISSUE TRANSGLUTAMINASE, IGG: Tissue Transglut Ab: 1 U/mL (ref <6)

## 2014-05-22 NOTE — Progress Notes (Signed)
Agree with initial assessment and plans as outlined 

## 2014-05-23 ENCOUNTER — Other Ambulatory Visit (INDEPENDENT_AMBULATORY_CARE_PROVIDER_SITE_OTHER): Payer: Medicare Other

## 2014-05-23 ENCOUNTER — Other Ambulatory Visit: Payer: Self-pay

## 2014-05-23 ENCOUNTER — Telehealth: Payer: Self-pay

## 2014-05-23 DIAGNOSIS — R634 Abnormal weight loss: Secondary | ICD-10-CM

## 2014-05-23 LAB — COMPREHENSIVE METABOLIC PANEL
ALT: 13 U/L (ref 0–35)
AST: 18 U/L (ref 0–37)
Albumin: 4.7 g/dL (ref 3.5–5.2)
Alkaline Phosphatase: 62 U/L (ref 39–117)
BUN: 8 mg/dL (ref 6–23)
CO2: 24 mEq/L (ref 19–32)
Calcium: 10.5 mg/dL (ref 8.4–10.5)
Chloride: 106 mEq/L (ref 96–112)
Creatinine, Ser: 1.01 mg/dL (ref 0.40–1.20)
GFR: 58.21 mL/min — ABNORMAL LOW (ref >60.00)
Glucose, Bld: 91 mg/dL (ref 70–99)
Potassium: 5.1 mEq/L (ref 3.5–5.1)
Sodium: 143 mEq/L (ref 135–145)
Total Bilirubin: 0.2 mg/dL (ref 0.2–1.2)
Total Protein: 7.9 g/dL (ref 6.0–8.3)

## 2014-05-23 NOTE — Telephone Encounter (Signed)
Pt calling about lab results. Need review.

## 2014-05-23 NOTE — Telephone Encounter (Signed)
All ok --i sent thru mychart---cdiff neg as well!

## 2014-05-23 NOTE — Telephone Encounter (Signed)
Pt called wanting lab results. Please give a CB at [phone]

## 2014-06-01 ENCOUNTER — Telehealth: Payer: Self-pay | Admitting: Physician Assistant

## 2014-06-01 MED ORDER — MOVIPREP 100 G PO SOLR: 1.0000 | ORAL | Status: DC

## 2014-06-01 NOTE — Telephone Encounter (Signed)
Called patient to advise the Moviprep was sent to [facility] today 06-01-14.  I apologized for the inconvenience.

## 2014-06-11 ENCOUNTER — Ambulatory Visit (INDEPENDENT_AMBULATORY_CARE_PROVIDER_SITE_OTHER): Payer: Medicare Other | Admitting: Family Medicine

## 2014-06-11 VITALS — BP 130/80 | HR 84 | Temp 99.0°F | Resp 16 | Ht 64.0 in | Wt 127.0 lb

## 2014-06-11 DIAGNOSIS — N898 Other specified noninflammatory disorders of vagina: Secondary | ICD-10-CM | POA: Diagnosis not present

## 2014-06-11 DIAGNOSIS — F329 Major depressive disorder, single episode, unspecified: Secondary | ICD-10-CM

## 2014-06-11 DIAGNOSIS — G894 Chronic pain syndrome: Secondary | ICD-10-CM | POA: Diagnosis not present

## 2014-06-11 DIAGNOSIS — F331 Major depressive disorder, recurrent, moderate: Secondary | ICD-10-CM

## 2014-06-11 DIAGNOSIS — F32A Depression, unspecified: Secondary | ICD-10-CM | POA: Insufficient documentation

## 2014-06-11 MED ORDER — ESTRADIOL 0.1 MG/GM VA CREA: TOPICAL_CREAM | VAGINAL | Status: DC

## 2014-06-11 MED ORDER — DESVENLAFAXINE SUCCINATE ER 50 MG PO TB24: 50.0000 mg | ORAL_TABLET | Freq: Every day | ORAL | Status: DC

## 2014-06-11 NOTE — Progress Notes (Signed)
[facility] [address], [place] [address] [phone]  Date:  06/11/2014   Name:  Mary Rubio   DOB:  1945-03-17   MRN:  [id number]  PCP:  [name], MD    Chief Complaint: Pain   History of Present Illness:  [name] is a 69 y.o. very pleasant female patient who presents with the following:  Here today with a few concerns; "its a long story."  She is supposed to have an upper GI and colonosocpy next week per Dr. Henrene Pastor with [facility].  There is some concern that she may have IBD She states that "a few months ago I bend over and broke my rib," had a CTthjat showed inflammation in her small bowel.  It appears that there was actually no acute fracture on her plain films.  She states that "my shins hurt, my bones feel like they are breaking, I'm exhausted, it hurts under my arms.'  She notes trouble sleeping.   She also notes a "vaginal lesion" that has been present for an uncertain amount of time. She notes pain in the area and burning.  She tried to looks and "saw something that didn't look right."  She does not have dysuria or other sx with urination.   She also noes "shooting pains down my legs that feel like tons of needles or bites or bee stings.  It just travels down the legs constantly."    She has been having this pain all over her body "since last year some time."  She cares for her father who has dementia.  He still lives at home- next door to her condo.  She has some support from her family and is active in church life/   Patient Active Problem List   Diagnosis Date Noted  . Vaginal lesion 04/12/2013  . Dysphagia 05/26/2012  . Esophageal reflux 05/26/2012  . Diaphragmatic hernia 05/26/2012  . 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 Precordial pain 05/12/2012  . Hypothyroidism 08/14/2007  . HYPERLIPIDEMIA 08/14/2007  . ALLERGIC RHINITIS 08/14/2007  . ASTHMA 08/14/2007    Past Medical History  Diagnosis Date  .  Environmental allergies     Weekly allergy shots  . Depression   . GERD (gastroesophageal reflux disease)   . Hypothyroidism   . Anxiety   . Hyperlipidemia   . Hiatal hernia   . Asthma   . IBS (irritable bowel syndrome)   . Diverticulosis   . Colon polyps   . Osteoporosis     Past Surgical History  Procedure Laterality Date  . Abdominal hysterectomy    . Cholecystectomy    . Bunionectomy Right   . Thumb surgery Left     for cyst  . Exploratory laparotomy      endometriosis  . Bartholin gland cyst excision    . Facial cyst      excision  . Esophagogastroduodenoscopy N/A 10/21/2012    Procedure: ESOPHAGOGASTRODUODENOSCOPY (EGD);  Surgeon: Beryle Beams, MD;  Location: [facility];  Service: Endoscopy;  Laterality: N/A;  . Nasal septum surgery      History  Substance Use Topics  . Smoking status: Never Smoker   . Smokeless tobacco: Never Used  . Alcohol Use: No    Family History  Problem Relation Age of Onset  . Lung cancer Mother   . Hypertension Mother   . Skin cancer Father   . Dementia Father   . Heart disease Sister   . Thyroid disease  Daughter   . Heart disease Maternal Grandfather   . Heart disease Paternal Grandmother   . Thyroid disease Mother   . Arrhythmia Father   . CAD Sister     MI/stent age 88, CABG age 80  . Heart disease Father     Several relatives on dad's side have had MIs/CABG  . Heart disease Mother   . Thyroid disease Sister     Allergies  Allergen Reactions  . Augmentin [Amoxicillin-Pot Clavulanate] Other (See Comments)    Stomach pain   . Crestor [Rosuvastatin]     Myalgias  . Demerol [Meperidine]   . Latex Other (See Comments)    Canker sores in mouth (discovered by dentist)  . Meperidine Hcl Other (See Comments)    Does not remember   . Morphine And Related Itching    Has tolerated small doses of hydrocodone  . Pentazocine Lactate Nausea And Vomiting    Sick on stomach  . Simvastatin Other (See Comments)    Muscle  aches   . Talwin [Pentazocine]   . Codeine Rash    Has tolerated small doses of hydrocodone  . Morphine Rash    Medication list has been reviewed and updated.  Current Outpatient Prescriptions on File Prior to Visit  Medication Sig Dispense Refill  . albuterol (PROVENTIL HFA;VENTOLIN HFA) 108 (90 BASE) MCG/ACT inhaler Inhale 2 puffs into the lungs every 4 (four) hours as needed for wheezing or shortness of breath (cough, shortness of breath or wheezing.). 1 Inhaler 12  . dicyclomine (BENTYL) 20 MG tablet Take 1 tablet (20 mg total) by mouth 3 (three) times daily before meals. 90 tablet 3  . MOVIPREP 100 G SOLR Take 1 kit (200 g total) by mouth as directed. 1 kit 0  . Polyethyl Glycol-Propyl Glycol (SYSTANE OP) Place 1 drop into both eyes daily.    . ranitidine (ZANTAC) 300 MG tablet Take 1 tablet (300 mg total) by mouth 2 (two) times daily. 60 tablet 6  . SYNTHROID 100 MCG tablet Take 1 tablet (100 mcg total) by mouth daily before breakfast. 60 tablet 2   No current facility-administered medications on file prior to visit.    Review of Systems:  As per HPI- otherwise negative.   Physical Examination: Filed Vitals:   06/11/14 1536  BP: 130/80  Pulse: 106  Temp: 99 F (37.2 C)  Resp: 16   Filed Vitals:   06/11/14 1536  Height: $Remove'5\' 4"'aerLkoj$  (1.626 m)  Weight: 127 lb (57.607 kg)   Body mass index is 21.79 kg/(m^2). Ideal Body Weight: Weight in (lb) to have BMI = 25: 145.3  GEN: WDWN, NAD, Non-toxic, A & O x 3, looks healthy  HEENT: Atraumatic, Normocephalic. Neck supple. No masses, No LAD. Ears and Nose: No external deformity. CV: RRR, No M/G/R. No JVD. No thrill. No extra heart sounds. PULM: CTA B, no wheezes, crackles, rhonchi. No retractions. No resp. distress. No accessory muscle use. ABD: S, NT, ND, +BS. No rebound. No HSM. EXTR: No c/c/e NEURO Normal gait.  PSYCH: Normally interactive. Conversant. Sometimes tearful during exam Gu: vaginal dryness, otherwise no lesion is  noted.  S/p hysterectomy.    Assessment and Plan: Chronic pain syndrome  Vaginal dryness - Plan: estradiol (ESTRACE VAGINAL) 0.1 MG/GM vaginal cream, desvenlafaxine (PRISTIQ) 50 MG 24 hr tablet  Major depressive disorder, recurrent episode, moderate - Plan: desvenlafaxine (PRISTIQ) 50 MG 24 hr tablet  Discussed with pt in detail.  She is somewhat open to the idea that  her sx may not be physical in etiology, althought it is also possible that her upcoming colonoscopy will reveal an answer such as Crohn's disease.  In any case she is willing to try medication for depression.  Denies any SI.  She has tried several medications but would be willing to give pristiq a try.   Also discussed R/B of topical estrogen.  She notes burning and discomfort vaginally- exam is normal except for thin and dry mucous membranes.  She would like to try estrogen cream Asked her to please let me know how she is doing in the next few weeks Recent TSH normal   Signed Lamar Blinks, MD

## 2014-06-11 NOTE — Patient Instructions (Addendum)
Try using the estrogen cream for your vaginal discomfort- I hope that this will help with your dryness Use the pristiq as directed- let me know know how this is doing for you.  I hope that it will help with your pain Try to get some exercise.  I agree that placing your dad in a nursing home may be the best thing for both you you

## 2014-06-19 ENCOUNTER — Encounter: Payer: Self-pay | Admitting: Internal Medicine

## 2014-06-19 ENCOUNTER — Ambulatory Visit (AMBULATORY_SURGERY_CENTER): Payer: Medicare Other | Admitting: Internal Medicine

## 2014-06-19 VITALS — BP 118/68 | HR 86 | Temp 98.0°F | Resp 18 | Ht 62.0 in | Wt 129.0 lb

## 2014-06-19 DIAGNOSIS — R1031 Right lower quadrant pain: Secondary | ICD-10-CM

## 2014-06-19 DIAGNOSIS — R935 Abnormal findings on diagnostic imaging of other abdominal regions, including retroperitoneum: Secondary | ICD-10-CM | POA: Diagnosis not present

## 2014-06-19 DIAGNOSIS — K2100 Gastro-esophageal reflux disease with esophagitis, without bleeding: Secondary | ICD-10-CM

## 2014-06-19 DIAGNOSIS — J45909 Unspecified asthma, uncomplicated: Secondary | ICD-10-CM | POA: Diagnosis not present

## 2014-06-19 DIAGNOSIS — R634 Abnormal weight loss: Secondary | ICD-10-CM

## 2014-06-19 DIAGNOSIS — R109 Unspecified abdominal pain: Secondary | ICD-10-CM | POA: Diagnosis not present

## 2014-06-19 DIAGNOSIS — K208 Other esophagitis: Secondary | ICD-10-CM | POA: Diagnosis not present

## 2014-06-19 DIAGNOSIS — E039 Hypothyroidism, unspecified: Secondary | ICD-10-CM | POA: Diagnosis not present

## 2014-06-19 DIAGNOSIS — K625 Hemorrhage of anus and rectum: Secondary | ICD-10-CM

## 2014-06-19 DIAGNOSIS — R933 Abnormal findings on diagnostic imaging of other parts of digestive tract: Secondary | ICD-10-CM | POA: Diagnosis not present

## 2014-06-19 DIAGNOSIS — K222 Esophageal obstruction: Secondary | ICD-10-CM

## 2014-06-19 MED ORDER — SODIUM CHLORIDE 0.9 % IV SOLN: 500.0000 mL | INTRAVENOUS | Status: DC

## 2014-06-19 MED ORDER — OMEPRAZOLE 40 MG PO CPDR: 40.0000 mg | DELAYED_RELEASE_CAPSULE | Freq: Every day | ORAL | Status: DC

## 2014-06-19 NOTE — Op Note (Signed)
[facility]  [address]. [place], [address]   ENDOSCOPY PROCEDURE REPORT  PATIENT: Mary, Rubio  MR#: [id number] BIRTHDATE: [birth year] , 68  yrs. old GENDER: female ENDOSCOPIST: Eustace Quail, MD REFERRED BY:  Mary Rubio, M.D. PROCEDURE DATE:  06/19/2014 PROCEDURE:  EGD w/ biopsy ASA CLASS:     Class II INDICATIONS:  abdominal pain, abnormal CT, and history of esophageal reflux. MEDICATIONS: Monitored anesthesia care and Propofol 200 mg IV TOPICAL ANESTHETIC: none  DESCRIPTION OF PROCEDURE: After the risks benefits and alternatives of the procedure were thoroughly explained, informed consent was obtained.  The LB [id number] endoscope was introduced through the mouth and advanced to the second portion of the duodenum , Without limitations.  The instrument was slowly withdrawn as the mucosa was fully examined.  EXAM:Esophagus revealed multiple rings throughout the distal half. At the gastroesophageal junction was peptic stricture and active inflammation as well as inflammatory appearing nodule which was biopsied multiple times.  Sonogram revealed a sliding hiatal hernia but was otherwise normal.  The duodenum was normal.  Retroflexed views revealed a hiatal hernia.     The scope was then withdrawn from the patient and the procedure completed.  COMPLICATIONS: There were no immediate complications.  ENDOSCOPIC IMPRESSION: 1. GERD with esophageal stricture and esophagitis 2. Inflammatory appearing nodule at the gastroesophageal junction status post biopsy  RECOMMENDATIONS: 1.  Prescribe omeprazole 40 mg daily; #30; 11 refills. Take 1 each morning 30 minutes before breakfastI 2.  Continue ranitidine. However, take at bedtime 3.  Await biopsy results. Dr. Henrene Pastor will schedule letter 4.  Call office next 2-3 days to schedule an office appointment with Dr. Henrene Pastor in about 6 weeks  REPEAT EXAM:  eSigned:  Eustace Quail, MD 06/19/2014  3:42 PM    CC:The Patient and Mary Lin, MD

## 2014-06-19 NOTE — Progress Notes (Signed)
Called to room to assist during endoscopic procedure.  Patient ID and intended procedure confirmed with present staff. Received instructions for my participation in the procedure from the performing physician.  

## 2014-06-19 NOTE — Op Note (Signed)
[facility]  [address]. [place], [address]   COLONOSCOPY PROCEDURE REPORT  PATIENT: Mary Rubio, Mary Rubio  MR#: [id number] BIRTHDATE: 06-21-1945 , 68  yrs. old GENDER: female ENDOSCOPIST: Eustace Quail, MD REFERRED RA:[id number] Laney Pastor, M.D. PROCEDURE DATE:  06/19/2014 PROCEDURE:   Colonoscopy, diagnostic First Screening Colonoscopy - Avg.  risk and is 50 yrs.  old or older - No.  Prior Negative Screening - Now for repeat screening. N/A  History of Adenoma - Now for follow-up colonoscopy & has been > or = to 3 yrs.  N/A  Recommend repeat exam, <10 yrs? No ASA CLASS:   Class II INDICATIONS:Evaluation of unexplained GI bleeding and Patient is not applicable for Colorectal Neoplasm Risk Assessment for this procedure. . Previous colonoscopies elsewhere. Alternating bowel habits. Chronic GI complaints MEDICATIONS: Monitored anesthesia care and Propofol 200 mg IV  DESCRIPTION OF PROCEDURE:   After the risks benefits and alternatives of the procedure were thoroughly explained, informed consent was obtained.  The digital rectal exam revealed no abnormalities of the rectum.   The LB [id number]  endoscope was introduced through the anus and advanced to the cecum, which was identified by both the appendix and ileocecal valve. No adverse events experienced.   The quality of the prep was excellent. (MoviPrep was used)  The instrument was then slowly withdrawn as the colon was fully examined. Estimated blood loss is zero unless otherwise noted in this procedure report.  COLON FINDINGS: The examined terminal ileum appeared to be normal. A normal appearing cecum, ileocecal valve, and appendiceal orifice were identified.  The ascending, transverse, descending, sigmoid colon, and rectum appeared unremarkable.  Retroflexed views revealed internal hemorrhoids. The time to cecum = 0.6 Withdrawal time = 7.0   The scope was withdrawn and the procedure  completed. COMPLICATIONS: There were no immediate complications.  ENDOSCOPIC IMPRESSION: 1.   The examined terminal ileum appeared normal 2.   Normal colonoscopy 3. Suspect irritable bowel syndrome and chronic abdominal complaints  RECOMMENDATIONS: 1. Routine repeat screening colonoscopy recommended in 10 years 2. Please start either Citrucel or Metamucil 2 tablespoons daily in juice or water 3. You have Bentyl as needed for abdominal pain 4. Upper endoscopy today (please see report)  eSigned:  Eustace Quail, MD 06/19/2014 3:35 PM   cc: [name], MD and The Patient

## 2014-06-19 NOTE — Patient Instructions (Signed)
YOU HAD AN ENDOSCOPIC PROCEDURE TODAY AT THE [facility]:   Refer to the procedure report that was given to you for any specific questions about what was found during the examination.  If the procedure report does not answer your questions, please call your gastroenterologist to clarify.  If you requested that your care partner not be given the details of your procedure findings, then the procedure report has been included in a sealed envelope for you to review at your convenience later.  YOU SHOULD EXPECT: Some feelings of bloating in the abdomen. Passage of more gas than usual.  Walking can help get rid of the air that was put into your GI tract during the procedure and reduce the bloating. If you had a lower endoscopy (such as a colonoscopy or flexible sigmoidoscopy) you may notice spotting of blood in your stool or on the toilet paper. If you underwent a bowel prep for your procedure, you may not have a normal bowel movement for a few days.  Please Note:  You might notice some irritation and congestion in your nose or some drainage.  This is from the oxygen used during your procedure.  There is no need for concern and it should clear up in a day or so.  SYMPTOMS TO REPORT IMMEDIATELY:   Following lower endoscopy (colonoscopy or flexible sigmoidoscopy):  Excessive amounts of blood in the stool  Significant tenderness or worsening of abdominal pains  Swelling of the abdomen that is new, acute  Fever of 100F or higher   Following upper endoscopy (EGD)  Vomiting of blood or coffee ground material  New chest pain or pain under the shoulder blades  Painful or persistently difficult swallowing  New shortness of breath  Fever of 100F or higher  Black, tarry-looking stools  For urgent or emergent issues, a gastroenterologist can be reached at any hour by calling [phone].   DIET: Your first meal following the procedure should be a small meal and then it is ok to progress to  your normal diet. Heavy or fried foods are harder to digest and may make you feel nauseous or bloated.  Likewise, meals heavy in dairy and vegetables can increase bloating.  Drink plenty of fluids but you should avoid alcoholic beverages for 24 hours.  ACTIVITY:  You should plan to take it easy for the rest of today and you should NOT DRIVE or use heavy machinery until tomorrow (because of the sedation medicines used during the test).    FOLLOW UP: Our staff will call the number listed on your records the next business day following your procedure to check on you and address any questions or concerns that you may have regarding the information given to you following your procedure. If we do not reach you, we will leave a message.  However, if you are feeling well and you are not experiencing any problems, there is no need to return our call.  We will assume that you have returned to your regular daily activities without incident.  If any biopsies were taken you will be contacted by phone or by letter within the next 1-3 weeks.  Please call us at [phone] if you have not heard about the biopsies in 3 weeks.    SIGNATURES/CONFIDENTIALITY: You and/or your care partner have signed paperwork which will be entered into your electronic medical record.  These signatures attest to the fact that that the information above on your After Visit Summary has been reviewed   and is understood.  Full responsibility of the confidentiality of this discharge information lies with you and/or your care-partner. Please start either Citrucel or Metamucil 2 tablespoons daily in juice water Take Bentyl as needed for abdominal pain Take new prescribed medication Omeprazole 40 mg by mouth daily, medication sent to [facility] per preference Continue taking Ranitidine; take at bedtime Await biopsy results, you will be notified Call office in the next 2-3 days to schedule an office visit appointment with Dr. Henrene Pastor  in about 6 weeks

## 2014-06-19 NOTE — Progress Notes (Signed)
To recovery, report to Smith, RN, VSS 

## 2014-06-20 ENCOUNTER — Telehealth: Payer: Self-pay

## 2014-06-20 NOTE — Telephone Encounter (Signed)
  Follow up Call-  Call back number 06/19/2014  Post procedure Call Back phone  # [phone]  Permission to leave phone message Yes     Patient questions:  Do you have a fever, pain , or abdominal swelling? No. Pain Score  0 *  Have you tolerated food without any problems? Yes.    Have you been able to return to your normal activities? Yes.    Do you have any questions about your discharge instructions: Diet   No. Medications  No. Follow up visit  No.  Do you have questions or concerns about your Care? Yes.    Actions: * If pain score is 4 or above: No action needed, pain <4. Patient said her throat was sore and she had a temperature of 99.5 at 0300am. Advised patient to gargle warm salt water every 1-11/2 hours today and to call back if sore throat gets worse or if her temperature goes above 100.0 degrees.

## 2014-06-25 ENCOUNTER — Telehealth: Payer: Self-pay | Admitting: Internal Medicine

## 2014-06-25 ENCOUNTER — Other Ambulatory Visit: Payer: Self-pay

## 2014-06-25 MED ORDER — PANTOPRAZOLE SODIUM 40 MG PO TBEC: DELAYED_RELEASE_TABLET | ORAL | Status: DC

## 2014-06-25 NOTE — Telephone Encounter (Signed)
This very kind patient is having diarrhea and nausea that started with Prilosec. She has not taken it today and has noted some improvement though she feels very tired. She asks if you will change her PPI. Please advise.

## 2014-06-25 NOTE — Telephone Encounter (Signed)
Left patient the information on her voicemail. Rx to [facility].

## 2014-06-25 NOTE — Telephone Encounter (Signed)
Change to pantoprazole 40 mg daily

## 2014-06-28 ENCOUNTER — Telehealth: Payer: Self-pay | Admitting: Internal Medicine

## 2014-06-28 NOTE — Telephone Encounter (Signed)
Please read message below and advise.  

## 2014-06-28 NOTE — Telephone Encounter (Signed)
Sure: TSH and fT4

## 2014-06-28 NOTE — Telephone Encounter (Signed)
Pt has been very ill and is being followed by GI for the issue. But she feels that her thyroid is not right and wants to see if we can check her levels

## 2014-06-29 ENCOUNTER — Other Ambulatory Visit: Payer: Self-pay | Admitting: *Deleted

## 2014-06-29 ENCOUNTER — Other Ambulatory Visit: Payer: Medicare Other

## 2014-06-29 DIAGNOSIS — E039 Hypothyroidism, unspecified: Secondary | ICD-10-CM

## 2014-06-29 LAB — T4, FREE: Free T4: 0.85 ng/dL (ref 0.60–1.60)

## 2014-06-29 LAB — TSH: TSH: 2.36 u[IU]/mL (ref 0.35–4.50)

## 2014-06-29 NOTE — Telephone Encounter (Signed)
Called pt and advised her Dr Cruzita Lederer said she could come have there thyroid tests done. Pt voiced understanding will come in.

## 2014-06-30 ENCOUNTER — Encounter: Payer: Self-pay | Admitting: Internal Medicine

## 2014-07-05 ENCOUNTER — Telehealth: Payer: Self-pay | Admitting: Internal Medicine

## 2014-07-05 NOTE — Telephone Encounter (Signed)
Patient given results as per letter on 06/30/14.

## 2014-07-10 DIAGNOSIS — H40023 Open angle with borderline findings, high risk, bilateral: Secondary | ICD-10-CM | POA: Diagnosis not present

## 2014-07-24 ENCOUNTER — Ambulatory Visit (INDEPENDENT_AMBULATORY_CARE_PROVIDER_SITE_OTHER): Payer: Medicare Other | Admitting: Internal Medicine

## 2014-07-24 ENCOUNTER — Encounter: Payer: Self-pay | Admitting: Internal Medicine

## 2014-07-24 VITALS — BP 110/70 | HR 76 | Temp 98.4°F | Resp 15 | Ht 62.5 in | Wt 123.0 lb

## 2014-07-24 DIAGNOSIS — M8580 Other specified disorders of bone density and structure, unspecified site: Secondary | ICD-10-CM | POA: Diagnosis not present

## 2014-07-24 DIAGNOSIS — K2980 Duodenitis without bleeding: Secondary | ICD-10-CM

## 2014-07-24 DIAGNOSIS — E039 Hypothyroidism, unspecified: Secondary | ICD-10-CM | POA: Diagnosis not present

## 2014-07-24 DIAGNOSIS — M859 Disorder of bone density and structure, unspecified: Secondary | ICD-10-CM

## 2014-07-24 LAB — VITAMIN D 25 HYDROXY (VIT D DEFICIENCY, FRACTURES): VITD: 23.35 ng/mL — ABNORMAL LOW (ref 30.00–100.00)

## 2014-07-24 NOTE — Progress Notes (Signed)
Patient ID: Mary Rubio, female   DOB: 22-Dec-1945, 69 y.o.   MRN: [id number]   HPI  Mary Rubio is a 69 y.o.-year-old female, retruning for f/u for hypothyroidism, dx 1996. Last visit 6 mo ago.  She bend over and broke a rib since last visit >> excruciating pain >> went to the ED.  She also had an EGD and colonoscopy >> inflammation in her stomach and small intestine. She was negative for C diff and celiac disease. She had a course of Flagyl. She then started PPIs.  She could not tolerate this. She is now back on Ranitidine.   She is trying to do an acid-free diet.  Pt. is on Synthroid DAW 100 mcg, taken: - fasting - with water - no b'fast  - + calcium with lunch - + Ranitine with lunch - tried Protonix- did not tolerate this - no iron, multivitamins   I reviewed pt's thyroid tests: Lab Results  Component Value Date   TSH 2.36 06/29/2014   TSH 1.84 02/27/2014   TSH 0.948 01/22/2014   TSH 0.68 10/19/2013   TSH 0.49 08/31/2013   TSH 4.728* 03/08/2013   TSH 9.188* 12/28/2012   TSH 0.299* 09/18/2012   TSH 2.096 04/27/2012   TSH 1.222 09/10/2011   FREET4 0.85 06/29/2014   FREET4 0.99 02/27/2014   FREET4 1.86* 01/22/2014   FREET4 1.10 10/19/2013   FREET4 1.00 08/31/2013   FREET4 1.26 03/08/2013   FREET4 1.35 04/27/2012  08/17/2013: TSH 3.45, fT4 0.70 >> she was advised to increase the LT4 to 125 mcg, but she did not do it. She feel better on  112 mcg.  07/04/2013: TSH 6.81 >> dose of LT4 increased from 100 to 112 mcg  She tells me she feels better when TSH is higher, around 2.  Pt describes: - + fatigue  - no palpitations - + weight loss - 6 lbs; she started going to the gym before last visit - no  cold intolerance - + N/V/reflux/diarrhea - + hair loss - + depression/+ anxiety - tried antidepressants (Prozac) >> felt terrible  Pt denies feeling nodules in neck, hoarseness, dysphagia/odynophagia, SOB with lying down.  She had a h/o radiation tx to head  or neck (for ear infection >> experimental tx - several tx's)  I reviewed her chart and she also has a history of GERD, low bone density - started Silver sneakers, hiatal hernia.   ROS: Constitutional: see HPI, + poor sleep Eyes: no blurry vision, no xerophthalmia ENT: no sore throat, no nodules palpated in throat, no dysphagia/odynophagia Cardiovascular: no CP/SOB/+ palpitations/no leg swelling Respiratory: no cough/no SOB Gastrointestinal: + N/+ V/+ D/no C, + heartburn Musculoskeletal: no muscle aches/+ joint aches Skin: no rashes,+ hair loss Neurological: no tremors/numbness/tingling/dizziness, + HA  I reviewed pt's medications, allergies, PMH, social hx, family hx, and changes were documented in the history of present illness. Otherwise, unchanged from my initial visit note: Past Medical History  Diagnosis Date  . Environmental allergies     Weekly allergy shots  . Depression   . GERD (gastroesophageal reflux disease)   . Hypothyroidism   . Anxiety   . Hyperlipidemia   . Hiatal hernia   . Asthma   . IBS (irritable bowel syndrome)   . Diverticulosis   . Colon polyps   . Osteoporosis   . Allergy    Past Surgical History  Procedure Laterality Date  . Abdominal hysterectomy    . Cholecystectomy    . Bunionectomy Right   .  Thumb surgery Left     for cyst  . Exploratory laparotomy      endometriosis  . Bartholin gland cyst excision    . Facial cyst      excision  . Esophagogastroduodenoscopy N/A 10/21/2012    Procedure: ESOPHAGOGASTRODUODENOSCOPY (EGD);  Surgeon: Beryle Beams, MD;  Location: [facility];  Service: Endoscopy;  Laterality: N/A;  . Nasal septum surgery     History   Social History  . Marital Status: Single    Spouse Name: N/A    Number of Children: 1  . Years of Education: college   Occupational History  . caregiver    Social History Main Topics  . Smoking status: Never Smoker   . Smokeless tobacco: No  . Alcohol Use: No  . Drug Use: No    Current Outpatient Prescriptions on File Prior to Visit  Medication Sig Dispense Refill  . albuterol (PROVENTIL HFA;VENTOLIN HFA) 108 (90 BASE) MCG/ACT inhaler Inhale 2 puffs into the lungs every 4 (four) hours as needed for wheezing or shortness of breath (cough, shortness of breath or wheezing.). 1 Inhaler 12  . desvenlafaxine (PRISTIQ) 50 MG 24 hr tablet Take 1 tablet (50 mg total) by mouth daily. 30 tablet 3  . dicyclomine (BENTYL) 20 MG tablet Take 1 tablet (20 mg total) by mouth 3 (three) times daily before meals. (Patient not taking: Reported on 06/19/2014) 90 tablet 3  . estradiol (ESTRACE VAGINAL) 0.1 MG/GM vaginal cream One 1 gram vaginally for one week, then taper to 1-3x a week to maintain. 42.5 g 12  . pantoprazole (PROTONIX) 40 MG tablet 1 tablet by mouth every morning at least 30 minutes before the first meal. 90 tablet 3  . Polyethyl Glycol-Propyl Glycol (SYSTANE OP) Place 1 drop into both eyes daily.    . ranitidine (ZANTAC) 300 MG tablet Take 1 tablet (300 mg total) by mouth 2 (two) times daily. 60 tablet 6  . SYNTHROID 100 MCG tablet Take 1 tablet (100 mcg total) by mouth daily before breakfast. 60 tablet 2   No current facility-administered medications on file prior to visit.   Allergies  Allergen Reactions  . Augmentin [Amoxicillin-Pot Clavulanate] Other (See Comments)    Stomach pain   . Crestor [Rosuvastatin]     Myalgias  . Demerol [Meperidine]   . Latex Other (See Comments)    Canker sores in mouth (discovered by dentist)  . Meperidine Hcl Other (See Comments)    Does not remember   . Morphine And Related Itching    Has tolerated small doses of hydrocodone  . Pentazocine Lactate Nausea And Vomiting    Sick on stomach  . Simvastatin Other (See Comments)    Muscle aches   . Talwin [Pentazocine]   . Codeine Rash    Has tolerated small doses of hydrocodone  . Morphine Rash   Family History  Problem Relation Age of Onset  . Lung cancer Mother   .  Hypertension Mother   . Skin cancer Father   . Dementia Father   . Heart disease Sister   . Thyroid disease Daughter   . Heart disease Maternal Grandfather   . Heart disease Paternal Grandmother   . Thyroid disease Mother   . Arrhythmia Father   . CAD Sister     MI/stent age 4, CABG age 29  . Heart disease Father     Several relatives on dad's side have had MIs/CABG  . Heart disease Mother   . Thyroid disease  Sister    PE: BP 110/70 mmHg  Pulse 76  Temp(Src) 98.4 F (36.9 C) (Oral)  Resp 15  Ht 5' 2.5" (1.588 m)  Wt 123 lb (55.792 kg)  BMI 22.12 kg/m2  SpO2 98% Wt Readings from Last 3 Encounters:  07/24/14 123 lb (55.792 kg)  06/19/14 129 lb (58.514 kg)  06/11/14 127 lb (57.607 kg)   Constitutional: normal weight, in NAD Eyes: PERRLA, EOMI, no exophthalmos ENT: moist mucous membranes, no thyromegaly, no cervical lymphadenopathy Cardiovascular: RRR, No MRG Respiratory: CTA B Gastrointestinal: abdomen soft, NT, ND, BS+ Musculoskeletal: no deformities, strength intact in all 4 Skin: moist, warm, no rashes Neurological: no tremor with outstretched hands, DTR normal in all 4  ASSESSMENT: 1. Hypothyroidism - Thyroid U/S (03/17/2013) - a nonenlarged thyroid, with very small hypoechoic regions:  Right lobe: 4.2 x 1.5 x 1.2 cm. There are multiple hypoechogenic to anechoic nodules, measuring under 2 mm in diameter  Left lobe: 3.9 x 1.3 x 1.5 cm. There are hypoechoic nodules in the mid pole and lower pole of the left thyroid lobe, with the largest nodule measuring 4 mm in diameter. There is a minimal 1 x 3 mm diameter shadowing calcification in the midpole >> no goiter, small hypoechoic areas, all <4 mm >> I advised her that no follow up is needed for this   2. GI inflammation  3. Osteopenia  PLAN:  1. Patient with long-standing hypothyroidism, on Synthroid, with normal thyroid hh levels. Last set obtained <1 mo ago >> normal. She appears euthyroid; she would like to keep  TSH around 2-3 as she feels better at that level. She has had a hard time in the last few mo 2/2 several medical issues (see HPI) - She does not appear to have a goiter, thyroid nodules, or neck compression symptoms. - We discussed about correct intake of levothyroxine, fasting, with water, separated by at least 30 minutes from breakfast, and separated by more than 4 hours from calcium, iron, multivitamins, acid reflux medications (PPIs). She is taking it correctly. - will check thyroid tests at next visit, but reviewed her last set - continue Synthroid DAW 100 mcg daily for now - I will see her back in 6 months  2. GI inflammation - she is not sure why she has this - she is changing her diet >> I suggested a plant-based diet - she would like a referral to nutrition >> done  3. Osteopenia - she brings her DEXA scan and we reviewed this together, but only briefly 2/2 lack of time >> osteopenia - we will check her vit D level at her request - she started an OTC supplement of vit D recently >> continue  Office Visit on 07/24/2014  Component Date Value Ref Range Status  . VITD 07/24/2014 23.35* 30.00 - 100.00 ng/mL Final   I advised her to increase the vitamin D to 4000 units daily.

## 2014-07-24 NOTE — Patient Instructions (Addendum)
Please return in 6 months.   Please consider the following ways to improve your diet: - substitute whole grain for white bread or pasta - substitute brown rice for white rice - substitute 90-calorie flat bread pieces for slices of bread when possible - substitute sweet potatoes or yams for white potatoes - substitute humus for margarine - substitute tofu for cheese when possible - substitute almond or rice milk for regular milk (would not drink soy milk daily due to concern for soy estrogen influence on breast cancer risk) - substitute dark chocolate for other sweets when possible - substitute water - can add lemon or orange slices for taste - for diet sodas (artificial sweeteners will trick your body that you can eat sweets without getting calories and will lead you to overeating and weight gain in the long run) - do not skip breakfast or other meals (this will slow down the metabolism and will result in more weight gain over time)  - can try smoothies made from fruit and almond/rice milk in am instead of regular breakfast - can also try old-fashioned (not instant) oatmeal made with almond/rice milk in am - order the dressing on the side when eating salad at a restaurant (pour less than half of the dressing on the salad) - eat as little meat as possible - can try juicing, but should not forget that juicing will get rid of the fiber, so would alternate with eating raw veg./fruits or drinking smoothies - use as little oil as possible, even when using olive oil - can dress a salad with a mix of balsamic vinegar and lemon juice, for e.g. - use agave nectar, stevia sugar, or regular sugar rather than artificial sweateners - steam or broil/roast veggies  - snack on veggies/fruit/nuts (unsalted, preferably) when possible, rather than processed foods - reduce or eliminate aspartame in diet (it is in diet sodas, chewing gum, etc) Read the labels!  Try to read Dr. Janene Harvey book: "Program for  Reversing Diabetes" for the vegan concept and other ideas for healthy eating.  Plant-based diet materials:  - Documentaries:  [place] over [employer], Sick and Nearly Dead  The [employer]  Overweight and undernourished - Books:  Alyssa Grove: "Program for Reversing Diabetes"  Heath Gold: "The [place] Study"  Norma Fredrickson: "Supermarket Vegan" (cookbook) - Facebook pages:   Forks versus Knives  Vegucated  [employer]ters - Healthy nutrition info websites:  https://www.martin.info/ - Cinnamon issue:  BowlingGrip.is  CreditG[employer].fr.aspx

## 2014-08-01 ENCOUNTER — Ambulatory Visit (INDEPENDENT_AMBULATORY_CARE_PROVIDER_SITE_OTHER): Payer: Medicare Other | Admitting: Internal Medicine

## 2014-08-01 ENCOUNTER — Encounter: Payer: Self-pay | Admitting: Internal Medicine

## 2014-08-01 VITALS — BP 90/50 | HR 72 | Ht 62.5 in | Wt 121.4 lb

## 2014-08-01 DIAGNOSIS — K589 Irritable bowel syndrome without diarrhea: Secondary | ICD-10-CM

## 2014-08-01 DIAGNOSIS — K222 Esophageal obstruction: Secondary | ICD-10-CM

## 2014-08-01 DIAGNOSIS — R935 Abnormal findings on diagnostic imaging of other abdominal regions, including retroperitoneum: Secondary | ICD-10-CM

## 2014-08-01 DIAGNOSIS — K219 Gastro-esophageal reflux disease without esophagitis: Secondary | ICD-10-CM | POA: Diagnosis not present

## 2014-08-01 DIAGNOSIS — R634 Abnormal weight loss: Secondary | ICD-10-CM

## 2014-08-01 NOTE — Patient Instructions (Signed)
Please follow up in one year 

## 2014-08-01 NOTE — Progress Notes (Signed)
HISTORY OF PRESENT ILLNESS:  Mary Rubio is a 69 y.o. female with past medical history as listed below. She was evaluated by the GI physician assistant 05/21/2014 regarding epigastric pain, weight loss, and abnormal CT scan. She has seen multiple gastroenterologists in the past including Dr. Velora Rubio, Dr. Sharlett Rubio, most recently Dr. Collene Rubio. She was set up see me for colonoscopy and upper endoscopy which were performed 06/19/2014. Colonoscopy was entirely normal. The terminal ileum was intubated and appeared normal. Her chronic alternating bowel habits and chronic GI complaints are felt to be functional in nature. Fiber supplementation was recommended. Bentyl prescribed for pain. Upper endoscopy revealed multiple distal rings in the most distal esophagus with inflammatory appearing nodule at the gastroesophageal junction. Multiple biopsies taken and revealed chronic inflammation with prominence muscle. The endoscopic appearance was that of reflux with peptic stricture. She was prescribed omeprazole 40 mg daily and to continue ranitidine at bedtime. Follow-up at this time recommended. First, patient is pleased to report that she has improved overall. She states that she did not tolerate omeprazole then subsequently pantoprazole. She states that the proton pump inhibitors resulted in severe side effects which included "I could not get out of the bed, depressed, flulike aches". On the advice of her daughter she tells me that she began a "low acid diet". On this she states that she has no reflux symptoms and improved bowel habits. Is not clear if she tried fiber. She is currently taking ranitidine 150 mg in the morning and 300 mg at night. She does have mild weight loss but has been eating less.  REVIEW OF SYSTEMS:  All non-GI ROS negative except for essentially positive including sinus and allergy, anxiety, back pain, blood in urine, visual change, cough, depression, fatigue, night sweats, muscle cramps,  muscle pain, itching, heart rhythm change, headaches, sleeping problems, shortness of breath  Past Medical History  Diagnosis Date  . Environmental allergies     Weekly allergy shots  . Depression   . GERD (gastroesophageal reflux disease)   . Hypothyroidism   . Anxiety   . Hyperlipidemia   . Hiatal hernia   . Asthma   . IBS (irritable bowel syndrome)   . Diverticulosis   . Colon polyps   . Osteoporosis   . Allergy   . Osteopenia   . Esophageal stricture     Past Surgical History  Procedure Laterality Date  . Abdominal hysterectomy    . Cholecystectomy    . Bunionectomy Right   . Thumb surgery Left     for cyst  . Exploratory laparotomy      endometriosis  . Bartholin gland cyst excision    . Facial cyst      excision  . Esophagogastroduodenoscopy N/A 10/21/2012    Procedure: ESOPHAGOGASTRODUODENOSCOPY (EGD);  Surgeon: Mary Beams, MD;  Location: [facility];  Service: Endoscopy;  Laterality: N/A;  . Nasal septum surgery      Social History Mary Rubio  reports that she has never smoked. She has never used smokeless tobacco. She reports that she does not drink alcohol or use illicit drugs.  family history includes Arrhythmia in her father; CAD in her sister; Dementia in her father; Heart disease in her father, maternal grandfather, mother, paternal grandmother, and sister; Hypertension in her mother; Lung cancer in her mother; Skin cancer in her father; Thyroid disease in her daughter, mother, and sister.  Allergies  Allergen Reactions  . Mary Rubio [Amoxicillin-Pot Clavulanate] Other (See Comments)    Stomach pain   .  Mary Rubio [Rosuvastatin]     Myalgias  . Mary Rubio [Meperidine]   . Latex Other (See Comments)    Canker sores in mouth (discovered by dentist)  . Meperidine Hcl Other (See Comments)    Does not remember   . Morphine And Related Itching    Has tolerated small doses of hydrocodone  . Pentazocine Lactate Nausea And Vomiting    Sick on  stomach  . Simvastatin Other (See Comments)    Muscle aches   . Mary Rubio [Pentazocine]   . Codeine Rash    Has tolerated small doses of hydrocodone  . Morphine Rash       PHYSICAL EXAMINATION: Vital signs: BP 90/50 mmHg  Pulse 72  Ht 5' 2.5" (1.588 m)  Wt 121 lb 6 oz (55.055 kg)  BMI 21.83 kg/m2 General: Well-developed, well-nourished, no acute distress HEENT: Sclerae are anicteric, conjunctiva pink. Oral mucosa intact Lungs: Clear Heart: Regular Abdomen: soft, nontender, nondistended, no obvious ascites, no peritoneal signs, normal bowel sounds. No organomegaly. Extremities: No clubbing cyanosis or edema Psychiatric: alert and oriented x3. Cooperative   ASSESSMENT:  #1. GERD with peptic stricture and esophagitis. She satisfied with symptom control on twice daily ranitidine #2. Chronic functional abdominal complaints. Ongoing. Improved present time with "acid free diet" #3. Normal colonoscopy and ileal intubation May 2016 #4. Multiple medical problems   PLAN:  #1. Reflux precautions #2. Continue ranitidine. Recommended 300 mg twice a day #3. Continue Bentyl as needed for pain #4. Routine office follow-up in 1 year  25 minutes was spent face-to-face with this patient. The overwhelming majority of the time was spent counseling her regarding her multiple GI complaints and her multiple questions

## 2014-08-09 ENCOUNTER — Encounter: Payer: Self-pay | Admitting: Dietician

## 2014-08-09 ENCOUNTER — Encounter: Payer: Medicare Other | Attending: Internal Medicine | Admitting: Dietician

## 2014-08-09 VITALS — Ht 62.5 in | Wt 121.0 lb

## 2014-08-09 DIAGNOSIS — R634 Abnormal weight loss: Secondary | ICD-10-CM | POA: Insufficient documentation

## 2014-08-09 DIAGNOSIS — K219 Gastro-esophageal reflux disease without esophagitis: Secondary | ICD-10-CM | POA: Diagnosis not present

## 2014-08-09 DIAGNOSIS — Z713 Dietary counseling and surveillance: Secondary | ICD-10-CM | POA: Insufficient documentation

## 2014-08-09 NOTE — Patient Instructions (Signed)
Add more food to breakfast. (fruit, toast, pancakes or waffles) Be sure to get in adequate fluid intake.  (6-8 cups daily) Join the [employer].   Eat when you are hungry.   Consider supplements like Ensure or Boost Continue no caffeine, spicy and acid foods. (no cinnamon or black pepper). Continue low fat food choices. Consider deglycerinated licorice as needed for heartburn. Lorayne Bender, PhD, RD (allergy testing) [phone]

## 2014-08-09 NOTE — Progress Notes (Signed)
  Medical Nutrition Therapy:  Appt start time: 1030 end time:  1115.   Assessment:  Primary concerns today: Patient is here alone.  She is concerned about her allergies, stomach problems and continued weight loss.   UBW 136 lbs now decreased to 121 lbs.  Hx includes hypothyroidism, Hiatal Hernia, remote hx of diverticulitis, GI inflammation on recent colonoscopy/endoscopy with negative celiac and c.diff, and osteopenia with recent broken rib from bending over.  Her Vitamin D was 23 on 07/24/14 and was started on Vitamin D supplement.  She complains of increased stomach pain, night sweats, and difficulty sleeping at times.  She complains of blood in her stool at times with no known cause.  She has multiple drug and environmental allergies and came off of allergy shots about 6 months ago.  She has had her gallbladder removed and gets diarrhea with increased fat in the diet.    She is lactose intolerant.  She cannot tolerate citrus fruit, black pepper, chocolate, coffee, fried goods, tomatoes, raw onions and garlic.  She does not eat out often because of food intolerances.  She tried multiple stomach medications, some of which worsened symptoms.  She does tolerate Zantac which helps.  Her diet is increased beans and vegetables and low amounts of bread, small amounts of chicken, [place] and fish but overall does not like a lot of meat or eat them often.  She reports that some of her family have similar intolerances.  Tea causes diarrhea and increased heart rate.  She cares for her 81 yo father who lives next door who has had dementia for a number of years.  She reports good social support from friends, neighbors, bible study and works to decrease stress.  Preferred Learning Style:   No preference indicated   Learning Readiness:   Ready  MEDICATIONS: see list   DIETARY INTAKE: 24-hr recall:  B ( AM): grits or oatmeal  Snk ( AM): none  L ( PM): vegetables, legumes, potatoes or rice or quinoa Snk (  PM): fruit D ( PM): vegetables, legumes, potatoes, or rice or quinoa with chicken Snk ( PM): pop sickle  Beverages: water, white grape juice in small amounts  Usual physical activity: ADL's  Estimated energy needs: 1600 calories 50-60 g protein 40 g fat  Progress Towards Goal(s):  In progress.   Nutritional Diagnosis:  Craig-3.2 Unintentional weight loss As related to GI problems.  As evidenced by patient report.    Intervention:  Nutrition Education/Counseling regarding a balanced diet to maintain weight within her food preferences and tolerance.    Add more food to breakfast. (fruit, toast, pancakes or waffles) Be sure to get in adequate fluid intake.  (6-8 cups daily) Join the [employer].   Eat when you are hungry.   Consider supplements like Ensure or Boost Continue no caffeine, spicy and acid foods. (no cinnamon or black pepper). Continue low fat food choices. Consider deglycerinated licorice as needed for heartburn. Lorayne Bender, PhD, RD (allergy testing) [phone]  Teaching Method Utilized:  Visual Auditory  Barriers to learning/adherence to lifestyle change: caregiver   Demonstrated degree of understanding via:  Teach Back   Monitoring/Evaluation:  Dietary intake, exercise, and body weight prn.

## 2014-08-14 ENCOUNTER — Ambulatory Visit (INDEPENDENT_AMBULATORY_CARE_PROVIDER_SITE_OTHER): Payer: Medicare Other | Admitting: Internal Medicine

## 2014-08-14 VITALS — BP 122/72 | HR 96 | Temp 98.6°F | Resp 17 | Ht 63.5 in | Wt 123.0 lb

## 2014-08-14 DIAGNOSIS — R634 Abnormal weight loss: Secondary | ICD-10-CM

## 2014-08-14 DIAGNOSIS — M79662 Pain in left lower leg: Secondary | ICD-10-CM

## 2014-08-14 DIAGNOSIS — R58 Hemorrhage, not elsewhere classified: Secondary | ICD-10-CM

## 2014-08-14 DIAGNOSIS — G4700 Insomnia, unspecified: Secondary | ICD-10-CM

## 2014-08-14 DIAGNOSIS — M79661 Pain in right lower leg: Secondary | ICD-10-CM | POA: Diagnosis not present

## 2014-08-14 DIAGNOSIS — R5382 Chronic fatigue, unspecified: Secondary | ICD-10-CM | POA: Diagnosis not present

## 2014-08-14 LAB — POCT CBC
Granulocyte percent: 64.9 %G (ref 37–80)
HCT, POC: 38.8 % (ref 37.7–47.9)
Hemoglobin: 12.7 g/dL (ref 12.2–16.2)
Lymph, poc: 2 (ref 0.6–3.4)
MCH, POC: 30.2 pg (ref 27–31.2)
MCHC: 32.8 g/dL (ref 31.8–35.4)
MCV: 92 fL (ref 80–97)
MID (cbc): 0.4 (ref 0–0.9)
MPV: 7.1 fL (ref 0–99.8)
POC Granulocyte: 4.5 (ref 2–6.9)
POC LYMPH PERCENT: 29.1 %L (ref 10–50)
POC MID %: 6 %M (ref 0–12)
Platelet Count, POC: 211 10*3/uL (ref 142–424)
RBC: 4.22 M/uL (ref 4.04–5.48)
RDW, POC: 13.2 %
WBC: 7 10*3/uL (ref 4.6–10.2)

## 2014-08-14 LAB — POCT SEDIMENTATION RATE: POCT SED RATE: 32 mm/hr — AB (ref 0–22)

## 2014-08-14 NOTE — Progress Notes (Signed)
Subjective:  This chart was scribed for Mary Lin, MD by Leandra Kern, Medical Scribe. This patient was seen in Room 8 and the patient's care was started at 5:10 PM.   Patient ID: Mary Rubio, female    DOB: 1945-07-01, 69 y.o.   MRN: [id number]  Chief Complaint  Patient presents with  . Leg Injury    lump on leg   . Foot Injury    HPI HPI Comments: Mary Rubio is a 69 y.o. female who presents to [facility] complaining of a right leg injury.  Pt notes that she first noticed the pain in her ankle while she was taking a shower. Pt reports that the area is very tender to palpation and is swollen, however the swelling has decreased. She states that the pain radiates through her leg to her knee. She notes that she had a major right knee surgery about 30 years ago. Pt denies falls or any injuries to the ankle. Pt also indicates that she stepped on an object where she had a blood vessel pop on an area right below her right great toe. She also has had both calves hurting for many weeks now. They hurt when she walks. They sometimes ache at night. There are no cramps.  Pt states that she started an acid-free diet. She noticed that she lost about 16 lbs in the past 3 months, with BMI decreasing from 24 to 21. Pt indicates that she started losing weight before her change in diet however. She states that she was advised by Dr. Renne Crigler that it was due to her change in diet. She also reports that she is experiencing hypotension recently. She indicates that her diet is healthy and she is happy with it. She does report that has been experiencing an increase in appetite. Pt notes that she gets dizzy spells and feels fatigued. Pt indicates that she recently joined the [employer] for exercise.   She says she is always tired. Pt reports having severe insomnia. She indicates that she cannot fall asleep unless she is extremely tired. Sleeps poorly. Takes a long time to fall  asleep. Unfortunately she naps whenever she feels like in her sleep cycle is disrupted.  Her Vitamin D was 23 on 07/24/14 and was started on Vitamin D supplement. Nutritionist think she is lactose intolerant Dr. Leslee Home endoscopy found inflammation but negative for C. difficile and celiac and H pylori. Couldn't tolerate PPI and back on ranitidine On 100  micrograms of Synthroid Dr Gherghe///osteopenia noted with increase of vitamin D to 4000 units daily when vitamin D level XXIII  Patient Active Problem List   Diagnosis Date Noted  . Osteopenia 07/24/2014  . Depression 06/11/2014  . Vaginal lesion 04/12/2013  . Dysphagia 05/26/2012  . Esophageal reflux 05/26/2012  . Diaphragmatic hernia 05/26/2012  . 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 Precordial pain 05/12/2012  . Hypothyroidism 08/14/2007  . HYPERLIPIDEMIA 08/14/2007  . ALLERGIC RHINITIS 08/14/2007  . ASTHMA 08/14/2007   Past Medical History  Diagnosis Date  . Environmental allergies     Weekly allergy shots  . Depression   . GERD (gastroesophageal reflux disease)   . Hypothyroidism   . Anxiety   . Hyperlipidemia   . Hiatal hernia   . Asthma   . IBS (irritable bowel syndrome)   . Diverticulosis   . Colon polyps   . Osteoporosis   . Allergy   . Osteopenia   . Esophageal stricture    Past  Surgical History  Procedure Laterality Date  . Abdominal hysterectomy    . Cholecystectomy    . Bunionectomy Right   . Thumb surgery Left     for cyst  . Exploratory laparotomy      endometriosis  . Bartholin gland cyst excision    . Facial cyst      excision  . Esophagogastroduodenoscopy N/A 10/21/2012    Procedure: ESOPHAGOGASTRODUODENOSCOPY (EGD);  Surgeon: Beryle Beams, MD;  Location: [facility];  Service: Endoscopy;  Laterality: N/A;  . Nasal septum surgery     Allergies  Allergen Reactions  . Augmentin [Amoxicillin-Pot Clavulanate] Other (See Comments)    Stomach pain   . Crestor [Rosuvastatin]     Myalgias  . Demerol  [Meperidine]   . Latex Other (See Comments)    Canker sores in mouth (discovered by dentist)  . Meperidine Hcl Other (See Comments)    Does not remember   . Morphine And Related Itching    Has tolerated small doses of hydrocodone  . Pentazocine Lactate Nausea And Vomiting    Sick on stomach  . Simvastatin Other (See Comments)    Muscle aches   . Talwin [Pentazocine]   . Codeine Rash    Has tolerated small doses of hydrocodone  . Morphine Rash   Prior to Admission medications   Medication Sig Start Date End Date Taking? Authorizing Provider  albuterol (PROVENTIL HFA;VENTOLIN HFA) 108 (90 BASE) MCG/ACT inhaler Inhale 2 puffs into the lungs every 4 (four) hours as needed for wheezing or shortness of breath (cough, shortness of breath or wheezing.). 02/06/14  Yes Roselee Culver, MD  Polyethyl Glycol-Propyl Glycol (SYSTANE OP) Place 1 drop into both eyes daily.   Yes Historical Provider, MD  ranitidine (ZANTAC) 300 MG tablet Take 1 tablet (300 mg total) by mouth 2 (two) times daily. Patient taking differently: Take 450 mg by mouth 2 (two) times daily.  05/21/14  Yes Amy S Esterwood, PA-C  SYNTHROID 100 MCG tablet Take 1 tablet (100 mcg total) by mouth daily before breakfast. 01/23/14  Yes Philemon Kingdom, MD  Vitamin D, Ergocalciferol, (DRISDOL) 50000 UNITS CAPS capsule Take 4,000 Units by mouth every 7 (seven) days.   Yes Historical Provider, MD   History   Social History  . Marital Status: Single    Spouse Name: N/A  . Number of Children: 1  . Years of Education: college   Occupational History  . caregiver    Social History Main Topics  . Smoking status: Never Smoker   . Smokeless tobacco: Never Used  . Alcohol Use: No  . Drug Use: No  . Sexual Activity: No   Other Topics Concern  . Not on file   Social History Narrative    Review of Systems  Constitutional: Positive for appetite change, fatigue and unexpected weight change.  Musculoskeletal: Positive for myalgias,  joint swelling and arthralgias.  Skin: Positive for wound.  Neurological: Positive for dizziness.  Psychiatric/Behavioral: Positive for sleep disturbance.      Objective:   Physical Exam  Constitutional: She is oriented to person, place, and time. She appears well-developed and well-nourished. No distress.  HENT:  Head: Normocephalic and atraumatic.  Eyes: EOM are normal. Pupils are equal, round, and reactive to light.  Neck: Neck supple.  Cardiovascular: Normal rate.   Pulmonary/Chest: Effort normal.  Musculoskeletal:  She has a tender slightly swollen area along the distal lateral tibia without ecchymoses. She also has tenderness under her first toe where there  is an obvious broken vessel with hematoma. Joint moves well first MTP. Both calves are tender to palpation without defect or cord. Negative pain with dorsiflexion foot bilaterally no Achilles tendinitis. The calves are not swollen. There are good distal pulses  Neurological: She is alert and oriented to person, place, and time. No cranial nerve deficit.  Skin: Skin is warm and dry.  Psychiatric: She has a normal mood and affect. Her behavior is normal.  Nursing note and vitals reviewed.  BP 122/72 mmHg  Pulse 96  Temp(Src) 98.6 F (37 C) (Oral)  Resp 17  Ht 5' 3.5" (1.613 m)  Wt 123 lb (55.792 kg)  BMI 21.44 kg/m2  SpO2 98%    Assessment & Plan:  I have completed the patient encounter in its entirety as documented by the scribe, with editing by me where necessary. Napolean Sia P. Laney Pastor, M.D. Loss of weight - Plan: POCT CBC, Basic metabolic panel  Looks like GI /nutrition feel this is not pathological/if her labs are normal will wait for another 3 or 4 pounds of weight loss before further  investigation  Bilateral calf pain - Plan: POCT SEDIMENTATION RATE=== mid 30s as it has been for the last several years  Uncertain etiology. She is advised to start with stretching and walking and using heat applications or hot to if  desired.  Ecchymosis--reassured this should resolve over the next 2 weeks  Insomnia--she is advised to no longer  take naps//regular sleep cycle//regular exercise cycle//improve diet per nutritionist  Chronic fatigue -hopefully this will improve with all of the above   [facility] Results for orders placed or performed in visit on 97/35/32  Basic metabolic panel  Result Value Ref Range   Sodium 142 135 - 146 mEq/L   Potassium 4.1 3.5 - 5.3 mEq/L   Chloride 105 98 - 110 mEq/L   CO2 29 20 - 31 mEq/L   Glucose, Bld 93 65 - 99 mg/dL   BUN 12 7 - 25 mg/dL   Creat 0.85 0.50 - 0.99 mg/dL   Calcium 9.4 8.6 - 10.4 mg/dL  POCT CBC  Result Value Ref Range   WBC 7.0 4.6 - 10.2 K/uL   Lymph, poc 2.0 0.6 - 3.4   POC LYMPH PERCENT 29.1 10 - 50 %L   MID (cbc) 0.4 0 - 0.9   POC MID % 6.0 0 - 12 %M   POC Granulocyte 4.5 2 - 6.9   Granulocyte percent 64.9 37 - 80 %G   RBC 4.22 4.04 - 5.48 M/uL   Hemoglobin 12.7 12.2 - 16.2 g/dL   HCT, POC 38.8 37.7 - 47.9 %   MCV 92.0 80 - 97 fL   MCH, POC 30.2 27 - 31.2 pg   MCHC 32.8 31.8 - 35.4 g/dL   RDW, POC 13.2 %   Platelet Count, POC 211 142 - 424 K/uL   MPV 7.1 0 - 99.8 fL  POCT SEDIMENTATION RATE  Result Value Ref Range   POCT SED RATE 32 (A) 0 - 22 mm/hr   sedimentation rate has been this level for a few years now

## 2014-08-15 LAB — BASIC METABOLIC PANEL WITH GFR
BUN: 12 mg/dL (ref 7–25)
CO2: 29 meq/L (ref 20–31)
Calcium: 9.4 mg/dL (ref 8.6–10.4)
Chloride: 105 meq/L (ref 98–110)
Creat: 0.85 mg/dL (ref 0.50–0.99)
Glucose, Bld: 93 mg/dL (ref 65–99)
Potassium: 4.1 meq/L (ref 3.5–5.3)
Sodium: 142 meq/L (ref 135–146)

## 2014-08-20 DIAGNOSIS — D2271 Melanocytic nevi of right lower limb, including hip: Secondary | ICD-10-CM | POA: Diagnosis not present

## 2014-08-22 ENCOUNTER — Ambulatory Visit (INDEPENDENT_AMBULATORY_CARE_PROVIDER_SITE_OTHER): Payer: Medicare Other | Admitting: Emergency Medicine

## 2014-08-22 ENCOUNTER — Ambulatory Visit (INDEPENDENT_AMBULATORY_CARE_PROVIDER_SITE_OTHER): Payer: Medicare Other

## 2014-08-22 VITALS — BP 114/68 | HR 69 | Temp 97.6°F | Resp 18 | Ht 63.5 in | Wt 122.0 lb

## 2014-08-22 DIAGNOSIS — R42 Dizziness and giddiness: Secondary | ICD-10-CM

## 2014-08-22 DIAGNOSIS — M79661 Pain in right lower leg: Secondary | ICD-10-CM

## 2014-08-22 NOTE — Progress Notes (Addendum)
Patient ID: Mary Rubio, female   DOB: [birth year], 69 y.o.   MRN: [id number]    This chart was scribed for Nena Jordan, MD by [facility], medical scribe at [facility].The patient was seen in exam room 04 and the patient's care was started at 8:50 AM.  Chief Complaint:  Chief Complaint  Patient presents with  . blood clot rt ankle    lump under mole, was 7/26  . Leg Pain  . Dizziness   HPI: Mary Rubio is a 69 y.o. female who reports to [facility] today complaining of right leg pain for the past eight days. Initially she noticed a mole on her right ankle. The area is very tender and painful. Aching at night, and pain with bearing weight. Initially seen by  Dr. Laney Pastor and referred to a Dermatologist to biopsy the mole, but he was concerned it maybe a blood clot. No known injury or trauma to her right leg.  Pt also complains of dizziness for the past two days. She is unsure if she is drinking enough water. Her blood pressure has been lower than usual. Associated fatigue, causing her to lay down throughout the day.  Past Medical History  Diagnosis Date  . Environmental allergies     Weekly allergy shots  . Depression   . GERD (gastroesophageal reflux disease)   . Hypothyroidism   . Anxiety   . Hyperlipidemia   . Hiatal hernia   . Asthma   . IBS (irritable bowel syndrome)   . Diverticulosis   . Colon polyps   . Osteoporosis   . Allergy   . Osteopenia   . Esophageal stricture    Past Surgical History  Procedure Laterality Date  . Abdominal hysterectomy    . Cholecystectomy    . Bunionectomy Right   . Thumb surgery Left     for cyst  . Exploratory laparotomy      endometriosis  . Bartholin gland cyst excision    . Facial cyst      excision  . Esophagogastroduodenoscopy N/A 10/21/2012    Procedure: ESOPHAGOGASTRODUODENOSCOPY (EGD);  Surgeon: Beryle Beams, MD;  Location: [facility];  Service: Endoscopy;  Laterality: N/A;  . Nasal septum  surgery     History   Social History  . Marital Status: Single    Spouse Name: N/A  . Number of Children: 1  . Years of Education: college   Occupational History  . caregiver    Social History Main Topics  . Smoking status: Never Smoker   . Smokeless tobacco: Never Used  . Alcohol Use: No  . Drug Use: No  . Sexual Activity: No   Other Topics Concern  . None   Social History Narrative   Family History  Problem Relation Age of Onset  . Lung cancer Mother   . Hypertension Mother   . Skin cancer Father   . Dementia Father   . Heart disease Sister   . Thyroid disease Daughter   . Heart disease Maternal Grandfather   . Heart disease Paternal Grandmother   . Thyroid disease Mother   . Arrhythmia Father   . CAD Sister     MI/stent age 24, CABG age 18  . Heart disease Father     Several relatives on dad's side have had MIs/CABG  . Heart disease Mother   . Thyroid disease Sister    Allergies  Allergen Reactions  . Augmentin [Amoxicillin-Pot Clavulanate] Other (See Comments)  Stomach pain   . Crestor [Rosuvastatin]     Myalgias  . Demerol [Meperidine]   . Latex Other (See Comments)    Canker sores in mouth (discovered by dentist)  . Meperidine Hcl Other (See Comments)    Does not remember   . Morphine And Related Itching    Has tolerated small doses of hydrocodone  . Pentazocine Lactate Nausea And Vomiting    Sick on stomach  . Simvastatin Other (See Comments)    Muscle aches   . Talwin [Pentazocine]   . Codeine Rash    Has tolerated small doses of hydrocodone  . Morphine Rash   Prior to Admission medications   Medication Sig Start Date End Date Taking? Authorizing Provider  albuterol (PROVENTIL HFA;VENTOLIN HFA) 108 (90 BASE) MCG/ACT inhaler Inhale 2 puffs into the lungs every 4 (four) hours as needed for wheezing or shortness of breath (cough, shortness of breath or wheezing.). 02/06/14  Yes Roselee Culver, MD  Polyethyl Glycol-Propyl Glycol (SYSTANE  OP) Place 1 drop into both eyes daily.   Yes Historical Provider, MD  ranitidine (ZANTAC) 300 MG tablet Take 1 tablet (300 mg total) by mouth 2 (two) times daily. Patient taking differently: Take 450 mg by mouth 2 (two) times daily.  05/21/14  Yes Amy S Esterwood, PA-C  SYNTHROID 100 MCG tablet Take 1 tablet (100 mcg total) by mouth daily before breakfast. 01/23/14  Yes Philemon Kingdom, MD  Vitamin D, Ergocalciferol, (DRISDOL) 50000 UNITS CAPS capsule Take 4,000 Units by mouth every 7 (seven) days.   Yes Historical Provider, MD     ROS: The patient denies fevers, chills, night sweats, unintentional weight loss, chest pain, palpitations, wheezing, dyspnea on exertion, nausea, vomiting, abdominal pain, dysuria, hematuria, melena, numbness, weakness, or tingling.   All other systems have been reviewed and were otherwise negative with the exception of those mentioned in the HPI and as above.    PHYSICAL EXAM: Filed Vitals:   08/22/14 0826  BP: 114/68  Pulse: 69  Temp: 97.6 F (36.4 C)  Resp: 18   Body mass index is 21.27 kg/(m^2).  General: Alert, no acute distress HEENT:  Normocephalic, atraumatic, oropharynx patent. Eye: Juliette Mangle [facility] Cardiovascular:  Regular rate and rhythm, no rubs murmurs or gallops.  No Carotid bruits, radial pulse intact. No pedal edema.  Respiratory: Clear to auscultation bilaterally.  No wheezes, rales, or rhonchi.  No cyanosis, no use of accessory musculature Musculoskeletal: Gait intact. No edema, tenderness Skin: No rashes. There is a 1/2 x 1/2 cm mole just above the lateral malleolus. There is tenderness just distal to mole over the distal tibia.  Neurologic: Facial musculature symmetric. Psychiatric: Patient acts appropriately throughout our interaction.  LABS: Results for orders placed or performed in visit on 40/98/11  Basic metabolic panel  Result Value Ref Range   Sodium 142 135 - 146 mEq/L   Potassium 4.1 3.5 - 5.3 mEq/L   Chloride 105 98 - 110  mEq/L   CO2 29 20 - 31 mEq/L   Glucose, Bld 93 65 - 99 mg/dL   BUN 12 7 - 25 mg/dL   Creat 0.85 0.50 - 0.99 mg/dL   Calcium 9.4 8.6 - 10.4 mg/dL  POCT CBC  Result Value Ref Range   WBC 7.0 4.6 - 10.2 K/uL   Lymph, poc 2.0 0.6 - 3.4   POC LYMPH PERCENT 29.1 10 - 50 %L   MID (cbc) 0.4 0 - 0.9   POC MID % 6.0 0 -  12 %M   POC Granulocyte 4.5 2 - 6.9   Granulocyte percent 64.9 37 - 80 %G   RBC 4.22 4.04 - 5.48 M/uL   Hemoglobin 12.7 12.2 - 16.2 g/dL   HCT, POC 38.8 37.7 - 47.9 %   MCV 92.0 80 - 97 fL   MCH, POC 30.2 27 - 31.2 pg   MCHC 32.8 31.8 - 35.4 g/dL   RDW, POC 13.2 %   Platelet Count, POC 211 142 - 424 K/uL   MPV 7.1 0 - 99.8 fL  POCT SEDIMENTATION RATE  Result Value Ref Range   POCT SED RATE 32 (A) 0 - 22 mm/hr   EKG/XRAY:   Primary read interpreted by Dr. Everlene Farrier at [facility]. Distal tibia looks normal. There is a screw present in the tarsal bones of the left foot. Which originates at the base of the first metatarsal EKG normal sinus rhythm.  Orthostatic VS for the past 24 hrs:  BP- Lying Pulse- Lying BP- Standing at 0 minutes Pulse- Standing at 0 minutes  08/22/14 0910 134/81 mmHg 69 147/81 mmHg 80      ASSESSMENT/PLAN: She was advised she could take an anti-inflammatory. I have scheduled her for a Doppler of the leg. The x-rays of that area are completely normal. I suspect she had superficial phlebitis with inflammation which appears to be resolving.  Gross sideeffects, risk and benefits, and alternatives of medications d/w patient. Patient is aware that all medications have potential sideeffects and we are unable to predict every sideeffect or drug-drug interaction that may occur.    Arlyss Queen MD 08/22/2014 8:44 AM     There is a screw present through the base of the first metatarsal into the tarsal bone

## 2014-08-22 NOTE — Patient Instructions (Signed)
We are going to schedule you for a Doppler study. You can take either ibuprofen or Aleve for your discomfort. B sure you drink good quantities of fluids during the day to help with your blood pressure.

## 2014-08-27 ENCOUNTER — Telehealth: Payer: Self-pay

## 2014-08-27 DIAGNOSIS — M79604 Pain in right leg: Secondary | ICD-10-CM

## 2014-08-27 NOTE — Telephone Encounter (Signed)
Pt is asking about a doppler and referrals does not see anything in chart  Please call [phone]

## 2014-08-28 NOTE — Telephone Encounter (Signed)
Referral placed. Referral can can you work on this? Advised pt.

## 2014-08-29 ENCOUNTER — Ambulatory Visit (HOSPITAL_COMMUNITY)
Admission: RE | Admit: 2014-08-29 | Discharge: 2014-08-29 | Disposition: A | Discharge status: Home / Self Care | Payer: Medicare Other | Source: Ambulatory Visit | Attending: Emergency Medicine | Admitting: Emergency Medicine

## 2014-08-29 DIAGNOSIS — M79604 Pain in right leg: Secondary | ICD-10-CM | POA: Diagnosis not present

## 2014-08-29 DIAGNOSIS — R2241 Localized swelling, mass and lump, right lower limb: Secondary | ICD-10-CM | POA: Insufficient documentation

## 2014-08-29 NOTE — Telephone Encounter (Signed)
I recommend she continue with the plan for anti-inflammatory medications for her right lower leg pain especially since her work up was negative for DVT. Dr. Everlene Farrier suspected this was superficial thrombophlebitis which should resolve without aggressive intervention. If her leg pain has worsened despite these measures, she should rtc for re-evaluation.

## 2014-08-29 NOTE — Telephone Encounter (Signed)
[facility] called to report doppler on right leg was neg for DVT. She has let the pt know. I asked her to let the pt go, and advise that we will be back in touch with her if we have any further instr's. Dr Everlene Farrier, please advise if any further info needed to convey to pt.

## 2014-08-29 NOTE — Progress Notes (Signed)
Preliminary report by tech - Right Lower Ext. Venous Duplex Completed. Negative for deep and superficial vein thrombosis in the right lower extremity. Dr. Perfecto Kingdom office was called with these results. Oda Cogan, BS, RDMS, RVT

## 2014-08-30 NOTE — Telephone Encounter (Signed)
Spoke with pt, advised message from Mani. Pt understood. 

## 2014-10-25 DIAGNOSIS — M793 Panniculitis, unspecified: Secondary | ICD-10-CM | POA: Diagnosis not present

## 2014-10-25 DIAGNOSIS — D224 Melanocytic nevi of scalp and neck: Secondary | ICD-10-CM | POA: Diagnosis not present

## 2014-10-25 DIAGNOSIS — L280 Lichen simplex chronicus: Secondary | ICD-10-CM | POA: Diagnosis not present

## 2014-10-30 DIAGNOSIS — H1045 Other chronic allergic conjunctivitis: Secondary | ICD-10-CM | POA: Diagnosis not present

## 2014-10-30 DIAGNOSIS — J3089 Other allergic rhinitis: Secondary | ICD-10-CM | POA: Diagnosis not present

## 2014-10-30 DIAGNOSIS — R0602 Shortness of breath: Secondary | ICD-10-CM | POA: Diagnosis not present

## 2014-10-30 DIAGNOSIS — J301 Allergic rhinitis due to pollen: Secondary | ICD-10-CM | POA: Diagnosis not present

## 2014-11-28 ENCOUNTER — Encounter: Payer: Self-pay | Admitting: Internal Medicine

## 2014-11-28 ENCOUNTER — Ambulatory Visit (INDEPENDENT_AMBULATORY_CARE_PROVIDER_SITE_OTHER): Payer: Medicare Other | Admitting: Internal Medicine

## 2014-11-28 VITALS — BP 121/75 | HR 70 | Temp 97.9°F | Resp 16 | Ht 63.0 in | Wt 120.0 lb

## 2014-11-28 DIAGNOSIS — R300 Dysuria: Secondary | ICD-10-CM

## 2014-11-28 DIAGNOSIS — Z0000 Encounter for general adult medical examination without abnormal findings: Secondary | ICD-10-CM

## 2014-11-28 DIAGNOSIS — R1084 Generalized abdominal pain: Secondary | ICD-10-CM

## 2014-11-28 DIAGNOSIS — E785 Hyperlipidemia, unspecified: Secondary | ICD-10-CM

## 2014-11-28 DIAGNOSIS — E039 Hypothyroidism, unspecified: Secondary | ICD-10-CM

## 2014-11-28 LAB — POCT URINALYSIS DIP (MANUAL ENTRY)
Bilirubin, UA: NEGATIVE
Glucose, UA: NEGATIVE
Ketones, POC UA: NEGATIVE
Leukocytes, UA: NEGATIVE
Nitrite, UA: NEGATIVE
Protein Ur, POC: NEGATIVE
Spec Grav, UA: 1.015
Urobilinogen, UA: 0.2
pH, UA: 7

## 2014-11-28 LAB — POC MICROSCOPIC URINALYSIS (UMFC): Mucus: ABSENT

## 2014-11-28 LAB — CBC WITH DIFFERENTIAL/PLATELET
Basophils Absolute: 0.1 10*3/uL (ref 0.0–0.1)
Basophils Relative: 1 % (ref 0–1)
Eosinophils Absolute: 0.2 10*3/uL (ref 0.0–0.7)
Eosinophils Relative: 3 % (ref 0–5)
HCT: 38 % (ref 36.0–46.0)
Hemoglobin: 13.1 g/dL (ref 12.0–15.0)
Lymphocytes Relative: 37 % (ref 12–46)
Lymphs Abs: 2.1 10*3/uL (ref 0.7–4.0)
MCH: 30.8 pg (ref 26.0–34.0)
MCHC: 34.5 g/dL (ref 30.0–36.0)
MCV: 89.4 fL (ref 78.0–100.0)
MPV: 9.6 fL (ref 8.6–12.4)
Monocytes Absolute: 0.5 10*3/uL (ref 0.1–1.0)
Monocytes Relative: 9 % (ref 3–12)
Neutro Abs: 2.8 10*3/uL (ref 1.7–7.7)
Neutrophils Relative %: 50 % (ref 43–77)
Platelets: 225 10*3/uL (ref 150–400)
RBC: 4.25 MIL/uL (ref 3.87–5.11)
RDW: 13 % (ref 11.5–15.5)
WBC: 5.6 10*3/uL (ref 4.0–10.5)

## 2014-11-28 LAB — T3, FREE: T3, Free: 2.8 pg/mL (ref 2.3–4.2)

## 2014-11-28 LAB — T4, FREE: Free T4: 1.09 ng/dL (ref 0.80–1.80)

## 2014-11-28 LAB — TSH: TSH: 6.675 u[IU]/mL — ABNORMAL HIGH (ref 0.350–4.500)

## 2014-11-28 MED ORDER — CLONAZEPAM 0.125 MG PO TBDP: ORAL_TABLET | ORAL | Status: DC

## 2014-11-28 NOTE — Progress Notes (Signed)
Subjective:    Patient ID: Mary Rubio, female    DOB: 1946-01-13, 69 y.o.   MRN: [id number]  [facility] medicare Patient Active Problem List   Diagnosis Date Noted  . Osteopenia--vitd/ca 07/24/2014  . Depression---see hx//this is no longer an issue. She has significant anxiety in her life care of her father but is now optimistic. Since retirement she is very active in church affairs has a good friends group. her anxiety does not prevent her from any daily activities.  06/11/2014  . Vaginal lesion--gyn/bx neg 04/12/2013  . Dysphagia 05/26/2012  . Esophageal reflux 05/26/2012  . Diaphragmatic hernia 05/26/2012  . Hypothyroidism--Dr Cruzita Lederer 08/14/2007  . HYPERLIPIDEMIA--off meds 08/14/2007  . ALLERGIC RHINITIS w/ RAD 08/14/2007  meds-Synthroid 100 g, when necessary albuterol. Ranitidine 300 mg.  Continues with GI problems. Recent endoscopy and colonoscopy did not discover an answer for her chronic gastrointestinal problems with abdominal pain after eating, diarrhea, loss of appetite, reflux. Her allergist [place] with No she has eosinophilia in the esophagus at the biopsy did not reveal this. This is the third gastroenterologist to evaluate her in the last decade. She is concerned about recent weight loss and is starting to eliminate many foods which she says upset her stomach.  The main thing it occupies her life since retirement is caring for her father advancing Alzheimer's. He has gotten worse over the last 2 weeks and no longer recognizes she and her sister but they still persistin caring for him. Tremendous stress. She is sleeping better by taking 1/4 of 0.25 clonazepam.  Depression screen [facility] 2/9 11/28/2014  Decreased Interest 0  Down, Depressed, Hopeless 0  PHQ - 2 Score 0  Altered sleeping -  Tired, decreased energy -  Change in appetite -  Feeling bad or failure about yourself  -  Trouble concentrating -  Moving slowly or fidgety/restless -  Suicidal thoughts -    PHQ-9 Score -  Difficult doing work/chores -   All health maintenance issues are up-to-date  Review of Systems  Constitutional: Negative for fever, appetite change, fatigue and unexpected weight change.  HENT: Negative for postnasal drip and tinnitus.   Eyes: Positive for photophobia.  Respiratory: Positive for cough.   Cardiovascular: Positive for palpitations.  Gastrointestinal: Positive for nausea, abdominal pain and diarrhea.  Endocrine: Positive for cold intolerance.  Genitourinary: Positive for flank pain.  Musculoskeletal: Positive for myalgias, back pain and arthralgias.  Allergic/Immunologic: Positive for environmental allergies and food allergies.  Neurological: Positive for light-headedness.  Psychiatric/Behavioral: Positive for sleep disturbance. The patient is nervous/anxious.   rest negative None new--most concerning is burn at introi w/ urine Last pap (hyst)wnl-see ref bx neg vag les    Objective:   Physical Exam  Constitutional: She is oriented to person, place, and time. She appears well-developed and well-nourished. No distress.  HENT:  Head: Normocephalic.  Right Ear: External ear normal.  Left Ear: External ear normal.  Nose: Nose normal.  Mouth/Throat: Oropharynx is clear and moist.  Eyes: Conjunctivae and EOM are normal. Pupils are equal, round, and reactive to light.  Neck: Normal range of motion. Neck supple. No thyromegaly present.  Cardiovascular: Normal rate, regular rhythm, normal heart sounds and intact distal pulses.   No murmur heard. Pulmonary/Chest: Effort normal and breath sounds normal. She has no wheezes.  Abdominal: Soft. Bowel sounds are normal. She exhibits no distension and no mass. There is no tenderness. There is no rebound.  Genitourinary:  Dry skin irritation at 6oclock introitus without lesion  Musculoskeletal: Normal range of motion. She exhibits no edema or tenderness.  Lymphadenopathy:    She has no cervical adenopathy.   Neurological: She is alert and oriented to person, place, and time. She has normal reflexes. No cranial nerve deficit.  Skin: Skin is warm and dry. No rash noted.  Psychiatric: She has a normal mood and affect. Her behavior is normal. Judgment and thought content normal.  Nursing note and vitals reviewed. BP 121/75 mmHg  Pulse 70  Temp(Src) 97.9 F (36.6 C)  Resp 16  Ht _0  (1.6 m)  Wt 120 lb (54.432 kg)  BMI 21.26 kg/m2    Wt Readings from Last 3 Encounters:  11/28/14 120 lb (54.432 kg)  08/22/14 122 lb (55.339 kg)  08/14/14 123 lb (55.792 kg)  12/15  136# Despite this weight loss she appears very healthy Assessment & Plan:  Medicare annual wellness visit, subsequent  Dysuria - Plan: POCT Microscopic Urinalysis ([facility]), POCT urinalysis dipstick -She has slight blood with few red blood cells in the urine this has been a common feature over the last 2-3 years area I will offer urology evaluation if she chooses but I think her dysuria comes from local skin irritation due to vaginal dryness.  Hypothyroidism, unspecified hypothyroidism type - Plan: TSH, T4, free, T3, Free-follow-up with endocrinology  Generalized abdominal pain - Plan: CBC with Differential/Platelet, Comprehensive metabolic panel, Sedimentation rate -She has been thoroughly evaluated by GI without answers other than a hiatal hernia. This may be related to her anxiety. There have been many times over the past decade where IBS prominent  Hyperlipidemia - Plan: Lipid panel  Results for orders placed or performed in visit on 11/28/14  CBC with Differential/Platelet  Result Value Ref Range   WBC 5.6 4.0 - 10.5 K/uL   RBC 4.25 3.87 - 5.11 MIL/uL   Hemoglobin 13.1 12.0 - 15.0 g/dL   HCT 38.0 36.0 - 46.0 %   MCV 89.4 78.0 - 100.0 fL   MCH 30.8 26.0 - 34.0 pg   MCHC 34.5 30.0 - 36.0 g/dL   RDW 13.0 11.5 - 15.5 %   Platelets 225 150 - 400 K/uL   MPV 9.6 8.6 - 12.4 fL   Neutrophils Relative % 50 43 - 77 %   Neutro  Abs 2.8 1.7 - 7.7 K/uL   Lymphocytes Relative 37 12 - 46 %   Lymphs Abs 2.1 0.7 - 4.0 K/uL   Monocytes Relative 9 3 - 12 %   Monocytes Absolute 0.5 0.1 - 1.0 K/uL   Eosinophils Relative 3 0 - 5 %   Eosinophils Absolute 0.2 0.0 - 0.7 K/uL   Basophils Relative 1 0 - 1 %   Basophils Absolute 0.1 0.0 - 0.1 K/uL   Smear Review Criteria for review not met   Comprehensive metabolic panel  Result Value Ref Range   Sodium 141 135 - 146 mmol/L   Potassium 4.1 3.5 - 5.3 mmol/L   Chloride 104 98 - 110 mmol/L   CO2 24 20 - 31 mmol/L   Glucose, Bld 79 65 - 99 mg/dL   BUN 10 7 - 25 mg/dL   Creat 0.81 0.50 - 0.99 mg/dL   Total Bilirubin 0.5 0.2 - 1.2 mg/dL   Alkaline Phosphatase 62 33 - 130 U/L   AST 18 10 - 35 U/L   ALT 11 6 - 29 U/L   Total Protein 7.0 6.1 - 8.1 g/dL   Albumin 4.1 3.6 - 5.1 g/dL  Calcium 9.2 8.6 - 10.4 mg/dL  Lipid panel  Result Value Ref Range   Cholesterol 202 (H) 125 - 200 mg/dL   Triglycerides 96 <150 mg/dL   HDL 62 >=46 mg/dL   Total CHOL/HDL Ratio 3.3 <=5.0 Ratio   VLDL 19 <30 mg/dL   LDL Cholesterol 121---no need for meds <130 mg/dL  TSH  Result Value Ref Range   TSH 6.675 (H) 0.350 - 4.500 uIU/mL  T4, free  Result Value Ref Range   Free T4 1.09 0.80 - 1.80 ng/dL  T3, Free  Result Value Ref Range   T3, Free 2.8 2.3 - 4.2 pg/mL  Sedimentation rate  Result Value Ref Range   Sed Rate 11 0 - 30 mm/hr  POCT Microscopic Urinalysis ([facility])  Result Value Ref Range   WBC,UR,HPF,POC None None WBC/hpf   RBC,UR,HPF,POC Few (A)--?urol None RBC/hpf   Bacteria None None, Too numerous to count   Mucus Absent Absent   Epithelial Cells, UR Per Microscopy None None, Too numerous to count cells/hpf  POCT urinalysis dipstick  Result Value Ref Range   Color, UA light yellow (A) yellow   Clarity, UA clear clear   Glucose, UA negative negative   Bilirubin, UA negative negative   Ketones, POC UA negative negative   Spec Grav, UA 1.015    Blood, UA trace-intact (A)  negative   pH, UA 7.0    Protein Ur, POC negative negative   Urobilinogen, UA 0.2    Nitrite, UA Negative Negative   Leukocytes, UA Negative Negative

## 2014-11-29 LAB — COMPREHENSIVE METABOLIC PANEL
ALT: 11 U/L (ref 6–29)
AST: 18 U/L (ref 10–35)
Albumin: 4.1 g/dL (ref 3.6–5.1)
Alkaline Phosphatase: 62 U/L (ref 33–130)
BUN: 10 mg/dL (ref 7–25)
CO2: 24 mmol/L (ref 20–31)
Calcium: 9.2 mg/dL (ref 8.6–10.4)
Chloride: 104 mmol/L (ref 98–110)
Creat: 0.81 mg/dL (ref 0.50–0.99)
Glucose, Bld: 79 mg/dL (ref 65–99)
Potassium: 4.1 mmol/L (ref 3.5–5.3)
Sodium: 141 mmol/L (ref 135–146)
Total Bilirubin: 0.5 mg/dL (ref 0.2–1.2)
Total Protein: 7 g/dL (ref 6.1–8.1)

## 2014-11-29 LAB — LIPID PANEL
Cholesterol: 202 mg/dL — ABNORMAL HIGH (ref 125–200)
HDL: 62 mg/dL (ref >=46)
LDL Cholesterol: 121 mg/dL (ref <130)
Total CHOL/HDL Ratio: 3.3 Ratio (ref <=5.0)
Triglycerides: 96 mg/dL (ref <150)
VLDL: 19 mg/dL (ref <30)

## 2014-11-29 LAB — SEDIMENTATION RATE: Sed Rate: 11 mm/hr (ref 0–30)

## 2014-12-17 ENCOUNTER — Encounter: Payer: Self-pay | Admitting: Internal Medicine

## 2015-01-09 ENCOUNTER — Other Ambulatory Visit: Payer: Self-pay | Admitting: Internal Medicine

## 2015-01-10 ENCOUNTER — Other Ambulatory Visit: Payer: Self-pay | Admitting: Internal Medicine

## 2015-01-10 ENCOUNTER — Other Ambulatory Visit: Payer: Self-pay | Admitting: *Deleted

## 2015-01-10 MED ORDER — SYNTHROID 100 MCG PO TABS: 100.0000 ug | ORAL_TABLET | Freq: Every day | ORAL | Status: DC

## 2015-01-16 DIAGNOSIS — H40023 Open angle with borderline findings, high risk, bilateral: Secondary | ICD-10-CM | POA: Diagnosis not present

## 2015-01-23 DIAGNOSIS — Z0000 Encounter for general adult medical examination without abnormal findings: Secondary | ICD-10-CM | POA: Diagnosis not present

## 2015-01-23 DIAGNOSIS — R350 Frequency of micturition: Secondary | ICD-10-CM | POA: Diagnosis not present

## 2015-01-23 DIAGNOSIS — R3121 Asymptomatic microscopic hematuria: Secondary | ICD-10-CM | POA: Diagnosis not present

## 2015-01-24 ENCOUNTER — Ambulatory Visit (INDEPENDENT_AMBULATORY_CARE_PROVIDER_SITE_OTHER): Payer: Medicare Other | Admitting: Internal Medicine

## 2015-01-24 ENCOUNTER — Encounter: Payer: Self-pay | Admitting: Internal Medicine

## 2015-01-24 VITALS — BP 118/64 | HR 82 | Temp 97.7°F | Resp 12 | Wt 120.4 lb

## 2015-01-24 DIAGNOSIS — E559 Vitamin D deficiency, unspecified: Secondary | ICD-10-CM

## 2015-01-24 DIAGNOSIS — E039 Hypothyroidism, unspecified: Secondary | ICD-10-CM

## 2015-01-24 DIAGNOSIS — M8580 Other specified disorders of bone density and structure, unspecified site: Secondary | ICD-10-CM | POA: Diagnosis not present

## 2015-01-24 NOTE — Progress Notes (Signed)
Patient ID: Mary Rubio, female   DOB: 1946-01-20, 70 y.o.   MRN: [id number]   HPI  Mary Rubio is a 70 y.o.-year-old female, retruning for f/u for hypothyroidism, dx 1996. Last visit 6 mo ago.  She saw nutrition and also GI >> small bowel inflammation and strictures >> since then, she changed her diet at the nutritionist advice >> less acid foods >> feels much better ("the new diet changed my life").  Pt. is on Synthroid DAW 100 mcg, taken: - fasting - with water - no b'fast  - + calcium with lunch if taking - + Ranitine with lunch, but if increased acid >> takes Ranitidine in am and Synthroid at night then - no iron, multivitamins   I reviewed pt's thyroid tests: Lab Results  Component Value Date   TSH 6.675* 11/28/2014   TSH 2.36 06/29/2014   TSH 1.84 02/27/2014   TSH 0.948 01/22/2014   TSH 0.68 10/19/2013   TSH 0.49 08/31/2013   TSH 4.728* 03/08/2013   TSH 9.188* 12/28/2012   TSH 0.299* 09/18/2012   TSH 2.096 04/27/2012   FREET4 1.09 11/28/2014   FREET4 0.85 06/29/2014   FREET4 0.99 02/27/2014   FREET4 1.86* 01/22/2014   FREET4 1.10 10/19/2013   FREET4 1.00 08/31/2013   FREET4 1.26 03/08/2013   FREET4 1.35 04/27/2012  08/17/2013: TSH 3.45, fT4 0.70 >> she was advised to increase the LT4 to 125 mcg, but she did not do it. She feel better on  112 mcg.  07/04/2013: TSH 6.81 >> dose of LT4 increased from 100 to 112 mcg  Pt describes: - + fatigue  - no palpitations - resolved weight loss  - no  cold intolerance - resolved N/V/reflux/diarrhea - resolved hair loss  Pt denies feeling nodules in neck, hoarseness, dysphagia/odynophagia, SOB with lying down.  She had a h/o radiation tx to head or neck (for ear infection >> experimental tx - several tx's)  ROS: Constitutional: see HPI, + poor sleep Eyes: no blurry vision, no xerophthalmia ENT: no sore throat, no nodules palpated in throat, no dysphagia/odynophagia Cardiovascular: no CP/SOB/palpitations/no  leg swelling Respiratory: no cough/no SOB Gastrointestinal: no N/V/D/C, + heartburn Musculoskeletal: + muscle aches/+ joint aches Skin: no rashes,no hair loss Neurological: no tremors/numbness/tingling/dizziness, + HA  I reviewed pt's medications, allergies, PMH, social hx, family hx, and changes were documented in the history of present illness. Otherwise, unchanged from my initial visit note: Past Medical History  Diagnosis Date  . Environmental allergies     Weekly allergy shots  . Depression   . GERD (gastroesophageal reflux disease)   . Hypothyroidism   . Anxiety   . Hyperlipidemia   . Hiatal hernia   . Asthma   . IBS (irritable bowel syndrome)   . Diverticulosis   . Colon polyps   . Osteoporosis   . Allergy   . Osteopenia   . Esophageal stricture    Past Surgical History  Procedure Laterality Date  . Abdominal hysterectomy    . Cholecystectomy    . Bunionectomy Right   . Thumb surgery Left     for cyst  . Exploratory laparotomy      endometriosis  . Bartholin gland cyst excision    . Facial cyst      excision  . Esophagogastroduodenoscopy N/A 10/21/2012    Procedure: ESOPHAGOGASTRODUODENOSCOPY (EGD);  Surgeon: Beryle Beams, MD;  Location: [facility];  Service: Endoscopy;  Laterality: N/A;  . Nasal septum surgery     History  Social History  . Marital Status: Single    Spouse Name: N/A    Number of Children: 1  . Years of Education: college   Occupational History  . caregiver    Social History Main Topics  . Smoking status: Never Smoker   . Smokeless tobacco: No  . Alcohol Use: No  . Drug Use: No   Current Outpatient Prescriptions on File Prior to Visit  Medication Sig Dispense Refill  . albuterol (PROVENTIL HFA;VENTOLIN HFA) 108 (90 BASE) MCG/ACT inhaler Inhale 2 puffs into the lungs every 4 (four) hours as needed for wheezing or shortness of breath (cough, shortness of breath or wheezing.). 1 Inhaler 12  . clonazepam (KLONOPIN) 0.125 MG  disintegrating tablet 1/2-1 at bedtime 30 tablet 5  . Polyethyl Glycol-Propyl Glycol (SYSTANE OP) Place 1 drop into both eyes daily.    . ranitidine (ZANTAC) 300 MG tablet Take 1 tablet (300 mg total) by mouth 2 (two) times daily. (Patient taking differently: Take 450 mg by mouth 2 (two) times daily. ) 60 tablet 6  . SYNTHROID 100 MCG tablet Take 1 tablet (100 mcg total) by mouth daily before breakfast. 60 tablet 0  . Vitamin D, Ergocalciferol, (DRISDOL) 50000 UNITS CAPS capsule Take 4,000 Units by mouth every 7 (seven) days.     No current facility-administered medications on file prior to visit.   Allergies  Allergen Reactions  . Augmentin [Amoxicillin-Pot Clavulanate] Other (See Comments)    Stomach pain   . Crestor [Rosuvastatin]     Myalgias  . Demerol [Meperidine]   . Latex Other (See Comments)    Canker sores in mouth (discovered by dentist)  . Meperidine Hcl Other (See Comments)    Does not remember   . Morphine And Related Itching    Has tolerated small doses of hydrocodone  . Pentazocine Lactate Nausea And Vomiting    Sick on stomach  . Simvastatin Other (See Comments)    Muscle aches   . Talwin [Pentazocine]   . Codeine Rash    Has tolerated small doses of hydrocodone  . Morphine Rash   Family History  Problem Relation Age of Onset  . Lung cancer Mother   . Hypertension Mother   . Skin cancer Father   . Dementia Father   . Heart disease Sister   . Thyroid disease Daughter   . Heart disease Maternal Grandfather   . Heart disease Paternal Grandmother   . Thyroid disease Mother   . Arrhythmia Father   . CAD Sister     MI/stent age 76, CABG age 1  . Heart disease Father     Several relatives on dad's side have had MIs/CABG  . Heart disease Mother   . Thyroid disease Sister    PE: BP 118/64 mmHg  Pulse 82  Temp(Src) 97.7 F (36.5 C) (Oral)  Resp 12  Wt 120 lb 6.4 oz (54.613 kg)  SpO2 96% Body mass index is 21.33 kg/(m^2). Wt Readings from Last 3  Encounters:  01/24/15 120 lb 6.4 oz (54.613 kg)  11/28/14 120 lb (54.432 kg)  08/22/14 122 lb (55.339 kg)   Constitutional: normal weight, in NAD Eyes: PERRLA, EOMI, no exophthalmos ENT: moist mucous membranes, no thyromegaly, no cervical lymphadenopathy Cardiovascular: RRR, No MRG Respiratory: CTA B Gastrointestinal: abdomen soft, NT, ND, BS+ Musculoskeletal: no deformities, strength intact in all 4 Skin: moist, warm, no rashes Neurological: no tremor with outstretched hands, DTR normal in all 4  ASSESSMENT: 1. Hypothyroidism - Thyroid U/S (03/17/2013) -  a nonenlarged thyroid, with very small hypoechoic regions:  Right lobe: 4.2 x 1.5 x 1.2 cm. There are multiple hypoechogenic to anechoic nodules, measuring under 2 mm in diameter  Left lobe: 3.9 x 1.3 x 1.5 cm. There are hypoechoic nodules in the mid pole and lower pole of the left thyroid lobe, with the largest nodule measuring 4 mm in diameter. There is a minimal 1 x 3 mm diameter shadowing calcification in the midpole >> no goiter, small hypoechoic areas, all <4 mm >> I advised her that no follow up is needed for this   2. Osteopenia  3.  Vitamin D deficiency  PLAN:  1. Patient with long-standing hypothyroidism, on Synthroid, with normal thyroid hh levels. Last set obtained 2 mo ago >>  High TSH, but we did not change her Synthroid dose, since she has had several normal  TSH checks before on the same  dose. She is telling me that she moved LT4 at night if she took Ranitidine in am for reflux >> advised to always keep the LT4 in am and take Ranitidine as far away from it as possible, but if not >4h, that is OK.  - She does not appear to have a goiter, thyroid nodules, or neck compression symptoms. - We discussed about correct intake of levothyroxine, fasting, with water, separated by at least 30 minutes from breakfast, and separated by more than 4 hours from calcium, iron, multivitamins, acid reflux medications (PPIs). Will move LT4  in am every day. - will check thyroid tests in 5-6 weeks; she would like to keep TSH around 2-3 as she feels better at that level.  - continue Synthroid DAW 100 mcg daily for now - I will see her back in 6 months  2. Osteopenia - had a rib fracture detected in 2016 - We reviewed her latest DEXA scan at last visit >> osteopenia - we checked her vit D level  And this turned out to be low >> see #3.  3.  Vitamin D deficiency -  We checked her vitamin D level at last visit and this was found to be low, at 23.4 - I advised her to increase the vitamin D to 4000 units daily >> continue this dose for now - will recheck level at the next blood draw

## 2015-01-24 NOTE — Patient Instructions (Signed)
Please come back for labs in 5 weeks.  Take the thyroid hormone every day, with water, at least 30 minutes before breakfast, separated by at least 4 hours from: - acid reflux medications - calcium - iron - multivitamins  Please return in 6 months.

## 2015-02-28 ENCOUNTER — Other Ambulatory Visit (INDEPENDENT_AMBULATORY_CARE_PROVIDER_SITE_OTHER): Payer: Medicare Other

## 2015-02-28 DIAGNOSIS — E559 Vitamin D deficiency, unspecified: Secondary | ICD-10-CM

## 2015-02-28 DIAGNOSIS — E039 Hypothyroidism, unspecified: Secondary | ICD-10-CM | POA: Diagnosis not present

## 2015-02-28 LAB — T4, FREE: Free T4: 0.94 ng/dL (ref 0.60–1.60)

## 2015-02-28 LAB — TSH: TSH: 5.04 u[IU]/mL — ABNORMAL HIGH (ref 0.35–4.50)

## 2015-02-28 LAB — VITAMIN D 25 HYDROXY (VIT D DEFICIENCY, FRACTURES): VITD: 50.51 ng/mL (ref 30.00–100.00)

## 2015-03-01 ENCOUNTER — Encounter: Payer: Self-pay | Admitting: Internal Medicine

## 2015-03-01 ENCOUNTER — Other Ambulatory Visit: Payer: Self-pay | Admitting: *Deleted

## 2015-03-01 MED ORDER — SYNTHROID 112 MCG PO TABS: 112.0000 ug | ORAL_TABLET | Freq: Every day | ORAL | Status: DC

## 2015-03-12 DIAGNOSIS — E039 Hypothyroidism, unspecified: Secondary | ICD-10-CM | POA: Diagnosis not present

## 2015-03-12 DIAGNOSIS — R3129 Other microscopic hematuria: Secondary | ICD-10-CM | POA: Diagnosis not present

## 2015-03-12 DIAGNOSIS — F419 Anxiety disorder, unspecified: Secondary | ICD-10-CM | POA: Diagnosis not present

## 2015-03-12 DIAGNOSIS — F341 Dysthymic disorder: Secondary | ICD-10-CM | POA: Diagnosis not present

## 2015-03-12 DIAGNOSIS — K219 Gastro-esophageal reflux disease without esophagitis: Secondary | ICD-10-CM | POA: Diagnosis not present

## 2015-03-12 DIAGNOSIS — Z6821 Body mass index (BMI) 21.0-21.9, adult: Secondary | ICD-10-CM | POA: Diagnosis not present

## 2015-03-19 DIAGNOSIS — Z803 Family history of malignant neoplasm of breast: Secondary | ICD-10-CM | POA: Diagnosis not present

## 2015-03-19 DIAGNOSIS — Z1231 Encounter for screening mammogram for malignant neoplasm of breast: Secondary | ICD-10-CM | POA: Diagnosis not present

## 2015-04-11 ENCOUNTER — Other Ambulatory Visit (INDEPENDENT_AMBULATORY_CARE_PROVIDER_SITE_OTHER): Payer: Medicare Other

## 2015-04-11 DIAGNOSIS — E039 Hypothyroidism, unspecified: Secondary | ICD-10-CM

## 2015-04-11 LAB — TSH: TSH: 0.9 u[IU]/mL (ref 0.35–4.50)

## 2015-04-11 LAB — T4, FREE: Free T4: 1.18 ng/dL (ref 0.60–1.60)

## 2015-04-17 DIAGNOSIS — S39012A Strain of muscle, fascia and tendon of lower back, initial encounter: Secondary | ICD-10-CM | POA: Diagnosis not present

## 2015-04-17 DIAGNOSIS — Z6822 Body mass index (BMI) 22.0-22.9, adult: Secondary | ICD-10-CM | POA: Diagnosis not present

## 2015-05-20 DIAGNOSIS — R000 Tachycardia, unspecified: Secondary | ICD-10-CM | POA: Diagnosis not present

## 2015-05-22 ENCOUNTER — Encounter: Payer: Self-pay | Admitting: Internal Medicine

## 2015-06-18 ENCOUNTER — Other Ambulatory Visit: Payer: Self-pay | Admitting: Physician Assistant

## 2015-06-18 DIAGNOSIS — M8589 Other specified disorders of bone density and structure, multiple sites: Secondary | ICD-10-CM | POA: Diagnosis not present

## 2015-07-15 DIAGNOSIS — Z6822 Body mass index (BMI) 22.0-22.9, adult: Secondary | ICD-10-CM | POA: Diagnosis not present

## 2015-07-15 DIAGNOSIS — H9202 Otalgia, left ear: Secondary | ICD-10-CM | POA: Diagnosis not present

## 2015-08-05 ENCOUNTER — Ambulatory Visit (INDEPENDENT_AMBULATORY_CARE_PROVIDER_SITE_OTHER): Payer: Medicare Other | Admitting: Internal Medicine

## 2015-08-05 ENCOUNTER — Encounter: Payer: Self-pay | Admitting: Internal Medicine

## 2015-08-05 VITALS — BP 90/50 | HR 84 | Ht 62.5 in | Wt 125.2 lb

## 2015-08-05 DIAGNOSIS — K222 Esophageal obstruction: Secondary | ICD-10-CM

## 2015-08-05 DIAGNOSIS — K2100 Gastro-esophageal reflux disease with esophagitis, without bleeding: Secondary | ICD-10-CM

## 2015-08-05 DIAGNOSIS — K589 Irritable bowel syndrome without diarrhea: Secondary | ICD-10-CM

## 2015-08-05 MED ORDER — RANITIDINE HCL 300 MG PO TABS: ORAL_TABLET | ORAL | Status: DC

## 2015-08-05 NOTE — Patient Instructions (Signed)
We have sent the following medications to your pharmacy for you to pick up at your convenience:  Ranitidine  Please follow up in one year

## 2015-08-05 NOTE — Progress Notes (Signed)
HISTORY OF PRESENT ILLNESS:  Mary Rubio is a 70 y.o. female who is seen in the office 08/01/2014 for GERD, chronic functional abdominal complaints, and recent colonoscopy. See that dictation for details. She was seen multiple gastroenterologists over the years. Colonoscopy May 2016 was normal. The ileum was normal. Upper endoscopy revealed multiple distal rings and esophagitis. She did not tolerate PPI. She continues" low acid diet" and H2 receptor antagonist therapy in the form of ranitidine 150 mg twice daily. She presents today for routine follow-up. She reports that she is doing well on her current regimen. No problems with pain, dysphagia, or breakthrough reflux. She needs to be careful with her diet as she has difficulties with meat. Otherwise okay.  REVIEW OF SYSTEMS:  All non-GI ROS negative except for sinus and allergy, fatigue, night sweats, nosebleeds, headaches, hearing problems, sleeping problems  Past Medical History  Diagnosis Date  . Environmental allergies     Weekly allergy shots  . Depression   . GERD (gastroesophageal reflux disease)   . Hypothyroidism   . Anxiety   . Hyperlipidemia   . Hiatal hernia   . Asthma   . IBS (irritable bowel syndrome)   . Diverticulosis   . Colon polyps   . Osteoporosis   . Allergy   . Osteopenia   . Esophageal stricture     Past Surgical History  Procedure Laterality Date  . Abdominal hysterectomy    . Cholecystectomy    . Bunionectomy Right   . Thumb surgery Left     for cyst  . Exploratory laparotomy      endometriosis  . Bartholin gland cyst excision    . Facial cyst      excision  . Esophagogastroduodenoscopy N/A 10/21/2012    Procedure: ESOPHAGOGASTRODUODENOSCOPY (EGD);  Surgeon: Beryle Beams, MD;  Location: [facility];  Service: Endoscopy;  Laterality: N/A;  . Nasal septum surgery      Social History Mary Rubio  reports that she has never smoked. She has never used smokeless tobacco. She reports  that she does not drink alcohol or use illicit drugs.  family history includes Alzheimer's disease in her father; Arrhythmia in her father; CAD in her sister; Dementia in her father; Heart disease in her father, maternal grandfather, mother, paternal grandmother, and sister; Hypertension in her mother; Lung cancer in her mother; Skin cancer in her father; Thyroid disease in her daughter, mother, and sister.  Allergies  Allergen Reactions  . Augmentin [Amoxicillin-Pot Clavulanate] Other (See Comments)    Stomach pain   . Crestor [Rosuvastatin]     Myalgias  . Demerol [Meperidine]   . Latex Other (See Comments)    Canker sores in mouth (discovered by dentist)  . Meperidine Hcl Other (See Comments)    Does not remember   . Morphine And Related Itching    Has tolerated small doses of hydrocodone  . Pentazocine Lactate Nausea And Vomiting    Sick on stomach  . Simvastatin Other (See Comments)    Muscle aches   . Talwin [Pentazocine]   . Codeine Rash    Has tolerated small doses of hydrocodone  . Morphine Rash       PHYSICAL EXAMINATION: Vital signs: BP 90/50 mmHg  Pulse 84  Ht 5' 2.5" (1.588 m)  Wt 125 lb 3 oz (56.785 kg)  BMI 22.52 kg/m2  Constitutional: generally well-appearing, no acute distress Psychiatric: alert and oriented x3, cooperative Eyes: extraocular movements intact, anicteric, conjunctiva pink Mouth: oral pharynx moist, no  lesions Neck: supple no lymphadenopathy Cardiovascular: heart regular rate and rhythm, no murmur Lungs: clear to auscultation bilaterally Abdomen: soft, nontender, nondistended, no obvious ascites, no peritoneal signs, normal bowel sounds, no organomegaly Rectal:Omitted Extremities: no clubbing cyanosis or lower extremity edema bilaterally Skin: no lesions on visible extremities Neuro: No focal deficits. Cranial nerves intact  ASSESSMENT:  #1. GERD. Previous endoscopy with esophagitis and stricture. Currently asymptomatic with reflux  precautions and ranitidine twice daily #2. History of functional abdominal complaints #3. Normal colonoscopy with ileal intubation May 2016   PLAN:  #1. Continue reflux precautions #2. Refill ranitidine #3. Routine GI follow-up one year  15 minutes spent face-to-face with the patient. The majority of the time was use for counseling regarding her reflux disease management

## 2015-08-08 DIAGNOSIS — J0100 Acute maxillary sinusitis, unspecified: Secondary | ICD-10-CM | POA: Diagnosis not present

## 2015-08-08 DIAGNOSIS — K121 Other forms of stomatitis: Secondary | ICD-10-CM | POA: Diagnosis not present

## 2015-08-08 DIAGNOSIS — Z6822 Body mass index (BMI) 22.0-22.9, adult: Secondary | ICD-10-CM | POA: Diagnosis not present

## 2015-08-21 DIAGNOSIS — H40053 Ocular hypertension, bilateral: Secondary | ICD-10-CM | POA: Diagnosis not present

## 2015-08-21 DIAGNOSIS — Z83511 Family history of glaucoma: Secondary | ICD-10-CM | POA: Diagnosis not present

## 2015-08-21 DIAGNOSIS — H40023 Open angle with borderline findings, high risk, bilateral: Secondary | ICD-10-CM | POA: Diagnosis not present

## 2015-08-27 ENCOUNTER — Other Ambulatory Visit: Payer: Self-pay | Admitting: Internal Medicine

## 2015-09-02 DIAGNOSIS — H6982 Other specified disorders of Eustachian tube, left ear: Secondary | ICD-10-CM | POA: Diagnosis not present

## 2015-09-02 DIAGNOSIS — Z6822 Body mass index (BMI) 22.0-22.9, adult: Secondary | ICD-10-CM | POA: Diagnosis not present

## 2015-11-08 DIAGNOSIS — R51 Headache: Secondary | ICD-10-CM | POA: Diagnosis not present

## 2015-11-08 DIAGNOSIS — J45909 Unspecified asthma, uncomplicated: Secondary | ICD-10-CM | POA: Diagnosis not present

## 2015-11-08 DIAGNOSIS — Z77098 Contact with and (suspected) exposure to other hazardous, chiefly nonmedicinal, chemicals: Secondary | ICD-10-CM | POA: Diagnosis not present

## 2015-11-08 DIAGNOSIS — R0602 Shortness of breath: Secondary | ICD-10-CM | POA: Diagnosis not present

## 2015-11-08 DIAGNOSIS — J019 Acute sinusitis, unspecified: Secondary | ICD-10-CM | POA: Diagnosis not present

## 2015-11-08 DIAGNOSIS — Z6822 Body mass index (BMI) 22.0-22.9, adult: Secondary | ICD-10-CM | POA: Diagnosis not present

## 2015-11-08 DIAGNOSIS — M791 Myalgia: Secondary | ICD-10-CM | POA: Diagnosis not present

## 2015-11-21 DIAGNOSIS — J3489 Other specified disorders of nose and nasal sinuses: Secondary | ICD-10-CM | POA: Diagnosis not present

## 2015-11-21 DIAGNOSIS — R0602 Shortness of breath: Secondary | ICD-10-CM | POA: Diagnosis not present

## 2015-11-21 DIAGNOSIS — R5383 Other fatigue: Secondary | ICD-10-CM | POA: Diagnosis not present

## 2015-11-21 DIAGNOSIS — Z6823 Body mass index (BMI) 23.0-23.9, adult: Secondary | ICD-10-CM | POA: Diagnosis not present

## 2015-11-21 DIAGNOSIS — J029 Acute pharyngitis, unspecified: Secondary | ICD-10-CM | POA: Diagnosis not present

## 2015-11-24 IMAGING — US US SOFT TISSUE HEAD/NECK
1 series · 13 of 25 positions shown · non-contrast
Comparison: None.

CLINICAL DATA: Clinically suspected left thyroid nodules

EXAM:
THYROID ULTRASOUND
TECHNIQUE: Ultrasound examination of the thyroid gland and adjacent soft
tissues was performed.

[Series 1: us soft tissue head/neck · 0.08mm/px · 13 of 57 slices shown]
[im 1/57]
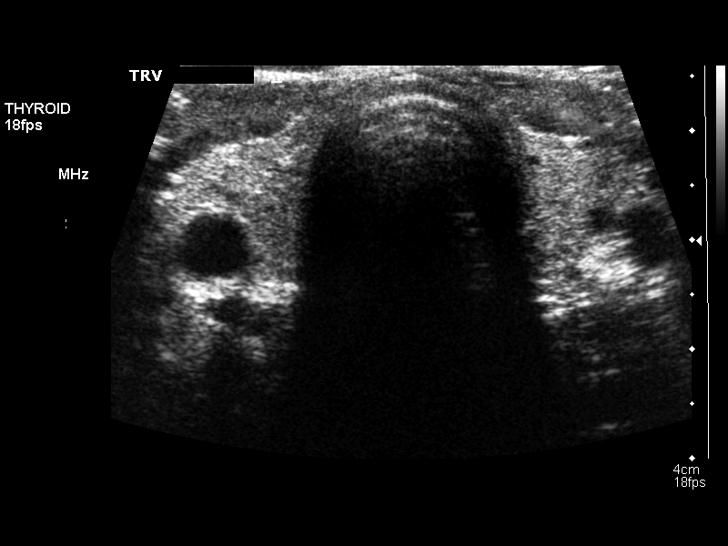
[im 5/57]
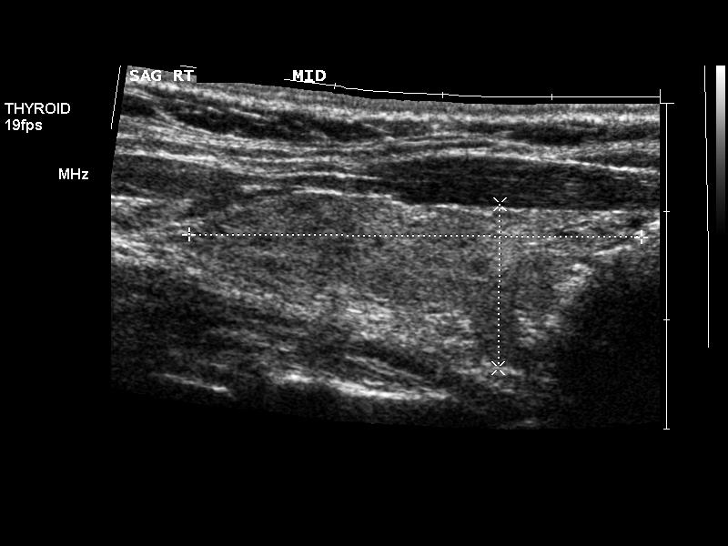
[im 10/57]
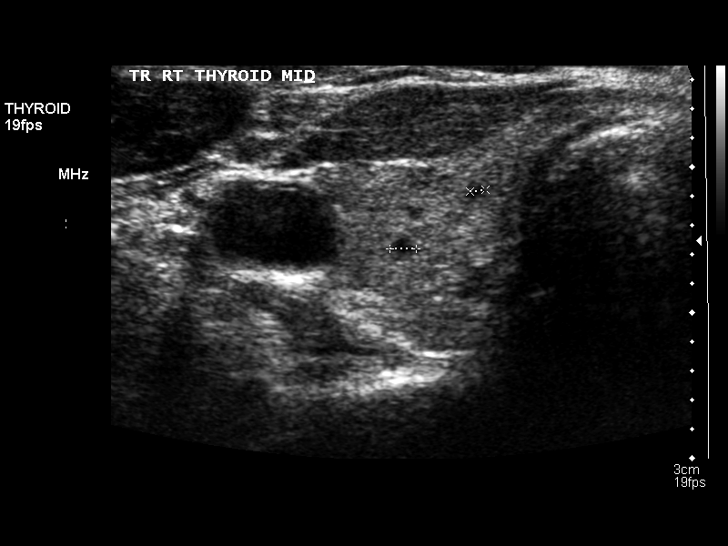
[im 15/57]
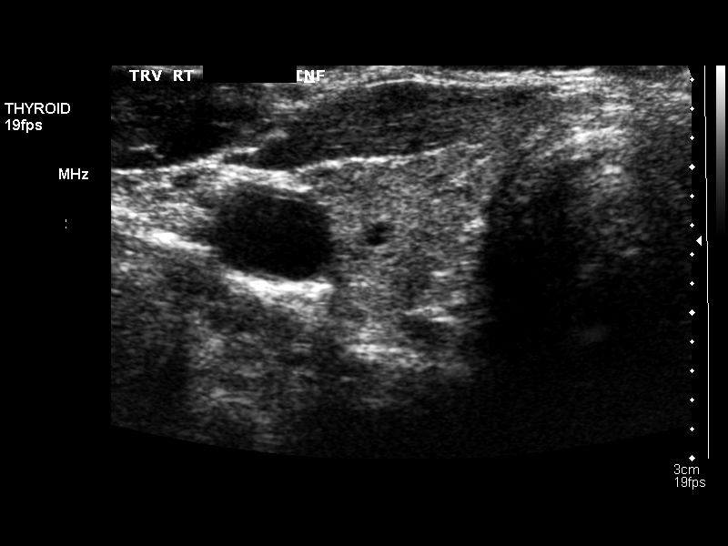
[im 19/57]
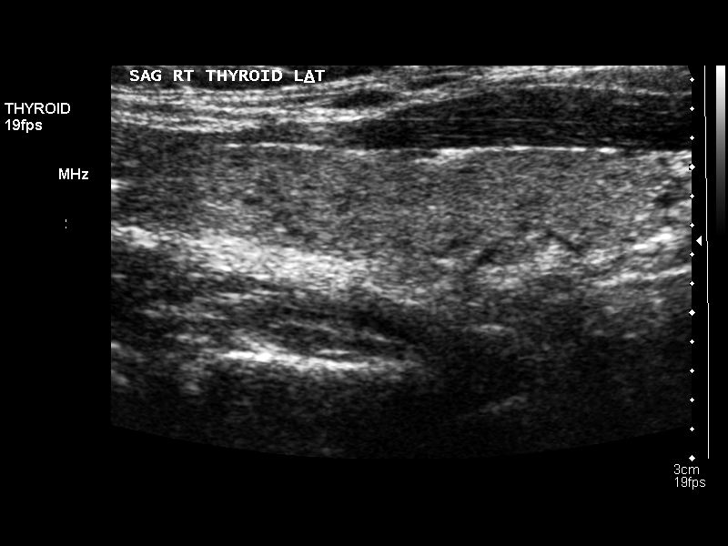
[im 24/57]
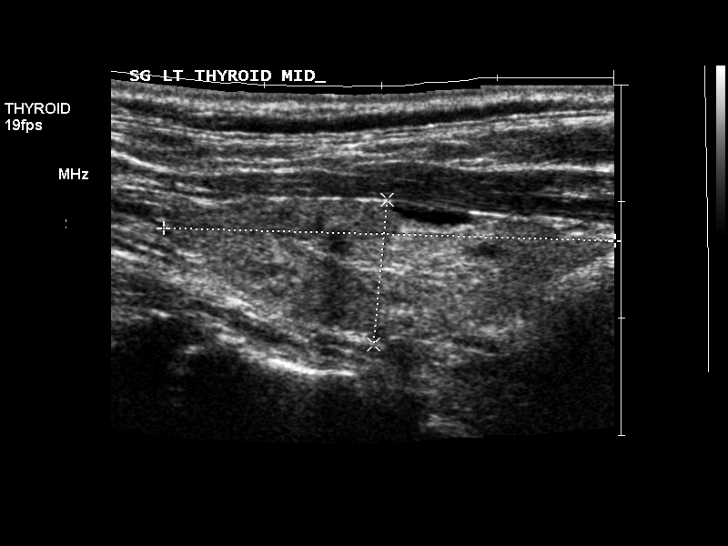
[im 29/57]
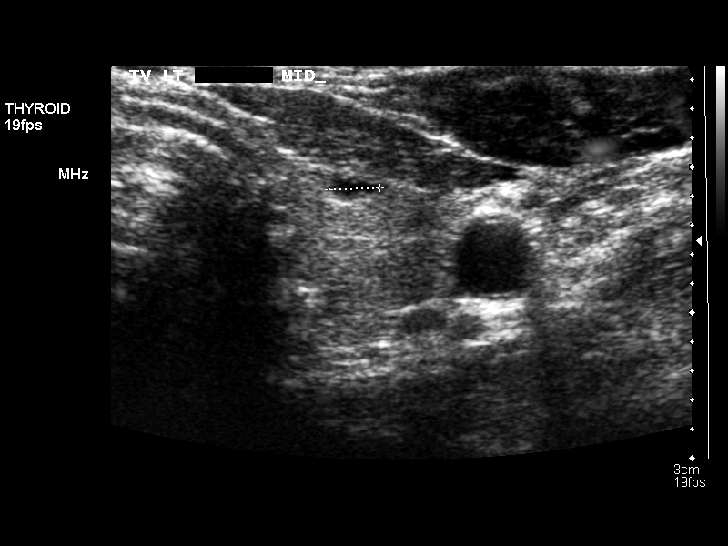
[im 33/57]
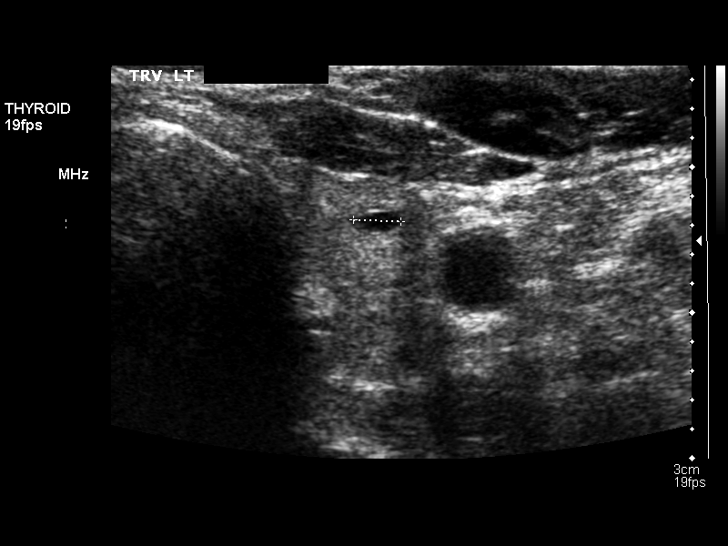
[im 38/57]
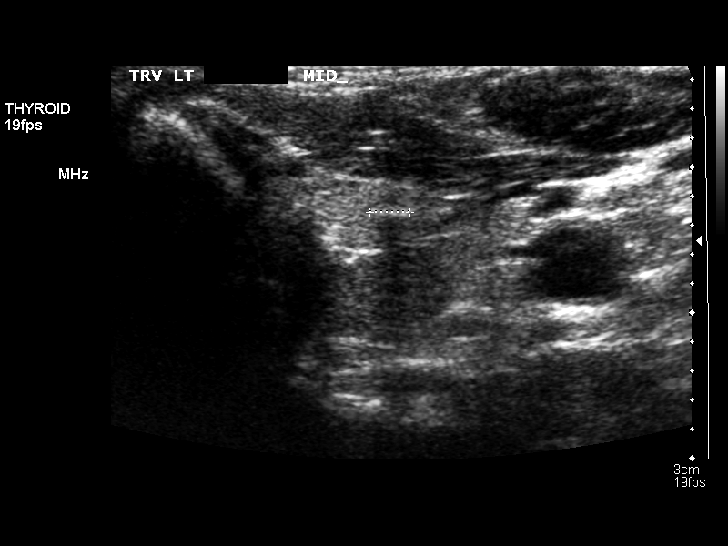
[im 43/57]
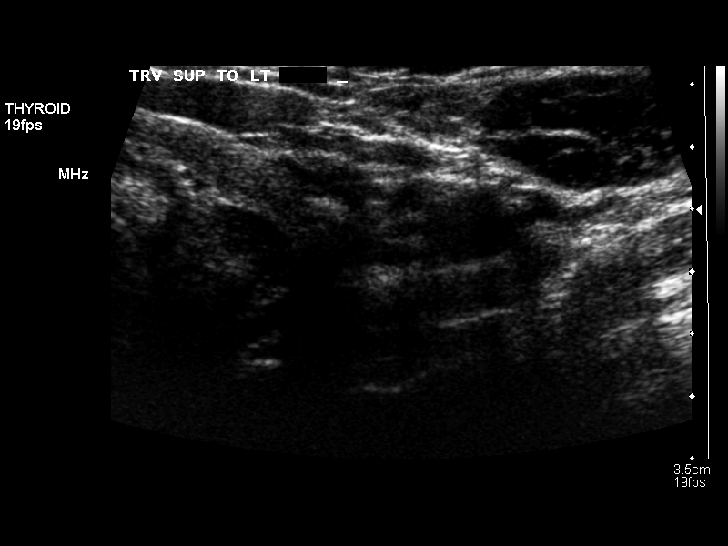
[im 47/57]
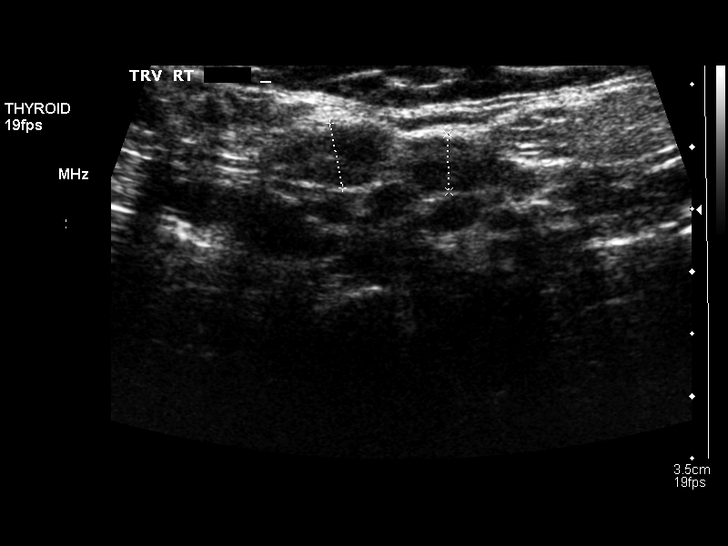
[im 52/57]
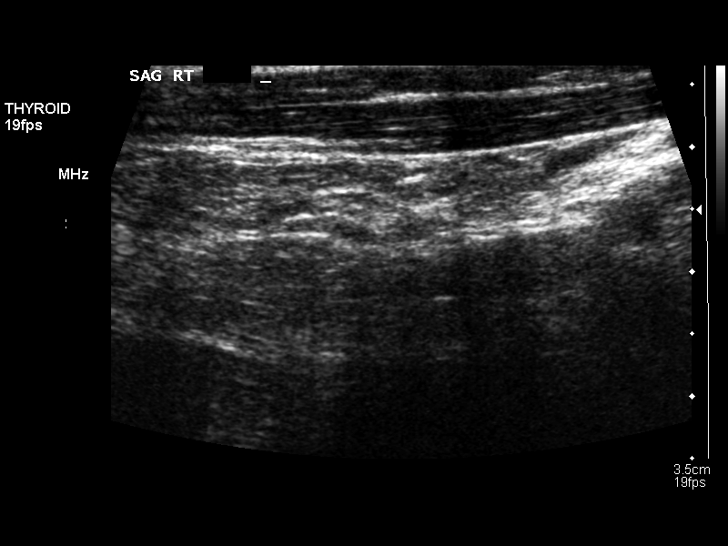
[im 57/57]
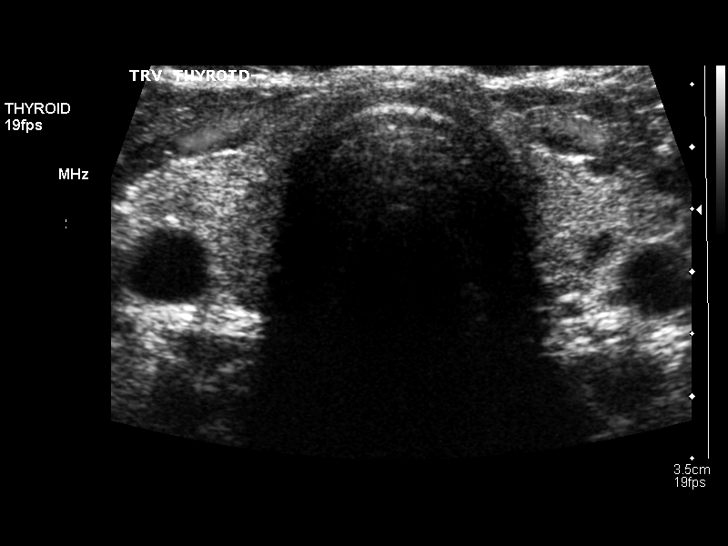

[13 of 25 positions shown; findings below may reference images not displayed]

FINDINGS: Right thyroid lobe

Measurements: 4.2 x 1.5 x 1.2 cm. There are multiple hypoechoic to
anechoic nodules demonstrated in the right thyroid lobe measuring
under 2 mm in diameter.

Left thyroid lobe

Measurements: 3.9 x 1.3 x 1.5 cm. There are hypoechoic nodules in
the mid pole and lower pole of the left thyroid lobe with the
largest nodule measuring 4 mm in diameter. There is a minimal 1 x 3
mm diameter shadowing calcification in the midpole..

Isthmus

Thickness: 0.21 cm.  No nodules visualized.

Lymphadenopathy

None visualized.
IMPRESSION: There are multiple hypoechoic nodules in both thyroid lobes. The
largest measures 4 mm in diameter and lies in the midpole of the
left thyroid lobe. There is a coarse calcification in the midpole on
the left as well.

Findings do not meet current SRU consensus criteria for biopsy.
Follow-up by clinical exam is recommended. If patient has known risk
factors for thyroid carcinoma, consider follow-up ultrasound in 12
months. If patient is clinically hyperthyroid, consider nuclear
medicine thyroid uptake and scan.Reference: Management of Thyroid
Nodules Detected at US: Society of Radiologists in Ultrasound

## 2015-11-28 DIAGNOSIS — J029 Acute pharyngitis, unspecified: Secondary | ICD-10-CM | POA: Diagnosis not present

## 2015-11-28 DIAGNOSIS — J3489 Other specified disorders of nose and nasal sinuses: Secondary | ICD-10-CM | POA: Diagnosis not present

## 2015-11-29 DIAGNOSIS — Z2821 Immunization not carried out because of patient refusal: Secondary | ICD-10-CM | POA: Diagnosis not present

## 2015-11-29 DIAGNOSIS — Z0000 Encounter for general adult medical examination without abnormal findings: Secondary | ICD-10-CM | POA: Diagnosis not present

## 2015-11-29 DIAGNOSIS — E785 Hyperlipidemia, unspecified: Secondary | ICD-10-CM | POA: Diagnosis not present

## 2015-11-29 DIAGNOSIS — Z6822 Body mass index (BMI) 22.0-22.9, adult: Secondary | ICD-10-CM | POA: Diagnosis not present

## 2015-12-06 DIAGNOSIS — R7309 Other abnormal glucose: Secondary | ICD-10-CM | POA: Diagnosis not present

## 2015-12-06 DIAGNOSIS — E039 Hypothyroidism, unspecified: Secondary | ICD-10-CM | POA: Diagnosis not present

## 2015-12-11 ENCOUNTER — Encounter: Payer: Self-pay | Admitting: Internal Medicine

## 2015-12-11 ENCOUNTER — Ambulatory Visit (INDEPENDENT_AMBULATORY_CARE_PROVIDER_SITE_OTHER): Payer: Medicare Other | Admitting: Internal Medicine

## 2015-12-11 VITALS — BP 122/68 | HR 75 | Ht 62.5 in | Wt 125.6 lb

## 2015-12-11 DIAGNOSIS — E039 Hypothyroidism, unspecified: Secondary | ICD-10-CM | POA: Diagnosis not present

## 2015-12-11 DIAGNOSIS — M8580 Other specified disorders of bone density and structure, unspecified site: Secondary | ICD-10-CM

## 2015-12-11 DIAGNOSIS — E559 Vitamin D deficiency, unspecified: Secondary | ICD-10-CM | POA: Diagnosis not present

## 2015-12-11 MED ORDER — SYNTHROID 100 MCG PO TABS: 100.0000 ug | ORAL_TABLET | Freq: Every day | ORAL | 2 refills | Status: DC

## 2015-12-11 NOTE — Progress Notes (Addendum)
Patient ID: Mary Rubio, female   DOB: 07-07-1945, 70 y.o.   MRN: [id number]   HPI  Mary Rubio is a 70 y.o.-year-old female, retruning for f/u for hypothyroidism, dx 1996. Last visit 10 mo ago.  Pt is c/o HAs, dizziness, jitteriness. She had recent TFTs by PCP >> TSH suppressed. Dose of LT4 was decreased to 100 mcg daily.  She had 2 sinus infections >> got ABx, injectable steroids, albuterol inhaler.   Pt. is on Synthroid DAW 100 mcg, taken: - fasting - with water - no b'fast  - + calcium with lunch if taking - + Ranitine with lunch - no iron, multivitamins   I reviewed pt's thyroid tests: 12/06/2015: TSH 0.103 Lab Results  Component Value Date   TSH 0.90 04/11/2015   TSH 5.04 (H) 02/28/2015   TSH 6.675 (H) 11/28/2014   TSH 2.36 06/29/2014   TSH 1.84 02/27/2014   TSH 0.948 01/22/2014   TSH 0.68 10/19/2013   TSH 0.49 08/31/2013   TSH 4.728 (H) 03/08/2013   TSH 9.188 (H) 12/28/2012   FREET4 1.18 04/11/2015   FREET4 0.94 02/28/2015   FREET4 1.09 11/28/2014   FREET4 0.85 06/29/2014   FREET4 0.99 02/27/2014   FREET4 1.86 (H) 01/22/2014   FREET4 1.10 10/19/2013   FREET4 1.00 08/31/2013   FREET4 1.26 03/08/2013   FREET4 1.35 04/27/2012  08/17/2013: TSH 3.45, fT4 0.70 >> she was advised to increase the LT4 to 125 mcg, but she did not do it. She feels better on 112 mcg.  07/04/2013: TSH 6.81 >> dose of LT4 increased from 100 to 112 mcg  Pt describes: - + fatigue  - no palpitations - resolved weight loss  - no  cold intolerance - resolved N/V/reflux/diarrhea - resolved hair loss  Pt denies feeling nodules in neck, hoarseness, dysphagia/odynophagia, SOB with lying down.  She had a h/o radiation tx to head or neck (for ear infection >> experimental tx - several tx's)  She also has a h/o vitamin D insufficiency: Lab Results  Component Value Date   VD25OH 50.51 02/28/2015   VD25OH 23.35 (L) 07/24/2014   Last summer, we started vit D supplementation (4000  IU daily) >> vit D level at the beginning of this year >> normal  She also has osteopenia: - recent DEXA scan at [facility] >> will need to get records  DEXA scan 06/06/2013: L1-L4: -1.2 RFN: -1.9, LFN: -1.7 FRAX: MOF 16.4%, Hip: 2.3%  ROS: Constitutional: see HPI, + poor sleep, + hot flushes Eyes: no blurry vision, no xerophthalmia ENT: no sore throat, no nodules palpated in throat, no dysphagia/odynophagia Cardiovascular: no CP/SOB/+ palpitations/no leg swelling Respiratory: + cough/+ SOB Gastrointestinal: no N/V/D/C/heartburn Musculoskeletal: + muscle aches/joint aches Skin: no rashes,no hair loss Neurological: no tremors/numbness/tingling/dizziness, + HA  I reviewed pt's medications, allergies, PMH, social hx, family hx, and changes were documented in the history of present illness. Otherwise, unchanged from my initial visit note: Past Medical History:  Diagnosis Date  . Allergy   . Anxiety   . Asthma   . Colon polyps   . Depression   . Diverticulosis   . Environmental allergies    Weekly allergy shots  . Esophageal stricture   . GERD (gastroesophageal reflux disease)   . Hiatal hernia   . Hyperlipidemia   . Hypothyroidism   . IBS (irritable bowel syndrome)   . Osteopenia   . Osteoporosis    Past Surgical History:  Procedure Laterality Date  . ABDOMINAL HYSTERECTOMY    .  BARTHOLIN GLAND CYST EXCISION    . BUNIONECTOMY Right   . CHOLECYSTECTOMY    . ESOPHAGOGASTRODUODENOSCOPY N/A 10/21/2012   Procedure: ESOPHAGOGASTRODUODENOSCOPY (EGD);  Surgeon: Beryle Beams, MD;  Location: [facility];  Service: Endoscopy;  Laterality: N/A;  . EXPLORATORY LAPAROTOMY     endometriosis  . facial cyst     excision  . NASAL SEPTUM SURGERY    . thumb surgery Left    for cyst   History   Social History  . Marital Status: Single    Spouse Name: N/A    Number of Children: 1  . Years of Education: college   Occupational History  . caregiver    Social History Main Topics   . Smoking status: Never Smoker   . Smokeless tobacco: No  . Alcohol Use: No  . Drug Use: No   Current Outpatient Prescriptions on File Prior to Visit  Medication Sig Dispense Refill  . Polyethyl Glycol-Propyl Glycol (SYSTANE OP) Place 1 drop into both eyes daily.    . ranitidine (ZANTAC) 300 MG tablet TAKE 1 TABLET (300 MG TOTAL) BY MOUTH 2 (TWO) TIMES DAILY. 60 tablet 5  . SYNTHROID 112 MCG tablet TAKE 1 TABLET (112 MCG TOTAL) BY MOUTH DAILY BEFORE BREAKFAST. 90 tablet 1  . Triamcinolone Acetonide (NASACORT ALLERGY 24HR NA) Place 1 spray into the nose daily.    . Vitamin D, Ergocalciferol, (DRISDOL) 50000 UNITS CAPS capsule Take 4,000 Units by mouth daily.      No current facility-administered medications on file prior to visit.    Allergies  Allergen Reactions  . Augmentin [Amoxicillin-Pot Clavulanate] Other (See Comments)    Stomach pain   . Crestor [Rosuvastatin]     Myalgias  . Demerol [Meperidine]   . Latex Other (See Comments)    Canker sores in mouth (discovered by dentist)  . Meperidine Hcl Other (See Comments)    Does not remember   . Morphine And Related Itching    Has tolerated small doses of hydrocodone  . Pentazocine Lactate Nausea And Vomiting    Sick on stomach  . Simvastatin Other (See Comments)    Muscle aches   . Talwin [Pentazocine]   . Codeine Rash    Has tolerated small doses of hydrocodone  . Morphine Rash   Family History  Problem Relation Age of Onset  . Lung cancer Mother   . Hypertension Mother   . Skin cancer Father   . Dementia Father   . Heart disease Sister   . Thyroid disease Daughter   . Heart disease Maternal Grandfather   . Heart disease Paternal Grandmother   . Thyroid disease Mother   . Arrhythmia Father   . CAD Sister     MI/stent age 63, CABG age 49  . Heart disease Father     Several relatives on dad's side have had MIs/CABG  . Heart disease Mother   . Thyroid disease Sister   . Alzheimer's disease Father    PE: There  were no vitals taken for this visit. There is no height or weight on file to calculate BMI. Wt Readings from Last 3 Encounters:  08/05/15 125 lb 3 oz (56.8 kg)  01/24/15 120 lb 6.4 oz (54.6 kg)  11/28/14 120 lb (54.4 kg)   Constitutional: normal weight, in NAD Eyes: PERRLA, EOMI, no exophthalmos ENT: moist mucous membranes, no thyromegaly, no cervical lymphadenopathy Cardiovascular: RRR, No MRG Respiratory: CTA B Gastrointestinal: abdomen soft, NT, ND, BS+ Musculoskeletal: no deformities, strength intact  in all 4 Skin: moist, warm, no rashes Neurological: no tremor with outstretched hands, DTR normal in all 4  ASSESSMENT: 1. Hypothyroidism - Thyroid U/S (03/17/2013) - a nonenlarged thyroid, with very small hypoechoic regions:  Right lobe: 4.2 x 1.5 x 1.2 cm. There are multiple hypoechogenic to anechoic nodules, measuring under 2 mm in diameter  Left lobe: 3.9 x 1.3 x 1.5 cm. There are hypoechoic nodules in the mid pole and lower pole of the left thyroid lobe, with the largest nodule measuring 4 mm in diameter. There is a minimal 1 x 3 mm diameter shadowing calcification in the midpole >> no goiter, small hypoechoic areas, all <4 mm >> no follow up is needed  2. Osteopenia  3.  Vitamin D deficiency  PLAN:  1. Patient with long-standing hypothyroidism, on Synthroid, with TFT fluctuation but overall fair control. She had 2 TSH levels > ULN at the beginning of the year as she moved LT4 at  night if she took Ranitidine in am for reflux >> advised to always keep the LT4 in am and take Ranitidine as far away from it as possible, but if not >4h, that is OK. Next TSH after the change >> normal. However, she had a repeat TSH outside [facility] system: suppressed, at 0.103 5 days ago. Dose of LT4 was decreased to 100 but the generic was called in >> pt refuses to take this >> will call in the Synthroid DAW. - She does not appear to have a goiter, thyroid nodules, or neck compression symptoms. - We  discussed about correct intake of levothyroxine, fasting, with water, separated by at least 30 minutes from breakfast, and separated by more than 4 hours from calcium, iron, multivitamins, acid reflux medications (PPIs). She now takes the LT4 correctly. - will change to brand name Synthroid DAW 100 mcg daily - I will see her back in 6 mo but in 5-6 weeks for labs  2. Osteopenia - had a rib fracture detected in 2016 - We reviewed her 2015 DEXA scan at last visit >> osteopenia - will get the new DEXA scan report from [facility] - on vitamin D  3.  Vitamin D deficiency - on  vitamin D 4000 units daily >> continue this dose for now - will recheck level with the TFTs in 5-6 weeks  12/17/15 Addendum: Received this years DEXA scan report from [facility] (06/18/2015): L1-L4: -1.9 << -1.2 RFN: -2.0 << -1.9, LFN: -2.1 << -1.7  FRAX: MOF 16% << 16.4%, Hip: 2.7% << 2.3% These appear worse. The vitamin D is now normal, and working on normalizing her TFTs. I will advise her to start weightbearing exercise and will need to repeat another bone scan in 2 years. If worse, we'll need to start anti-resorptive medication.  Philemon Kingdom, MD PhD [facility]

## 2015-12-11 NOTE — Patient Instructions (Signed)
Please switch to Synthroid DAW 100 mcg.  Take the thyroid hormone every day, with water, at least 30 minutes before breakfast, separated by at least 4 hours from: - acid reflux medications - calcium - iron - multivitamins  Please come back for labs in 5-6 weeks.Marland Kitchen

## 2015-12-17 ENCOUNTER — Encounter: Payer: Self-pay | Admitting: Internal Medicine

## 2016-01-15 ENCOUNTER — Other Ambulatory Visit (INDEPENDENT_AMBULATORY_CARE_PROVIDER_SITE_OTHER): Payer: Medicare Other

## 2016-01-15 DIAGNOSIS — H8113 Benign paroxysmal vertigo, bilateral: Secondary | ICD-10-CM | POA: Diagnosis not present

## 2016-01-15 DIAGNOSIS — E039 Hypothyroidism, unspecified: Secondary | ICD-10-CM

## 2016-01-15 DIAGNOSIS — R002 Palpitations: Secondary | ICD-10-CM | POA: Diagnosis not present

## 2016-01-15 DIAGNOSIS — Z6823 Body mass index (BMI) 23.0-23.9, adult: Secondary | ICD-10-CM | POA: Diagnosis not present

## 2016-01-15 DIAGNOSIS — Z2821 Immunization not carried out because of patient refusal: Secondary | ICD-10-CM | POA: Diagnosis not present

## 2016-01-15 LAB — T4, FREE: Free T4: 1.43 ng/dL (ref 0.60–1.60)

## 2016-01-15 LAB — TSH: TSH: 0.1 u[IU]/mL — ABNORMAL LOW (ref 0.35–4.50)

## 2016-01-15 LAB — VITAMIN D 25 HYDROXY (VIT D DEFICIENCY, FRACTURES): VITD: 29.41 ng/mL — ABNORMAL LOW (ref 30.00–100.00)

## 2016-01-16 MED ORDER — SYNTHROID 88 MCG PO TABS: 88.0000 ug | ORAL_TABLET | Freq: Every day | ORAL | 1 refills | Status: DC

## 2016-01-21 ENCOUNTER — Telehealth: Payer: Self-pay | Admitting: Physical Therapy

## 2016-01-21 NOTE — Telephone Encounter (Signed)
Patient cxl PT eval for 01/22/16, will call back to reschedule

## 2016-01-22 ENCOUNTER — Ambulatory Visit: Payer: Medicare Other | Admitting: Physical Therapy

## 2016-01-28 NOTE — Telephone Encounter (Signed)
Pt cxl PT eval 01/22/16. 01/28/16 left message to call if she wants to reschedule PT eval

## 2016-02-10 DIAGNOSIS — J329 Chronic sinusitis, unspecified: Secondary | ICD-10-CM | POA: Diagnosis not present

## 2016-02-10 DIAGNOSIS — Z6823 Body mass index (BMI) 23.0-23.9, adult: Secondary | ICD-10-CM | POA: Diagnosis not present

## 2016-02-14 ENCOUNTER — Other Ambulatory Visit: Payer: Self-pay | Admitting: Internal Medicine

## 2016-02-14 DIAGNOSIS — J3089 Other allergic rhinitis: Secondary | ICD-10-CM | POA: Diagnosis not present

## 2016-02-14 DIAGNOSIS — E559 Vitamin D deficiency, unspecified: Secondary | ICD-10-CM

## 2016-02-14 DIAGNOSIS — Z6823 Body mass index (BMI) 23.0-23.9, adult: Secondary | ICD-10-CM | POA: Diagnosis not present

## 2016-02-14 DIAGNOSIS — E039 Hypothyroidism, unspecified: Secondary | ICD-10-CM

## 2016-02-20 ENCOUNTER — Other Ambulatory Visit: Payer: Medicare Other

## 2016-02-28 DIAGNOSIS — H40023 Open angle with borderline findings, high risk, bilateral: Secondary | ICD-10-CM | POA: Diagnosis not present

## 2016-03-05 ENCOUNTER — Other Ambulatory Visit (INDEPENDENT_AMBULATORY_CARE_PROVIDER_SITE_OTHER): Payer: Medicare Other

## 2016-03-05 DIAGNOSIS — E039 Hypothyroidism, unspecified: Secondary | ICD-10-CM

## 2016-03-05 DIAGNOSIS — E559 Vitamin D deficiency, unspecified: Secondary | ICD-10-CM | POA: Diagnosis not present

## 2016-03-05 LAB — TSH: TSH: 0.48 u[IU]/mL (ref 0.35–4.50)

## 2016-03-05 LAB — VITAMIN D 25 HYDROXY (VIT D DEFICIENCY, FRACTURES): VITD: 31.73 ng/mL (ref 30.00–100.00)

## 2016-03-05 LAB — T4, FREE: Free T4: 1 ng/dL (ref 0.60–1.60)

## 2016-04-02 DIAGNOSIS — N6001 Solitary cyst of right breast: Secondary | ICD-10-CM | POA: Diagnosis not present

## 2016-04-02 DIAGNOSIS — N6311 Unspecified lump in the right breast, upper outer quadrant: Secondary | ICD-10-CM | POA: Diagnosis not present

## 2016-04-02 DIAGNOSIS — Z803 Family history of malignant neoplasm of breast: Secondary | ICD-10-CM | POA: Diagnosis not present

## 2016-04-20 ENCOUNTER — Other Ambulatory Visit: Payer: Self-pay | Admitting: Internal Medicine

## 2016-05-22 DIAGNOSIS — M19041 Primary osteoarthritis, right hand: Secondary | ICD-10-CM | POA: Diagnosis not present

## 2016-05-22 DIAGNOSIS — Z6823 Body mass index (BMI) 23.0-23.9, adult: Secondary | ICD-10-CM | POA: Diagnosis not present

## 2016-05-22 DIAGNOSIS — H6122 Impacted cerumen, left ear: Secondary | ICD-10-CM | POA: Diagnosis not present

## 2016-05-22 DIAGNOSIS — H1032 Unspecified acute conjunctivitis, left eye: Secondary | ICD-10-CM | POA: Diagnosis not present

## 2016-06-03 ENCOUNTER — Other Ambulatory Visit: Payer: Self-pay

## 2016-06-09 ENCOUNTER — Encounter: Payer: Self-pay | Admitting: Internal Medicine

## 2016-06-09 ENCOUNTER — Ambulatory Visit (INDEPENDENT_AMBULATORY_CARE_PROVIDER_SITE_OTHER): Payer: Medicare Other | Admitting: Internal Medicine

## 2016-06-09 VITALS — BP 122/70 | HR 77 | Wt 130.0 lb

## 2016-06-09 DIAGNOSIS — E559 Vitamin D deficiency, unspecified: Secondary | ICD-10-CM

## 2016-06-09 DIAGNOSIS — E039 Hypothyroidism, unspecified: Secondary | ICD-10-CM

## 2016-06-09 DIAGNOSIS — M8580 Other specified disorders of bone density and structure, unspecified site: Secondary | ICD-10-CM

## 2016-06-09 LAB — T4, FREE: Free T4: 1.08 ng/dL (ref 0.60–1.60)

## 2016-06-09 LAB — TSH: TSH: 1.03 u[IU]/mL (ref 0.35–4.50)

## 2016-06-09 LAB — VITAMIN D 25 HYDROXY (VIT D DEFICIENCY, FRACTURES): VITD: 27.42 ng/mL — ABNORMAL LOW (ref 30.00–100.00)

## 2016-06-09 NOTE — Patient Instructions (Signed)
Please switch to Synthroid DAW 80 mcg.  Take the thyroid hormone every day, with water, at least 30 minutes before breakfast, separated by at least 4 hours from: - acid reflux medications - calcium - iron - multivitamins  Please stop at the lab.  Please come back for labs in 6 months.

## 2016-06-09 NOTE — Progress Notes (Signed)
Patient ID: Mary Rubio, female   DOB: [birth year], 71 y.o.   MRN: [id number]   HPI  Mary Rubio is a 71 y.o.-year-old female, retruning for f/u for hypothyroidism, dx 1996, vitamin D deficiency, and osteopenia. Last visit 6 mo ago.  She has a lot of stress. 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 Sister just dx'ed with multiple myeloma.  Pt. is on Synthroid DAW 100 >> 88 mcg, taken: - in am - fasting - at least 1h from b'fast - no Ca, Fe, MVI, PPIs - + Zantac - lunchtime - not on Biotin  I reviewed pt's thyroid tests: Lab Results  Component Value Date   TSH 0.48 03/05/2016   TSH 0.10 (L) 01/15/2016   TSH 0.90 04/11/2015   TSH 5.04 (H) 02/28/2015   TSH 6.675 (H) 11/28/2014   TSH 2.36 06/29/2014   TSH 1.84 02/27/2014   TSH 0.948 01/22/2014   TSH 0.68 10/19/2013   TSH 0.49 08/31/2013   FREET4 1.00 03/05/2016   FREET4 1.43 01/15/2016   FREET4 1.18 04/11/2015   FREET4 0.94 02/28/2015   FREET4 1.09 11/28/2014   FREET4 0.85 06/29/2014   FREET4 0.99 02/27/2014   FREET4 1.86 (H) 01/22/2014   FREET4 1.10 10/19/2013   FREET4 1.00 08/31/2013  12/06/2015: TSH 0.103 08/17/2013: TSH 3.45, fT4 0.70 >> she was advised to increase the LT4 to 125 mcg, but she did not do it. She feels better on 112 mcg.  07/04/2013: TSH 6.81 >> dose of LT4 increased from 100 to 112 mcg  Pt denies: - feeling nodules in neck - hoarseness - dysphagia - choking - SOB with lying down  She had a h/o radiation tx to head or neck (for ear infection >> experimental tx - several tx's).  She also has a h/o vitamin D insufficiency: Lab Results  Component Value Date   VD25OH 31.73 03/05/2016   VD25OH 29.41 (L) 01/15/2016   VD25OH 50.51 02/28/2015   VD25OH 23.35 (L) 07/24/2014   We started vit D supplementation (4000 IU daily) in Summer 2016. She may forget doses.  She also has osteopenia:  Reviewed DEXA scan records: DEXA scan (06/06/2013): L1-L4: -1.2 RFN: -1.9, LFN:  -1.7 FRAX: MOF 16.4%, Hip: 2.3%  DEXA scan - [facility] (06/18/2015): L1-L4: -1.9 RFN: -2.0, LFN: -2.1  FRAX: MOF 16%, Hip: 2.7% These appeared worse >> The vitamin D is now normal, and working on normalizing her TFTs.  I advised her to start weightbearing exercise and we decided to repeat another bone scan in 2 years. If worse >> start anti-resorptive medication.   ROS: Constitutional: no weight gain/no weight loss, no fatigue, no subjective hyperthermia, no subjective hypothermia Eyes: no blurry vision, no xerophthalmia ENT: no sore throat, no nodules palpated in throat, no dysphagia, no odynophagia, no hoarseness Cardiovascular: no CP/no SOB/no palpitations/no leg swelling Respiratory: no cough/no SOB/no wheezing Gastrointestinal: no N/no V/no D/no C/no acid reflux Musculoskeletal: no muscle aches/no joint aches Skin: no rashes, + hair loss Neurological: no tremors/no numbness/no tingling/no dizziness  I reviewed pt's medications, allergies, PMH, social hx, family hx, and changes were documented in the history of present illness. Otherwise, unchanged from my initial visit note. Past Medical History:  Diagnosis Date  . Allergy   . Anxiety   . Asthma   . Colon polyps   . Depression   . Diverticulosis   . Environmental allergies    Weekly allergy shots  . Esophageal stricture   . GERD (gastroesophageal reflux disease)   .  Hiatal hernia   . Hyperlipidemia   . Hypothyroidism   . IBS (irritable bowel syndrome)   . Osteopenia   . Osteoporosis    Past Surgical History:  Procedure Laterality Date  . ABDOMINAL HYSTERECTOMY    . BARTHOLIN GLAND CYST EXCISION    . BUNIONECTOMY Right   . CHOLECYSTECTOMY    . ESOPHAGOGASTRODUODENOSCOPY N/A 10/21/2012   Procedure: ESOPHAGOGASTRODUODENOSCOPY (EGD);  Surgeon: Beryle Beams, MD;  Location: [facility];  Service: Endoscopy;  Laterality: N/A;  . EXPLORATORY LAPAROTOMY     endometriosis  . facial cyst     excision  . NASAL SEPTUM  SURGERY    . thumb surgery Left    for cyst   History   Social History  . Marital Status: Single    Spouse Name: N/A    Number of Children: 1  . Years of Education: college   Occupational History  . caregiver    Social History Main Topics  . Smoking status: Never Smoker   . Smokeless tobacco: No  . Alcohol Use: No  . Drug Use: No   Current Outpatient Prescriptions on File Prior to Visit  Medication Sig Dispense Refill  . albuterol (PROVENTIL HFA;VENTOLIN HFA) 108 (90 Base) MCG/ACT inhaler Inhale into the lungs.    . cholecalciferol (VITAMIN D) 1000 units tablet Take 2,000 Units by mouth daily.    Vladimir Faster Glycol-Propyl Glycol (SYSTANE OP) Place 1 drop into both eyes daily.    . ranitidine (ZANTAC) 300 MG tablet TAKE 1 TABLET (300 MG TOTAL) BY MOUTH 2 (TWO) TIMES DAILY. (Patient taking differently: Take 300 mg by mouth 2 (two) times daily. TAKE 1 TABLET (300 MG TOTAL) BY MOUTH 2 (TWO) TIMES DAILY.) 60 tablet 5  . SYNTHROID 88 MCG tablet TAKE ONE TABLET BY MOUTH EVERY MORNING BEFORE BREAKFAST 45 tablet 0  . Triamcinolone Acetonide (NASACORT ALLERGY 24HR NA) Place 1 spray into the nose daily.     No current facility-administered medications on file prior to visit.    Allergies  Allergen Reactions  . Augmentin [Amoxicillin-Pot Clavulanate] Other (See Comments)    Stomach pain   . Crestor [Rosuvastatin]     Myalgias  . Demerol [Meperidine]   . Latex Other (See Comments)    Canker sores in mouth (discovered by dentist)  . Meperidine Hcl Other (See Comments)    Does not remember   . Morphine And Related Itching    Has tolerated small doses of hydrocodone  . Pentazocine Lactate Nausea And Vomiting    Sick on stomach  . Simvastatin Other (See Comments)    Muscle aches   . Talwin [Pentazocine]   . Codeine Rash    Has tolerated small doses of hydrocodone  . Morphine Rash   Family History  Problem Relation Age of Onset  . Lung cancer Mother   . Hypertension Mother   .  Skin cancer Father   . Dementia Father   . Heart disease Sister   . Thyroid disease Daughter   . Heart disease Maternal Grandfather   . Heart disease Paternal Grandmother   . Thyroid disease Mother   . Arrhythmia Father   . CAD Sister        MI/stent age 78, CABG age 34  . Heart disease Father        Several relatives on dad's side have had MIs/CABG  . Heart disease Mother   . Thyroid disease Sister   . Alzheimer's disease Father    PE: BP  122/70 (BP Location: Left Arm, Patient Position: Sitting)   Pulse 77   Wt 130 lb (59 kg)   SpO2 97%   BMI 23.40 kg/m  Body mass index is 23.4 kg/m. Wt Readings from Last 3 Encounters:  06/09/16 130 lb (59 kg)  12/11/15 125 lb 9.6 oz (57 kg)  08/05/15 125 lb 3 oz (56.8 kg)   Constitutional: normal weight, in NAD Eyes: PERRLA, EOMI, no exophthalmos ENT: moist mucous membranes, no thyromegaly, no cervical lymphadenopathy Cardiovascular: RRR, No MRG Respiratory: CTA B Gastrointestinal: abdomen soft, NT, ND, BS+ Musculoskeletal: no deformities, strength intact in all 4 Skin: moist, warm, no rashes Neurological: no tremor with outstretched hands, DTR normal in all 4  ASSESSMENT: 1. Hypothyroidism - Thyroid U/S (03/17/2013) - a nonenlarged thyroid, with very small hypoechoic regions:  Right lobe: 4.2 x 1.5 x 1.2 cm. There are multiple hypoechogenic to anechoic nodules, measuring under 2 mm in diameter  Left lobe: 3.9 x 1.3 x 1.5 cm. There are hypoechoic nodules in the mid pole and lower pole of the left thyroid lobe, with the largest nodule measuring 4 mm in diameter. There is a minimal 1 x 3 mm diameter shadowing calcification in the midpole >> no goiter, small hypoechoic areas, all <4 mm >> no follow up is needed  2. Osteopenia  3.  Vitamin D deficiency  PLAN:  1. Hypothyroidism - latest thyroid labs reviewed with pt >> normal  - she continues on LT4 100 mcg daily - pt feels good on this dose. - we discussed about taking the  thyroid hormone every day, with water, >30 minutes before breakfast, separated by >4 hours from acid reflux medications, calcium, iron, multivitamins. Pt. is taking it correctly. - will check thyroid tests today: TSH and fT4 - If labs are abnormal, she will need to return for repeat TFTs in 1.5 months - OTW, RTC in 6 mo  2. Osteopenia - had a rib fracture detected in 2016 - We reviewed her 2015 ans 2017 >> osteopenia worse on latest scan - will get the new DEXA scan report from [facility] >> will repeat in 2019 - until then: normalize TFTs, vitamin D and start exercise. I also suggested OsteoStrong skeletal loading.  3.  Vitamin D deficiency - on vitamin D 4000 units/day, but has missed doses - check level today  Component     Latest Ref Rng & Units 06/09/2016  TSH     0.35 - 4.50 uIU/mL 1.03  T4,Free(Direct)     0.60 - 1.60 ng/dL 1.08  VITD     30.00 - 100.00 ng/mL 27.42 (L)   TFTs are great. Vitamin D slightly low. I would encourage her to start taking the vitamin D at the same dose, daily.  Philemon Kingdom, MD PhD [facility] Endocrinology

## 2016-06-25 ENCOUNTER — Ambulatory Visit: Payer: Medicare Other | Admitting: Podiatry

## 2016-07-02 ENCOUNTER — Encounter: Payer: Self-pay | Admitting: Internal Medicine

## 2016-07-02 ENCOUNTER — Ambulatory Visit (INDEPENDENT_AMBULATORY_CARE_PROVIDER_SITE_OTHER): Payer: Medicare Other | Admitting: Internal Medicine

## 2016-07-02 VITALS — BP 110/60 | HR 84 | Wt 128.0 lb

## 2016-07-02 DIAGNOSIS — M542 Cervicalgia: Secondary | ICD-10-CM | POA: Diagnosis not present

## 2016-07-02 NOTE — Patient Instructions (Signed)
You will be called about the neck ultrasound.  Please come in as scheduled.

## 2016-07-02 NOTE — Progress Notes (Signed)
Patient ID: Mary Rubio, female   DOB: [birth year], 71 y.o.   MRN: [id number]   HPI  Mary Rubio is a 71 y.o.-year-old female, retruning for f/u for hypothyroidism, dx 1996, vitamin D deficiency, and osteopenia. Last visit 3 weeks ago. She returns sooner 2/2 neck pain.  She has R lower neck pain at palpation radiating to supraclav. fossa. She has discomfort, not pain, if not touching the area. Sometimes pain with swallowing. She is wondering whether the symptoms may come from her thyroid.   Pt is on Synthroid d.a.w. 88 mcg daily, taken: - in am - fasting - at least one hour from b'fast - no Ca, Fe, MVI, PPIs - Zantac later in the day  - not on Biotin  I reviewed pt's thyroid tests: Lab Results  Component Value Date   TSH 1.03 06/09/2016   TSH 0.48 03/05/2016   TSH 0.10 (L) 01/15/2016   TSH 0.90 04/11/2015   TSH 5.04 (H) 02/28/2015   TSH 6.675 (H) 11/28/2014   TSH 2.36 06/29/2014   TSH 1.84 02/27/2014   TSH 0.948 01/22/2014   TSH 0.68 10/19/2013   FREET4 1.08 06/09/2016   FREET4 1.00 03/05/2016   FREET4 1.43 01/15/2016   FREET4 1.18 04/11/2015   FREET4 0.94 02/28/2015   FREET4 1.09 11/28/2014   FREET4 0.85 06/29/2014   FREET4 0.99 02/27/2014   FREET4 1.86 (H) 01/22/2014   FREET4 1.10 10/19/2013  12/06/2015: TSH 0.103 08/17/2013: TSH 3.45, fT4 0.70 >> she was advised to increase the LT4 to 125 mcg, but she did not do it. She feels better on 112 mcg.  07/04/2013: TSH 6.81 >> dose of LT4 increased from 100 to 112 mcg  She had a h/o radiation tx to head or neck (for ear infection >> experimental tx - several tx's).  She also has a h/o vitamin D insufficiency: Lab Results  Component Value Date   VD25OH 27.42 (L) 06/09/2016   VD25OH 31.73 03/05/2016   VD25OH 29.41 (L) 01/15/2016   VD25OH 50.51 02/28/2015   VD25OH 23.35 (L) 07/24/2014   We started vit D supplementation (4000 IU daily) in Summer 2016. She was forgetting doses at last visit and her vitamin D  was slightly low. At this visit, she tells me that she is only taking 2000 units, and she still forgets doses...  She also has osteopenia:  Reviewed Her DEXA scans: DEXA scan (06/06/2013): L1-L4: -1.2 RFN: -1.9, LFN: -1.7 FRAX: MOF 16.4%, Hip: 2.3%  DEXA scan - [facility] (06/18/2015): L1-L4: -1.9 RFN: -2.0, LFN: -2.1  FRAX: MOF 16%, Hip: 2.7%  After the last scan, we discussed to normalize her vitamin D level, start weightbearing exercises and repeat her DEXA scan in 2019. If worse, start antiresorptive medicines.  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 Sister just dx'ed with Multiple Myeloma.  ROS: Constitutional:+ weight loss, no fatigue, no subjective hyperthermia, no subjective hypothermia Eyes: no blurry vision, no xerophthalmia ENT: no sore throat, no nodules palpated in throat, no dysphagia, no odynophagia, + hoarseness, + R neck pain Cardiovascular: no CP/+ SOB/no palpitations/no leg swelling Respiratory: no cough/+ SOB/no wheezing Gastrointestinal: + N/no V/no D/no C/no acid reflux Musculoskeletal: no muscle aches/no joint aches Skin: + rash, + hair loss Neurological: no tremors/no numbness/no tingling/no dizziness  I reviewed pt's medications, allergies, PMH, social hx, family hx, and changes were documented in the history of present illness. Otherwise, unchanged from my initial visit note.  Past Medical History:  Diagnosis Date  . Allergy   .  Anxiety   . Asthma   . Colon polyps   . Depression   . Diverticulosis   . Environmental allergies    Weekly allergy shots  . Esophageal stricture   . GERD (gastroesophageal reflux disease)   . Hiatal hernia   . Hyperlipidemia   . Hypothyroidism   . IBS (irritable bowel syndrome)   . Osteopenia   . Osteoporosis    Past Surgical History:  Procedure Laterality Date  . ABDOMINAL HYSTERECTOMY    . BARTHOLIN GLAND CYST EXCISION    . BUNIONECTOMY Right   . CHOLECYSTECTOMY    . ESOPHAGOGASTRODUODENOSCOPY N/A  10/21/2012   Procedure: ESOPHAGOGASTRODUODENOSCOPY (EGD);  Surgeon: Beryle Beams, MD;  Location: [facility];  Service: Endoscopy;  Laterality: N/A;  . EXPLORATORY LAPAROTOMY     endometriosis  . facial cyst     excision  . NASAL SEPTUM SURGERY    . thumb surgery Left    for cyst   History   Social History  . Marital Status: Single    Spouse Name: N/A    Number of Children: 1  . Years of Education: college   Occupational History  . caregiver    Social History Main Topics  . Smoking status: Never Smoker   . Smokeless tobacco: No  . Alcohol Use: No  . Drug Use: No   Current Outpatient Prescriptions on File Prior to Visit  Medication Sig Dispense Refill  . albuterol (PROVENTIL HFA;VENTOLIN HFA) 108 (90 Base) MCG/ACT inhaler Inhale into the lungs.    . cholecalciferol (VITAMIN D) 1000 units tablet Take 2,000 Units by mouth daily.    Vladimir Faster Glycol-Propyl Glycol (SYSTANE OP) Place 1 drop into both eyes daily.    . ranitidine (ZANTAC) 300 MG tablet TAKE 1 TABLET (300 MG TOTAL) BY MOUTH 2 (TWO) TIMES DAILY. (Patient taking differently: Take 300 mg by mouth 2 (two) times daily. TAKE 1 TABLET (300 MG TOTAL) BY MOUTH 2 (TWO) TIMES DAILY.) 60 tablet 5  . SYNTHROID 88 MCG tablet TAKE ONE TABLET BY MOUTH EVERY MORNING BEFORE BREAKFAST 45 tablet 0  . Triamcinolone Acetonide (NASACORT ALLERGY 24HR NA) Place 1 spray into the nose daily.     No current facility-administered medications on file prior to visit.    Allergies  Allergen Reactions  . Augmentin [Amoxicillin-Pot Clavulanate] Other (See Comments)    Stomach pain   . Crestor [Rosuvastatin]     Myalgias  . Demerol [Meperidine]   . Latex Other (See Comments)    Canker sores in mouth (discovered by dentist)  . Meperidine Hcl Other (See Comments)    Does not remember   . Morphine And Related Itching    Has tolerated small doses of hydrocodone  . Pentazocine Lactate Nausea And Vomiting    Sick on stomach  . Simvastatin  Other (See Comments)    Muscle aches   . Talwin [Pentazocine]   . Codeine Rash    Has tolerated small doses of hydrocodone  . Morphine Rash   Family History  Problem Relation Age of Onset  . Lung cancer Mother   . Hypertension Mother   . Skin cancer Father   . Dementia Father   . Heart disease Sister   . Thyroid disease Daughter   . Heart disease Maternal Grandfather   . Heart disease Paternal Grandmother   . Thyroid disease Mother   . Arrhythmia Father   . CAD Sister        MI/stent age 60, CABG age  52  . Heart disease Father        Several relatives on dad's side have had MIs/CABG  . Heart disease Mother   . Thyroid disease Sister   . Alzheimer's disease Father    PE: BP 110/60 (BP Location: Left Arm, Patient Position: Sitting)   Pulse 84   Wt 128 lb (58.1 kg)   SpO2 96%   BMI 23.04 kg/m  Body mass index is 23.04 kg/m. Wt Readings from Last 3 Encounters:  07/02/16 128 lb (58.1 kg)  06/09/16 130 lb (59 kg)  12/11/15 125 lb 9.6 oz (57 kg)   Constitutional: normal weight, in NAD Eyes: PERRLA, EOMI, no exophthalmos ENT: moist mucous membranes, no thyromegaly, no cervical lymphadenopathy; tympanic membranes appear opaque, without indication of middle ear fluid Cardiovascular: RRR, No MRG Respiratory: CTA B Gastrointestinal: abdomen soft, NT, ND, BS+ Musculoskeletal: no deformities, strength intact in all 4 Skin: moist, warm, no rashes Neurological: no tremor with outstretched hands, DTR normal in all 4  ASSESSMENT: 1. Hypothyroidism - Thyroid U/S (03/17/2013) - a nonenlarged thyroid, with very small hypoechoic regions:  Right lobe: 4.2 x 1.5 x 1.2 cm. There are multiple hypoechogenic to anechoic nodules, measuring under 2 mm in diameter  Left lobe: 3.9 x 1.3 x 1.5 cm. There are hypoechoic nodules in the mid pole and lower pole of the left thyroid lobe, with the largest nodule measuring 4 mm in diameter. There is a minimal 1 x 3 mm diameter shadowing calcification  in the midpole >> no goiter, small hypoechoic areas, all <4 mm >> no follow up is needed  2. Osteopenia  3.  Vitamin D deficiency  PLAN:  1. Hypothyroidism - latest thyroid labs reviewed with pt >> normal  - she continues on LT4 100 mcg daily - pt feels good on this dose. - we discussed about taking the thyroid hormone every day, with water, >30 minutes before breakfast, separated by >4 hours from acid reflux medications, calcium, iron, multivitamins. Pt. is taking it correctly  2. Osteopenia - had a rib fracture detected in 2016 - Comparing her DEXA scans from 2015 and 2017, osteopenia was worse on the latest scan - Will need a new scan in 2019 at [facility] - At last visit, I suggested OsteoStrong skeletal loading and I also suggested to normalize her vitamin D level    3.  Vitamin D deficiency - on vitamin 2000 units a day, but was missing doses at last visit and her level was slightly low  - again advised her to take this consistently  4. R neck pain - She does admit for postnasal drip lately - I reassured the patient that this is not appear to be coming from her thyroid, However, we can check a neck U/S to clarify. I suggested that she may need to see an ENT doctor if the pain continues  I will addend the results of the neck ultrasound when they become available.  Philemon Kingdom, MD PhD [facility] Endocrinology

## 2016-07-07 DIAGNOSIS — H40023 Open angle with borderline findings, high risk, bilateral: Secondary | ICD-10-CM | POA: Diagnosis not present

## 2016-07-10 ENCOUNTER — Ambulatory Visit
Admission: RE | Admit: 2016-07-10 | Discharge: 2016-07-10 | Disposition: A | Discharge status: Home / Self Care | Payer: Medicare Other | Source: Ambulatory Visit | Attending: Internal Medicine | Admitting: Internal Medicine

## 2016-07-10 DIAGNOSIS — E042 Nontoxic multinodular goiter: Secondary | ICD-10-CM | POA: Diagnosis not present

## 2016-07-13 ENCOUNTER — Telehealth: Payer: Self-pay | Admitting: Internal Medicine

## 2016-07-13 NOTE — Telephone Encounter (Signed)
Patient would like a call regarding her ultrasound results as soon as possible. Call patient to advise.

## 2016-07-13 NOTE — Telephone Encounter (Signed)
Having MD to look over [place] results.

## 2016-07-16 NOTE — Telephone Encounter (Signed)
Patient was sent a my chart message.

## 2016-09-11 ENCOUNTER — Other Ambulatory Visit: Payer: Self-pay | Admitting: Internal Medicine

## 2016-09-23 DIAGNOSIS — H40023 Open angle with borderline findings, high risk, bilateral: Secondary | ICD-10-CM | POA: Diagnosis not present

## 2016-09-23 DIAGNOSIS — H40053 Ocular hypertension, bilateral: Secondary | ICD-10-CM | POA: Diagnosis not present

## 2016-09-29 DIAGNOSIS — M79661 Pain in right lower leg: Secondary | ICD-10-CM | POA: Diagnosis not present

## 2016-09-29 DIAGNOSIS — M545 Low back pain: Secondary | ICD-10-CM | POA: Diagnosis not present

## 2016-09-29 DIAGNOSIS — M79662 Pain in left lower leg: Secondary | ICD-10-CM | POA: Diagnosis not present

## 2016-09-29 DIAGNOSIS — M7062 Trochanteric bursitis, left hip: Secondary | ICD-10-CM | POA: Diagnosis not present

## 2016-10-24 DIAGNOSIS — Z23 Encounter for immunization: Secondary | ICD-10-CM | POA: Diagnosis not present

## 2016-11-03 ENCOUNTER — Other Ambulatory Visit: Payer: Self-pay | Admitting: Internal Medicine

## 2016-11-19 DIAGNOSIS — J0101 Acute recurrent maxillary sinusitis: Secondary | ICD-10-CM | POA: Diagnosis not present

## 2016-11-19 DIAGNOSIS — J069 Acute upper respiratory infection, unspecified: Secondary | ICD-10-CM | POA: Diagnosis not present

## 2016-12-14 ENCOUNTER — Encounter: Payer: Self-pay | Admitting: Internal Medicine

## 2016-12-14 ENCOUNTER — Other Ambulatory Visit: Payer: Self-pay | Admitting: Internal Medicine

## 2016-12-14 ENCOUNTER — Ambulatory Visit (INDEPENDENT_AMBULATORY_CARE_PROVIDER_SITE_OTHER): Payer: Medicare Other | Admitting: Internal Medicine

## 2016-12-14 VITALS — BP 96/58 | HR 53 | Wt 129.0 lb

## 2016-12-14 DIAGNOSIS — Z833 Family history of diabetes mellitus: Secondary | ICD-10-CM

## 2016-12-14 DIAGNOSIS — E039 Hypothyroidism, unspecified: Secondary | ICD-10-CM | POA: Diagnosis not present

## 2016-12-14 DIAGNOSIS — E559 Vitamin D deficiency, unspecified: Secondary | ICD-10-CM

## 2016-12-14 DIAGNOSIS — M8580 Other specified disorders of bone density and structure, unspecified site: Secondary | ICD-10-CM

## 2016-12-14 LAB — HEMOGLOBIN A1C: Hgb A1c MFr Bld: 5.5 % (ref 4.6–6.5)

## 2016-12-14 LAB — T4, FREE: Free T4: 0.92 ng/dL (ref 0.60–1.60)

## 2016-12-14 LAB — VITAMIN D 25 HYDROXY (VIT D DEFICIENCY, FRACTURES): VITD: 34.94 ng/mL (ref 30.00–100.00)

## 2016-12-14 LAB — TSH: TSH: 3.79 u[IU]/mL (ref 0.35–4.50)

## 2016-12-14 NOTE — Progress Notes (Signed)
Patient ID: Mary Rubio, female   DOB: 1945/05/14, 71 y.o.   MRN: [id number]   HPI  Mary Rubio is a 71 y.o.-year-old female, retruning for f/u for hypothyroidism, dx 1996, vitamin D deficiency, and osteopenia. Last visit 5.5 months ago.  At last visit, she had R lower neck pain at palpation radiating to supraclav. fossa.  She had a thyroid ultrasound in 06/2016 which did not show any significant pathology.  She has a cough. Over the last year, she had severe URIs.  Pt is on Synthroid d.a.w. 88 mcg daily, taken: - in am - fasting - at least 1h from b'fast - no Ca, Fe, MVI, PPIs - Takes Zantac later in the day - not on Biotin  I reviewed pt's thyroid tests: Lab Results  Component Value Date   TSH 1.03 06/09/2016   TSH 0.48 03/05/2016   TSH 0.10 (L) 01/15/2016   TSH 0.90 04/11/2015   TSH 5.04 (H) 02/28/2015   TSH 6.675 (H) 11/28/2014   TSH 2.36 06/29/2014   TSH 1.84 02/27/2014   TSH 0.948 01/22/2014   TSH 0.68 10/19/2013   FREET4 1.08 06/09/2016   FREET4 1.00 03/05/2016   FREET4 1.43 01/15/2016   FREET4 1.18 04/11/2015   FREET4 0.94 02/28/2015   FREET4 1.09 11/28/2014   FREET4 0.85 06/29/2014   FREET4 0.99 02/27/2014   FREET4 1.86 (H) 01/22/2014   FREET4 1.10 10/19/2013  12/06/2015: TSH 0.103 08/17/2013: TSH 3.45, fT4 0.70 >> she was advised to increase the LT4 to 125 mcg, but she did not do it. She feels better on 112 mcg.  07/04/2013: TSH 6.81 >> dose of LT4 increased from 100 to 112 mcg  She had a h/o radiation tx to head or neck (for ear infection >> experimental tx - several tx's).  She also has a h/o vitamin D insufficiency: Lab Results  Component Value Date   VD25OH 27.42 (L) 06/09/2016   VD25OH 31.73 03/05/2016   VD25OH 29.41 (L) 01/15/2016   VD25OH 50.51 02/28/2015   VD25OH 23.35 (L) 07/24/2014   We started vit D supplementation (4000 IU daily) in Summer 2016. She forgets it 5x a week!  She also has osteopenia:  Reviewed Her DEXA  scans: DEXA scan (06/06/2013): L1-L4: -1.2 RFN: -1.9, LFN: -1.7 FRAX: MOF 16.4%, Hip: 2.3%  DEXA scan - [facility] (06/18/2015): L1-L4: -1.9 RFN: -2.0, LFN: -2.1  FRAX: MOF 16%, Hip: 2.7%  After the DEXA scan from 2017, we discussed to normalize her vitamin D level, start weightbearing exercises and repeat the scan in 2019.  If worse, we need to start antiresorptive medicines.  Her father died in 2016/09/02 >> he was in hospice. Sister has Multiple Myeloma.  ROS: Constitutional: no weight gain/no weight loss, no fatigue, no subjective hyperthermia, + subjective hypothermia Eyes: no blurry vision, no xerophthalmia ENT: no sore throat, no nodules palpated in throat, no dysphagia, no odynophagia, + hoarseness Cardiovascular: + CP (w/ cough)/no SOB/no palpitations/no leg swelling Respiratory: + cough/no SOB/no wheezing Gastrointestinal: no N/no V/no D/no C/no acid reflux Musculoskeletal: no muscle aches/+ joint aches (hands, hip) Skin: no rashes, no hair loss Neurological: no tremors/no numbness/no tingling/no dizziness  I reviewed pt's medications, allergies, PMH, social hx, family hx, and changes were documented in the history of present illness. Otherwise, unchanged from my initial visit note.  Past Medical History:  Diagnosis Date  . Allergy   . Anxiety   . Asthma   . Colon polyps   . Depression   . Diverticulosis   .  Environmental allergies    Weekly allergy shots  . Esophageal stricture   . GERD (gastroesophageal reflux disease)   . Hiatal hernia   . Hyperlipidemia   . Hypothyroidism   . IBS (irritable bowel syndrome)   . Osteopenia   . Osteoporosis    Past Surgical History:  Procedure Laterality Date  . ABDOMINAL HYSTERECTOMY    . BARTHOLIN GLAND CYST EXCISION    . BUNIONECTOMY Right   . CHOLECYSTECTOMY    . ESOPHAGOGASTRODUODENOSCOPY N/A 10/21/2012   Procedure: ESOPHAGOGASTRODUODENOSCOPY (EGD);  Surgeon: Beryle Beams, MD;  Location: [facility];  Service:  Endoscopy;  Laterality: N/A;  . EXPLORATORY LAPAROTOMY     endometriosis  . facial cyst     excision  . NASAL SEPTUM SURGERY    . thumb surgery Left    for cyst   History   Social History  . Marital Status: Single    Spouse Name: N/A    Number of Children: 1  . Years of Education: college   Occupational History  . caregiver    Social History Main Topics  . Smoking status: Never Smoker   . Smokeless tobacco: No  . Alcohol Use: No  . Drug Use: No   Current Outpatient Medications on File Prior to Visit  Medication Sig Dispense Refill  . albuterol (PROVENTIL HFA;VENTOLIN HFA) 108 (90 Base) MCG/ACT inhaler Inhale into the lungs.    . cholecalciferol (VITAMIN D) 1000 units tablet Take 2,000 Units by mouth daily.    Vladimir Faster Glycol-Propyl Glycol (SYSTANE OP) Place 1 drop into both eyes daily.    . ranitidine (ZANTAC) 300 MG tablet TAKE 1 TABLET (300 MG TOTAL) BY MOUTH 2 (TWO) TIMES DAILY. (Patient taking differently: Take 300 mg by mouth 2 (two) times daily. TAKE 1 TABLET (300 MG TOTAL) BY MOUTH 2 (TWO) TIMES DAILY.) 60 tablet 5  . SYNTHROID 88 MCG tablet TAKE ONE TABLET BY MOUTH EVERY MORNING BEFORE BREAKFAST 45 tablet 0  . Triamcinolone Acetonide (NASACORT ALLERGY 24HR NA) Place 1 spray into the nose daily.     No current facility-administered medications on file prior to visit.    Allergies  Allergen Reactions  . Augmentin [Amoxicillin-Pot Clavulanate] Other (See Comments)    Stomach pain   . Crestor [Rosuvastatin]     Myalgias  . Demerol [Meperidine]   . Latex Other (See Comments)    Canker sores in mouth (discovered by dentist)  . Meperidine Hcl Other (See Comments)    Does not remember   . Morphine And Related Itching    Has tolerated small doses of hydrocodone  . Pentazocine Lactate Nausea And Vomiting    Sick on stomach  . Simvastatin Other (See Comments)    Muscle aches   . Talwin [Pentazocine]   . Codeine Rash    Has tolerated small doses of hydrocodone   . Morphine Rash   Family History  Problem Relation Age of Onset  . Lung cancer Mother   . Hypertension Mother   . Skin cancer Father   . Dementia Father   . Heart disease Sister   . Thyroid disease Daughter   . Heart disease Maternal Grandfather   . Heart disease Paternal Grandmother   . Thyroid disease Mother   . Arrhythmia Father   . CAD Sister        MI/stent age 49, CABG age 5  . Heart disease Father        Several relatives on dad's side have had MIs/CABG  .  Heart disease Mother   . Thyroid disease Sister   . Alzheimer's disease Father    PE: BP (!) 96/58   Pulse (!) 53   Wt 129 lb (58.5 kg)   SpO2 96%   BMI 23.22 kg/m  Body mass index is 23.22 kg/m. Wt Readings from Last 3 Encounters:  12/14/16 129 lb (58.5 kg)  07/02/16 128 lb (58.1 kg)  06/09/16 130 lb (59 kg)   Constitutional: normal weight, in NAD Eyes: PERRLA, EOMI, no exophthalmos ENT: moist mucous membranes, no thyromegaly, no cervical lymphadenopathy Cardiovascular: bradycardia, RR, No MRG Respiratory: CTA B Gastrointestinal: abdomen soft, NT, ND, BS+ Musculoskeletal: no deformities, strength intact in all 4 Skin: moist, warm, no rashes Neurological: no tremor with outstretched hands, DTR normal in all 4  ASSESSMENT: 1. Hypothyroidism - Thyroid U/S (03/17/2013) - a nonenlarged thyroid, with very small hypoechoic regions:  Right lobe: 4.2 x 1.5 x 1.2 cm. There are multiple hypoechogenic to anechoic nodules, measuring under 2 mm in diameter  Left lobe: 3.9 x 1.3 x 1.5 cm. There are hypoechoic nodules in the mid pole and lower pole of the left thyroid lobe, with the largest nodule measuring 4 mm in diameter. There is a minimal 1 x 3 mm diameter shadowing calcification in the midpole >> no goiter, small hypoechoic areas, all <4 mm >> no follow up is needed  - Thyroid U/S (07/10/2016) - Nodule # 1: Location: Left; Mid Maximum size: 0.4 cm; Other 2 dimensions: 0.2 cm x 0.4 cm Composition: cannot  determine (2) Echogenicity: hyperechoic (1) Echogenic foci: macrocalcifications (1)  ACR TI-RADS recommendations: Nodule does not meet criteria for surveillance or biopsy _______________________________________________ No adenopathy No thyroid nodule meets criteria for biopsy or surveillance, as designated by the newly established ACR TI-RADS criteria.  2. Osteopenia  3.  Vitamin D deficiency  4.  Family history of diabetes  PLAN:  1. Hypothyroidism - latest thyroid labs reviewed with pt >> normal  - she continues on LT4 DAW 88 mcg daily - pt feels good on this dose. - we discussed about taking the thyroid hormone every day, with water, >30 minutes before breakfast, separated by >4 hours from acid reflux medications, calcium, iron, multivitamins. Pt. is taking it correctly - will check thyroid tests today: TSH and fT4 - If labs are abnormal, she will need to return for repeat TFTs in 1.5 months - OTW, RTC in 6 mo  2. Osteopenia - She had a rib fracture detected in 2016.   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  Vitamin D deficiency - on vitamin 4000 units a day, but she is not compliant with her supplement.  Upon questioning, she would prefer to take a weekly dose.  We will check the level today and will start 50,000 units once a week if vitamin D is still low.  4. She has a family history of diabetes in her sister and had more sugar cravings lately.  We will check an HbA1c.  Orders Placed This Encounter  Procedures  . TSH  . T4, free  . VITAMIN D 25 Hydroxy (Vit-D Deficiency, Fractures)  . Hemoglobin A1c   Component     Latest  Ref Rng & Units 12/14/2016          TSH     0.35 - 4.50  uIU/mL 3.79  T4,Free(Direct)     0.60 - 1.60 ng/dL 0.92  Hemoglobin A1C     4.6 - 6.5 % 5.5  VITD     30.00 - 100.00 ng/mL 34.94  Labs are all normal. Philemon Kingdom, MD PhD [facility] Endocr[facility]

## 2016-12-14 NOTE — Patient Instructions (Signed)
Please stop at the lab.  Please continue Synthroid 88 mcg daily.  Take the thyroid hormone every day, with water, at least 30 minutes before breakfast, separated by at least 4 hours from: - acid reflux medications - calcium - iron - multivitamins  Please come back for a follow-up appointment in 6 months.

## 2016-12-15 MED ORDER — SYNTHROID 88 MCG PO TABS: 88.0000 ug | ORAL_TABLET | Freq: Every day | ORAL | 3 refills | Status: DC

## 2016-12-15 NOTE — Telephone Encounter (Signed)
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

## 2016-12-15 NOTE — Telephone Encounter (Signed)
[employer]

## 2017-01-04 ENCOUNTER — Encounter: Payer: Self-pay | Admitting: Internal Medicine

## 2017-01-08 DIAGNOSIS — J301 Allergic rhinitis due to pollen: Secondary | ICD-10-CM | POA: Diagnosis not present

## 2017-01-08 DIAGNOSIS — H1045 Other chronic allergic conjunctivitis: Secondary | ICD-10-CM | POA: Diagnosis not present

## 2017-01-08 DIAGNOSIS — J3089 Other allergic rhinitis: Secondary | ICD-10-CM | POA: Diagnosis not present

## 2017-01-08 DIAGNOSIS — B999 Unspecified infectious disease: Secondary | ICD-10-CM | POA: Diagnosis not present

## 2017-01-08 DIAGNOSIS — R0602 Shortness of breath: Secondary | ICD-10-CM | POA: Diagnosis not present

## 2017-01-27 DIAGNOSIS — H40023 Open angle with borderline findings, high risk, bilateral: Secondary | ICD-10-CM | POA: Diagnosis not present

## 2017-01-27 DIAGNOSIS — H40053 Ocular hypertension, bilateral: Secondary | ICD-10-CM | POA: Diagnosis not present

## 2017-01-27 DIAGNOSIS — Z83511 Family history of glaucoma: Secondary | ICD-10-CM | POA: Diagnosis not present

## 2017-02-05 ENCOUNTER — Telehealth: Payer: Self-pay

## 2017-02-05 DIAGNOSIS — N644 Mastodynia: Secondary | ICD-10-CM | POA: Diagnosis not present

## 2017-02-05 DIAGNOSIS — K449 Diaphragmatic hernia without obstruction or gangrene: Secondary | ICD-10-CM | POA: Diagnosis not present

## 2017-02-05 DIAGNOSIS — F4310 Post-traumatic stress disorder, unspecified: Secondary | ICD-10-CM | POA: Diagnosis not present

## 2017-02-05 DIAGNOSIS — E039 Hypothyroidism, unspecified: Secondary | ICD-10-CM | POA: Diagnosis not present

## 2017-02-05 DIAGNOSIS — I700 Atherosclerosis of aorta: Secondary | ICD-10-CM | POA: Diagnosis not present

## 2017-02-05 DIAGNOSIS — K589 Irritable bowel syndrome without diarrhea: Secondary | ICD-10-CM | POA: Diagnosis not present

## 2017-02-05 DIAGNOSIS — E782 Mixed hyperlipidemia: Secondary | ICD-10-CM | POA: Diagnosis not present

## 2017-02-05 DIAGNOSIS — E559 Vitamin D deficiency, unspecified: Secondary | ICD-10-CM | POA: Diagnosis not present

## 2017-02-05 DIAGNOSIS — J45909 Unspecified asthma, uncomplicated: Secondary | ICD-10-CM | POA: Diagnosis not present

## 2017-02-05 DIAGNOSIS — M8588 Other specified disorders of bone density and structure, other site: Secondary | ICD-10-CM | POA: Diagnosis not present

## 2017-02-05 DIAGNOSIS — R0609 Other forms of dyspnea: Secondary | ICD-10-CM | POA: Diagnosis not present

## 2017-02-05 DIAGNOSIS — E7800 Pure hypercholesterolemia, unspecified: Secondary | ICD-10-CM | POA: Diagnosis not present

## 2017-02-05 NOTE — Telephone Encounter (Signed)
Referral sent to scheduling. 

## 2017-02-18 DIAGNOSIS — N644 Mastodynia: Secondary | ICD-10-CM | POA: Diagnosis not present

## 2017-02-18 DIAGNOSIS — L299 Pruritus, unspecified: Secondary | ICD-10-CM | POA: Diagnosis not present

## 2017-02-18 DIAGNOSIS — R928 Other abnormal and inconclusive findings on diagnostic imaging of breast: Secondary | ICD-10-CM | POA: Diagnosis not present

## 2017-03-03 DIAGNOSIS — D225 Melanocytic nevi of trunk: Secondary | ICD-10-CM | POA: Diagnosis not present

## 2017-03-03 DIAGNOSIS — D2261 Melanocytic nevi of right upper limb, including shoulder: Secondary | ICD-10-CM | POA: Diagnosis not present

## 2017-03-03 DIAGNOSIS — L720 Epidermal cyst: Secondary | ICD-10-CM | POA: Diagnosis not present

## 2017-03-03 DIAGNOSIS — L853 Xerosis cutis: Secondary | ICD-10-CM | POA: Diagnosis not present

## 2017-03-03 DIAGNOSIS — L570 Actinic keratosis: Secondary | ICD-10-CM | POA: Diagnosis not present

## 2017-03-03 DIAGNOSIS — D2271 Melanocytic nevi of right lower limb, including hip: Secondary | ICD-10-CM | POA: Diagnosis not present

## 2017-03-03 DIAGNOSIS — L821 Other seborrheic keratosis: Secondary | ICD-10-CM | POA: Diagnosis not present

## 2017-03-04 DIAGNOSIS — J3089 Other allergic rhinitis: Secondary | ICD-10-CM | POA: Diagnosis not present

## 2017-03-04 DIAGNOSIS — J301 Allergic rhinitis due to pollen: Secondary | ICD-10-CM | POA: Diagnosis not present

## 2017-03-10 DIAGNOSIS — J301 Allergic rhinitis due to pollen: Secondary | ICD-10-CM | POA: Diagnosis not present

## 2017-03-10 DIAGNOSIS — J3089 Other allergic rhinitis: Secondary | ICD-10-CM | POA: Diagnosis not present

## 2017-03-12 DIAGNOSIS — J3089 Other allergic rhinitis: Secondary | ICD-10-CM | POA: Diagnosis not present

## 2017-03-12 DIAGNOSIS — J301 Allergic rhinitis due to pollen: Secondary | ICD-10-CM | POA: Diagnosis not present

## 2017-03-16 DIAGNOSIS — J301 Allergic rhinitis due to pollen: Secondary | ICD-10-CM | POA: Diagnosis not present

## 2017-03-16 DIAGNOSIS — J3089 Other allergic rhinitis: Secondary | ICD-10-CM | POA: Diagnosis not present

## 2017-03-18 DIAGNOSIS — J301 Allergic rhinitis due to pollen: Secondary | ICD-10-CM | POA: Diagnosis not present

## 2017-03-18 DIAGNOSIS — J3089 Other allergic rhinitis: Secondary | ICD-10-CM | POA: Diagnosis not present

## 2017-03-23 DIAGNOSIS — J3089 Other allergic rhinitis: Secondary | ICD-10-CM | POA: Diagnosis not present

## 2017-03-23 DIAGNOSIS — J301 Allergic rhinitis due to pollen: Secondary | ICD-10-CM | POA: Diagnosis not present

## 2017-03-25 DIAGNOSIS — J301 Allergic rhinitis due to pollen: Secondary | ICD-10-CM | POA: Diagnosis not present

## 2017-03-25 DIAGNOSIS — J3089 Other allergic rhinitis: Secondary | ICD-10-CM | POA: Diagnosis not present

## 2017-03-30 DIAGNOSIS — J301 Allergic rhinitis due to pollen: Secondary | ICD-10-CM | POA: Diagnosis not present

## 2017-03-30 DIAGNOSIS — J3089 Other allergic rhinitis: Secondary | ICD-10-CM | POA: Diagnosis not present

## 2017-03-31 DIAGNOSIS — B029 Zoster without complications: Secondary | ICD-10-CM | POA: Diagnosis not present

## 2017-04-06 DIAGNOSIS — J3089 Other allergic rhinitis: Secondary | ICD-10-CM | POA: Diagnosis not present

## 2017-04-06 DIAGNOSIS — J301 Allergic rhinitis due to pollen: Secondary | ICD-10-CM | POA: Diagnosis not present

## 2017-04-08 DIAGNOSIS — J3089 Other allergic rhinitis: Secondary | ICD-10-CM | POA: Diagnosis not present

## 2017-04-08 DIAGNOSIS — J301 Allergic rhinitis due to pollen: Secondary | ICD-10-CM | POA: Diagnosis not present

## 2017-04-13 DIAGNOSIS — J3089 Other allergic rhinitis: Secondary | ICD-10-CM | POA: Diagnosis not present

## 2017-04-13 DIAGNOSIS — J301 Allergic rhinitis due to pollen: Secondary | ICD-10-CM | POA: Diagnosis not present

## 2017-04-15 DIAGNOSIS — J3089 Other allergic rhinitis: Secondary | ICD-10-CM | POA: Diagnosis not present

## 2017-04-15 DIAGNOSIS — J301 Allergic rhinitis due to pollen: Secondary | ICD-10-CM | POA: Diagnosis not present

## 2017-04-20 DIAGNOSIS — J3089 Other allergic rhinitis: Secondary | ICD-10-CM | POA: Diagnosis not present

## 2017-04-20 DIAGNOSIS — J301 Allergic rhinitis due to pollen: Secondary | ICD-10-CM | POA: Diagnosis not present

## 2017-04-22 DIAGNOSIS — J301 Allergic rhinitis due to pollen: Secondary | ICD-10-CM | POA: Diagnosis not present

## 2017-04-22 DIAGNOSIS — J3089 Other allergic rhinitis: Secondary | ICD-10-CM | POA: Diagnosis not present

## 2017-04-27 DIAGNOSIS — J3089 Other allergic rhinitis: Secondary | ICD-10-CM | POA: Diagnosis not present

## 2017-04-27 DIAGNOSIS — J301 Allergic rhinitis due to pollen: Secondary | ICD-10-CM | POA: Diagnosis not present

## 2017-04-28 DIAGNOSIS — H40023 Open angle with borderline findings, high risk, bilateral: Secondary | ICD-10-CM | POA: Diagnosis not present

## 2017-05-03 DIAGNOSIS — J3089 Other allergic rhinitis: Secondary | ICD-10-CM | POA: Diagnosis not present

## 2017-05-03 DIAGNOSIS — J301 Allergic rhinitis due to pollen: Secondary | ICD-10-CM | POA: Diagnosis not present

## 2017-05-10 DIAGNOSIS — J3089 Other allergic rhinitis: Secondary | ICD-10-CM | POA: Diagnosis not present

## 2017-05-10 DIAGNOSIS — J301 Allergic rhinitis due to pollen: Secondary | ICD-10-CM | POA: Diagnosis not present

## 2017-05-13 DIAGNOSIS — J3089 Other allergic rhinitis: Secondary | ICD-10-CM | POA: Diagnosis not present

## 2017-05-13 DIAGNOSIS — J301 Allergic rhinitis due to pollen: Secondary | ICD-10-CM | POA: Diagnosis not present

## 2017-05-16 DIAGNOSIS — M940 Chondrocostal junction syndrome [Tietze]: Secondary | ICD-10-CM | POA: Diagnosis not present

## 2017-05-21 DIAGNOSIS — J301 Allergic rhinitis due to pollen: Secondary | ICD-10-CM | POA: Diagnosis not present

## 2017-05-21 DIAGNOSIS — R0789 Other chest pain: Secondary | ICD-10-CM | POA: Diagnosis not present

## 2017-05-21 DIAGNOSIS — J3089 Other allergic rhinitis: Secondary | ICD-10-CM | POA: Diagnosis not present

## 2017-05-26 DIAGNOSIS — J3089 Other allergic rhinitis: Secondary | ICD-10-CM | POA: Diagnosis not present

## 2017-05-26 DIAGNOSIS — J301 Allergic rhinitis due to pollen: Secondary | ICD-10-CM | POA: Diagnosis not present

## 2017-06-03 DIAGNOSIS — I700 Atherosclerosis of aorta: Secondary | ICD-10-CM | POA: Diagnosis not present

## 2017-06-03 DIAGNOSIS — J3089 Other allergic rhinitis: Secondary | ICD-10-CM | POA: Diagnosis not present

## 2017-06-03 DIAGNOSIS — R0789 Other chest pain: Secondary | ICD-10-CM | POA: Diagnosis not present

## 2017-06-03 DIAGNOSIS — J301 Allergic rhinitis due to pollen: Secondary | ICD-10-CM | POA: Diagnosis not present

## 2017-06-07 ENCOUNTER — Emergency Department (HOSPITAL_BASED_OUTPATIENT_CLINIC_OR_DEPARTMENT_OTHER)
Admission: EM | Admit: 2017-06-07 | Discharge: 2017-06-07 | Disposition: A | Discharge status: Home / Self Care | Payer: Medicare Other | Attending: Emergency Medicine | Admitting: Emergency Medicine

## 2017-06-07 ENCOUNTER — Emergency Department (HOSPITAL_BASED_OUTPATIENT_CLINIC_OR_DEPARTMENT_OTHER): Payer: Medicare Other

## 2017-06-07 ENCOUNTER — Other Ambulatory Visit: Payer: Self-pay

## 2017-06-07 ENCOUNTER — Encounter (HOSPITAL_BASED_OUTPATIENT_CLINIC_OR_DEPARTMENT_OTHER): Payer: Self-pay | Admitting: Respiratory Therapy

## 2017-06-07 DIAGNOSIS — R0600 Dyspnea, unspecified: Secondary | ICD-10-CM | POA: Diagnosis not present

## 2017-06-07 DIAGNOSIS — R110 Nausea: Secondary | ICD-10-CM | POA: Diagnosis not present

## 2017-06-07 DIAGNOSIS — Z79899 Other long term (current) drug therapy: Secondary | ICD-10-CM | POA: Diagnosis not present

## 2017-06-07 DIAGNOSIS — R0602 Shortness of breath: Secondary | ICD-10-CM | POA: Insufficient documentation

## 2017-06-07 DIAGNOSIS — J45909 Unspecified asthma, uncomplicated: Secondary | ICD-10-CM | POA: Diagnosis not present

## 2017-06-07 DIAGNOSIS — R079 Chest pain, unspecified: Secondary | ICD-10-CM | POA: Diagnosis not present

## 2017-06-07 DIAGNOSIS — R05 Cough: Secondary | ICD-10-CM | POA: Diagnosis not present

## 2017-06-07 DIAGNOSIS — E039 Hypothyroidism, unspecified: Secondary | ICD-10-CM | POA: Diagnosis not present

## 2017-06-07 DIAGNOSIS — Z91040 Latex allergy status: Secondary | ICD-10-CM | POA: Diagnosis not present

## 2017-06-07 DIAGNOSIS — R5383 Other fatigue: Secondary | ICD-10-CM | POA: Diagnosis not present

## 2017-06-07 LAB — COMPREHENSIVE METABOLIC PANEL
ALT: 13 U/L — ABNORMAL LOW (ref 14–54)
AST: 20 U/L (ref 15–41)
Albumin: 4.5 g/dL (ref 3.5–5.0)
Alkaline Phosphatase: 63 U/L (ref 38–126)
Anion gap: 8 (ref 5–15)
BUN: 13 mg/dL (ref 6–20)
CO2: 24 mmol/L (ref 22–32)
Calcium: 9.5 mg/dL (ref 8.9–10.3)
Chloride: 106 mmol/L (ref 101–111)
Creatinine, Ser: 0.95 mg/dL (ref 0.44–1.00)
GFR calc Af Amer: >60 mL/min (ref >60)
GFR calc non Af Amer: 60 mL/min — ABNORMAL LOW (ref >60)
Glucose, Bld: 122 mg/dL — ABNORMAL HIGH (ref 65–99)
Potassium: 3.5 mmol/L (ref 3.5–5.1)
Sodium: 138 mmol/L (ref 135–145)
Total Bilirubin: 0.4 mg/dL (ref 0.3–1.2)
Total Protein: 8 g/dL (ref 6.5–8.1)

## 2017-06-07 LAB — CBC WITH DIFFERENTIAL/PLATELET
Basophils Absolute: 0 10*3/uL (ref 0.0–0.1)
Basophils Relative: 1 %
Eosinophils Absolute: 0.2 10*3/uL (ref 0.0–0.7)
Eosinophils Relative: 3 %
HCT: 41.3 % (ref 36.0–46.0)
Hemoglobin: 14.8 g/dL (ref 12.0–15.0)
Lymphocytes Relative: 31 %
Lymphs Abs: 2 10*3/uL (ref 0.7–4.0)
MCH: 32.5 pg (ref 26.0–34.0)
MCHC: 35.8 g/dL (ref 30.0–36.0)
MCV: 90.6 fL (ref 78.0–100.0)
Monocytes Absolute: 0.6 10*3/uL (ref 0.1–1.0)
Monocytes Relative: 9 %
Neutro Abs: 3.6 10*3/uL (ref 1.7–7.7)
Neutrophils Relative %: 56 %
Platelets: 215 10*3/uL (ref 150–400)
RBC: 4.56 MIL/uL (ref 3.87–5.11)
RDW: 12.2 % (ref 11.5–15.5)
WBC: 6.4 10*3/uL (ref 4.0–10.5)

## 2017-06-07 LAB — TROPONIN I: Troponin I: <0.03 ng/mL (ref <0.03)

## 2017-06-07 LAB — INFLUENZA PANEL BY PCR (TYPE A & B)
Influenza A By PCR: NEGATIVE
Influenza B By PCR: NEGATIVE

## 2017-06-07 LAB — LIPASE, BLOOD: Lipase: 36 U/L (ref 11–51)

## 2017-06-07 MED ORDER — IOPAMIDOL (ISOVUE-370) INJECTION 76%
100.0000 mL | Freq: Once | INTRAVENOUS | Status: AC | PRN
  Administered 2017-06-07: 100 mL via INTRAVENOUS

## 2017-06-07 MED ORDER — ONDANSETRON HCL 4 MG/2ML IJ SOLN
4.0000 mg | Freq: Once | INTRAMUSCULAR | Status: AC
  Administered 2017-06-07: 4 mg via INTRAVENOUS
  Filled 2017-06-07: qty 2

## 2017-06-07 MED ORDER — SODIUM CHLORIDE 0.9 % IV SOLN
INTRAVENOUS | Status: DC
  Administered 2017-06-07: 16:00:00 via INTRAVENOUS

## 2017-06-07 NOTE — ED Triage Notes (Signed)
Pt states she has broken ribs on right side.  Pt states she is not getting better.  Difficulty with taking deep breath.  Pt states she has a non productive cough.  Increased lethargy.  No known fever.  HR 125 in triage.

## 2017-06-07 NOTE — ED Notes (Signed)
Pt ambulate on room air with pulse ox pt ranged 100-98% O2

## 2017-06-07 NOTE — ED Provider Notes (Signed)
[facility] EMERGENCY DEPARTMENT Provider Note   CSN: [id number] Arrival date & time: 06/07/17  1451     History   Chief Complaint Chief Complaint  Patient presents with  . Shortness of Breath    HPI Mary Rubio is a 72 y.o. female.  Approximately 4 weeks ago patient was coughing and had a popping sensation to her ribs.  Had x-ray studies done through [facility] had right eighth and ninth rib fractures.  Patient about a week ago started having trouble with breathing and feeling short of breath with exertion.  Associated with some nausea no fevers some intermittent diarrhea no blood in the bowel movements.  And myalgias.  Shortness of breath is predominantly worse with exertion.  Upon presentation room air sats were 97% heart rate was around 104 low 100s.  Patient denied any upper respiratory symptoms.  But she stated she felt as if she had pneumonia.  No new injuries or falls.     Past Medical History:  Diagnosis Date  . Allergy   . Anxiety   . Asthma   . Colon polyps   . Depression   . Diverticulosis   . Environmental allergies    Weekly allergy shots  . Esophageal stricture   . GERD (gastroesophageal reflux disease)   . Hiatal hernia   . Hyperlipidemia   . Hypothyroidism   . IBS (irritable bowel syndrome)   . Osteopenia   . Osteoporosis     Patient Active Problem List   Diagnosis Date Noted  . Vitamin D deficiency 01/24/2015  . Osteopenia 07/24/2014  . Depression 06/11/2014  . Vaginal lesion 04/12/2013  . Dysphagia 05/26/2012  . Esophageal reflux 05/26/2012  . Diaphragmatic hernia 05/26/2012  . 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 Precordial pain 05/12/2012  . Hypothyroidism 08/14/2007  . HYPERLIPIDEMIA 08/14/2007  . ALLERGIC RHINITIS 08/14/2007  . ASTHMA 08/14/2007    Past Surgical History:  Procedure Laterality Date  . ABDOMINAL HYSTERECTOMY    . BARTHOLIN GLAND CYST EXCISION    . BUNIONECTOMY Right   . CHOLECYSTECTOMY    .  ESOPHAGOGASTRODUODENOSCOPY N/A 10/21/2012   Procedure: ESOPHAGOGASTRODUODENOSCOPY (EGD);  Surgeon: Beryle Beams, MD;  Location: [facility];  Service: Endoscopy;  Laterality: N/A;  . EXPLORATORY LAPAROTOMY     endometriosis  . facial cyst     excision  . NASAL SEPTUM SURGERY    . thumb surgery Left    for cyst     OB History    Gravida  2   Para  2   Term  2   Preterm      AB      Living  1     SAB      TAB      Ectopic      Multiple      Live Births           Obstetric Comments  One miscarriage         Home Medications    Prior to Admission medications   Medication Sig Start Date End Date Taking? Authorizing Provider  albuterol (PROVENTIL HFA;VENTOLIN HFA) 108 (90 Base) MCG/ACT inhaler Inhale into the lungs.    [provider]  cholecalciferol (VITAMIN D) 1000 units tablet Take 2,000 Units by mouth daily.    [provider]  Polyethyl Glycol-Propyl Glycol (SYSTANE OP) Place 1 drop into both eyes daily.    [provider]  ranitidine (ZANTAC) 300 MG tablet TAKE 1 TABLET (300 MG  TOTAL) BY MOUTH 2 (TWO) TIMES DAILY. Patient taking differently: Take 300 mg by mouth 2 (two) times daily. TAKE 1 TABLET (300 MG TOTAL) BY MOUTH 2 (TWO) TIMES DAILY. 08/05/15   Irene Shipper, MD  SYNTHROID 88 MCG tablet Take 1 tablet (88 mcg total) by mouth daily with breakfast. 12/15/16   Philemon Kingdom, MD  Triamcinolone Acetonide (NASACORT ALLERGY 24HR NA) Place 1 spray into the nose daily.    [provider]    Family History Family History  Problem Relation Age of Onset  . Lung cancer Mother   . Hypertension Mother   . Thyroid disease Mother   . Heart disease Mother   . Skin cancer Father   . Dementia Father   . Arrhythmia Father   . Heart disease Father        Several relatives on dad's side have had MIs/CABG  . Alzheimer's disease Father   . Heart disease Sister   . Thyroid disease Daughter   . Heart disease Maternal  Grandfather   . Heart disease Paternal Grandmother   . CAD Sister        MI/stent age 38, CABG age 70  . Thyroid disease Sister     Social History Social History   Tobacco Use  . Smoking status: Never Smoker  . Smokeless tobacco: Never Used  Substance Use Topics  . Alcohol use: No    Alcohol/week: 0.0 oz  . Drug use: No     Allergies   Augmentin [amoxicillin-pot clavulanate]; Crestor [rosuvastatin]; Demerol [meperidine]; Latex; Meperidine hcl; Morphine and related; Pentazocine lactate; Simvastatin; Talwin [pentazocine]; and Codeine   Review of Systems Review of Systems  Constitutional: Positive for fatigue.  HENT: Negative for congestion and sore throat.   Eyes: Negative for redness.  Respiratory: Positive for shortness of breath. Negative for cough and wheezing.   Cardiovascular: Positive for chest pain. Negative for leg swelling.  Gastrointestinal: Positive for diarrhea and nausea. Negative for abdominal pain and vomiting.  Genitourinary: Negative for dysuria.  Musculoskeletal: Positive for myalgias.  Skin: Negative for rash.  Neurological: Negative for syncope and headaches.  Hematological: Does not bruise/bleed easily.  Psychiatric/Behavioral: Negative for confusion.     Physical Exam Updated Vital Signs BP 110/70   Pulse 85   Temp 98.2 F (36.8 C)   Resp 15   Ht 1.575 m (5\' 2" )   Wt 60.8 kg (134 lb)   SpO2 100%   BMI 24.51 kg/m   Physical Exam  Constitutional: She is oriented to person, place, and time. She appears well-developed and well-nourished. No distress.  HENT:  Head: Normocephalic and atraumatic.  Mouth/Throat: Oropharynx is clear and moist.  Eyes: Pupils are equal, round, and reactive to light. Conjunctivae and EOM are normal.  Neck: Neck supple.  Cardiovascular: Regular rhythm.  Patient with some low-grade tachycardia heart rate in the low 100s regular.  Pulmonary/Chest: Effort normal and breath sounds normal. No respiratory distress. She  has no wheezes. She has no rales.  Abdominal: Soft. Bowel sounds are normal. There is no tenderness.  Musculoskeletal: Normal range of motion. She exhibits no edema.  Neurological: She is alert and oriented to person, place, and time. No cranial nerve deficit or sensory deficit. She exhibits normal muscle tone. Coordination normal.  Skin: Skin is warm. Capillary refill takes less than 2 seconds.  Nursing note and vitals reviewed.    ED Treatments / Results  Labs (all labs ordered are listed, but only abnormal results are displayed) Labs  Reviewed  COMPREHENSIVE METABOLIC PANEL - Abnormal; Notable for the following components:      Result Value   Glucose, Bld 122 (*)    ALT 13 (*)    GFR calc non Af Amer 60 (*)    All other components within normal limits  CBC WITH DIFFERENTIAL/PLATELET  TROPONIN I  INFLUENZA PANEL BY PCR (TYPE A & B)  LIPASE, BLOOD    EKG EKG Interpretation  Date/Time:  Monday Jun 07 2017 15:03:49 EDT Ventricular Rate:  111 PR Interval:  142 QRS Duration: 64 QT Interval:  318 QTC Calculation: 432 R Axis:   77 Text Interpretation:  Sinus tachycardia Right atrial enlargement Borderline ECG No significant change since last tracing Confirmed by Fredia Sorrow [phone]) on 06/07/2017 3:32:19 PM   Radiology Dg Chest 2 View  Result Date: 06/07/2017 CLINICAL DATA:  Dyspnea x1 week EXAM: CHEST - 2 VIEW COMPARISON:  04/09/2014 FINDINGS: The heart size and mediastinal contours are within normal limits. Both lungs are clear. The visualized skeletal structures are unremarkable. IMPRESSION: No active cardiopulmonary disease. Electronically Signed   By: Ashley Royalty M.D.   On: 06/07/2017 15:46   Ct Angio Chest Pe W/cm &/or Wo Cm  Result Date: 06/07/2017 CLINICAL DATA:  Recent RIGHT rib fractures 4 weeks ago, shortness of breath, slight cough and lethargy since, history asthma, GERD EXAM: CT ANGIOGRAPHY CHEST WITH CONTRAST TECHNIQUE: Multidetector CT imaging of the chest  was performed using the standard protocol during bolus administration of intravenous contrast. Multiplanar CT image reconstructions and MIPs were obtained to evaluate the vascular anatomy. CONTRAST:  190mL ISOVUE-370 IOPAMIDOL (ISOVUE-370) INJECTION 76% IV COMPARISON:  09/18/2012 FINDINGS: Cardiovascular: Atherosclerotic calcification aorta. Aorta normal caliber without aneurysm or dissection. Heart unremarkable. No pericardial effusion. Pulmonary arteries well opacified and patent. No evidence of pulmonary embolism. Mediastinum/Nodes: Base of cervical region unremarkable. Esophagus normal appearance. No thoracic adenopathy. Lungs/Pleura: Minimal dependent density in the posterior lungs. Lungs otherwise clear. No pulmonary infiltrate, pleural effusion or pneumothorax. Upper Abdomen: Gallbladder surgically absent. Visualized upper abdomen otherwise unremarkable Musculoskeletal: No acute osseous findings. Review of the MIP images confirms the above findings. IMPRESSION: No evidence of pulmonary embolism. No significant intrathoracic abnormalities. Aortic Atherosclerosis (ICD10-I70.0). Electronically Signed   By: Lavonia Dana M.D.   On: 06/07/2017 16:36    Procedures Procedures (including critical care time)  Medications Ordered in ED Medications  0.9 %  sodium chloride infusion ( Intravenous Stopped 06/07/17 1738)  ondansetron (ZOFRAN) injection 4 mg (4 mg Intravenous Given 06/07/17 1610)  iopamidol (ISOVUE-370) 76 % injection 100 mL (100 mLs Intravenous Contrast Given 06/07/17 1615)     Initial Impression / Assessment and Plan / ED Course  I have reviewed the triage vital signs and the nursing notes.  Pertinent labs & imaging results that were available during my care of the patient were reviewed by me and considered in my medical decision making (see chart for details).    Patient with extensive work-up for the complaints.  Chest x-ray negative for pneumonia CT angios done negative for any acute  findings negative for pulmonary embolus.  Patient without a leukocytosis.  Labs normal.  No significant electrolyte abnormalities.  Heart rate did improve to down below the 100s.  Patient ambulated with pulse ox never desaturated.  Flu test negative.  Not able to find any acute findings to explain her symptoms.  Patient overall improved and was comfortable with being discharged home and follow-up with her doctors.  Patient's troponin was negative.  Final Clinical Impressions(s) / ED Diagnoses   Final diagnoses:  SOB (shortness of breath)    ED Discharge Orders    None       Fredia Sorrow, MD 06/08/17 [phone]

## 2017-06-07 NOTE — Discharge Instructions (Addendum)
Follow-up with your primary care doctor at [facility].  Today's extensive work-up without any acute findings or explanation for the exertional shortness of breath.  Return for any new or worse symptoms.  Would consider pulmonary function testing.

## 2017-06-15 ENCOUNTER — Ambulatory Visit (INDEPENDENT_AMBULATORY_CARE_PROVIDER_SITE_OTHER): Payer: Medicare Other | Admitting: Internal Medicine

## 2017-06-15 ENCOUNTER — Encounter: Payer: Self-pay | Admitting: Internal Medicine

## 2017-06-15 VITALS — BP 110/72 | HR 85 | Ht 62.0 in | Wt 134.0 lb

## 2017-06-15 DIAGNOSIS — E039 Hypothyroidism, unspecified: Secondary | ICD-10-CM

## 2017-06-15 DIAGNOSIS — E559 Vitamin D deficiency, unspecified: Secondary | ICD-10-CM | POA: Diagnosis not present

## 2017-06-15 DIAGNOSIS — M8580 Other specified disorders of bone density and structure, unspecified site: Secondary | ICD-10-CM | POA: Diagnosis not present

## 2017-06-15 DIAGNOSIS — J301 Allergic rhinitis due to pollen: Secondary | ICD-10-CM | POA: Diagnosis not present

## 2017-06-15 DIAGNOSIS — J3089 Other allergic rhinitis: Secondary | ICD-10-CM | POA: Diagnosis not present

## 2017-06-15 LAB — VITAMIN D 25 HYDROXY (VIT D DEFICIENCY, FRACTURES): VITD: 35.36 ng/mL (ref 30.00–100.00)

## 2017-06-15 LAB — T4, FREE: Free T4: 1.13 ng/dL (ref 0.60–1.60)

## 2017-06-15 LAB — TSH: TSH: 0.7 u[IU]/mL (ref 0.35–4.50)

## 2017-06-15 NOTE — Patient Instructions (Addendum)
Please stop at the lab.  Please continue Synthroid 88 mcg daily.  Take the thyroid hormone every day, with water, at least 30 minutes before breakfast, separated by at least 4 hours from: - acid reflux medications - calcium - iron - multivitamins  Please come back for a follow-up appointment in 1 year. 

## 2017-06-15 NOTE — Progress Notes (Addendum)
Patient ID: Mary Rubio, female   DOB: May 16, 1945, 72 y.o.   MRN: [id number]   HPI  Mary Rubio is a 72 y.o.-year-old female, retruning for f/u for hypothyroidism, dx 1996, vitamin D deficiency, and osteopenia. Last visit 6 months ago. New PCP: Dr. Drema Dallas.  Since last visit, in 04/2017, she broke 2 ribs while bending over. She is still in pain.  She has SOB and was recently found to have emphysema.  She was also in the ED with HTN + tachycardia, tests were negative >> assumed 2/2 viral inf. Now resolved.  At the beginning of last year, she described right lower neck pain at palpation, radiating to supraclavicular fossa.  She had a thyroid ultrasound in 06/2016 which did not show any significant pathology.  Pt is on Synthroid 88 mcg daily, taken: - in am - fasting - at least 1 hour from b'fast - no Ca, Fe, MVI, PPIs - + Zantac later in the day - not on Biotin  Reviewed patient's TFTs: Lab Results  Component Value Date   TSH 3.79 12/14/2016   TSH 1.03 06/09/2016   TSH 0.48 03/05/2016   TSH 0.10 (L) 01/15/2016   TSH 0.90 04/11/2015   TSH 5.04 (H) 02/28/2015   TSH 6.675 (H) 11/28/2014   TSH 2.36 06/29/2014   TSH 1.84 02/27/2014   TSH 0.948 01/22/2014   FREET4 0.92 12/14/2016   FREET4 1.08 06/09/2016   FREET4 1.00 03/05/2016   FREET4 1.43 01/15/2016   FREET4 1.18 04/11/2015   FREET4 0.94 02/28/2015   FREET4 1.09 11/28/2014   FREET4 0.85 06/29/2014   FREET4 0.99 02/27/2014   FREET4 1.86 (H) 01/22/2014  12/06/2015: TSH 0.103 08/17/2013: TSH 3.45, fT4 0.70 >> she was advised to increase the LT4 to 125 mcg, but she did not do it. She feels better on 112 mcg.  07/04/2013: TSH 6.81 >> dose of LT4 increased from 100 to 112 mcg  She had a h/o radiation tx to head or neck (for ear infection >> this was an experimental treatment with several sessions).  Vitamin D insufficiency: Lab Results  Component Value Date   VD25OH 34.94 12/14/2016   VD25OH 27.42 (L)  06/09/2016   VD25OH 31.73 03/05/2016   VD25OH 29.41 (L) 01/15/2016   VD25OH 50.51 02/28/2015   VD25OH 23.35 (L) 07/24/2014   We started vitamin D supplementation (4000 units daily) in 2016.  At last visit, she was telling me that she was forgetting it approximately 5 times a week! Now 2000 IU ~ once a week.  Osteopenia:  Reviewed her DXA scans reports DXA scan -[facility] (06/18/2015): L1-L4: -1.9 RFN: -2.0, LFN: -2.1  FRAX: MOF 16%, Hip: 2.7%  DXA scan (06/06/2013): L1-L4: -1.2 RFN: -1.9, LFN: -1.7 FRAX: MOF 16.4%, Hip: 2.3%  After the DEXA scan from 2017, we discussed to normalize her vitamin D level, start weightbearing exercises and repeat the scan in 2019.  If worse, we need to start antiresorptive medicines  She has severe esophageal reflux/Es rings and strictures. Tried Fosamax >> could not tolerate it - 25 years ago.  She has a h/o injectable  And oral steroid use for allergy and URI. Currently on allergy shots 2x a week.  Her father died in 2016-09-03 >> he was in hospice. Sister has Multiple Myeloma.  ROS: Constitutional: no weight gain/no weight loss, + fatigue, no subjective hyperthermia, no subjective hypothermia Eyes: no blurry vision, no xerophthalmia ENT: no sore throat, no nodules palpated in throat, no dysphagia, no odynophagia, no hoarseness Cardiovascular:  no CP/+ SOB/no palpitations/no leg swelling Respiratory: + cough/+ SOB/no wheezing Gastrointestinal: + N/no V/no D/no C/no acid reflux Musculoskeletal: no muscle aches/+ joint aches, + bone pain Skin: + rash, no hair loss Neurological: no tremors/no numbness/no tingling/no dizziness  I reviewed pt's medications, allergies, PMH, social hx, family hx, and changes were documented in the history of present illness. Otherwise, unchanged from my initial visit note.  Past Medical History:  Diagnosis Date  . Allergy   . Anxiety   . Asthma   . Colon polyps   . Depression   . Diverticulosis   . Environmental  allergies    Weekly allergy shots  . Esophageal stricture   . GERD (gastroesophageal reflux disease)   . Hiatal hernia   . Hyperlipidemia   . Hypothyroidism   . IBS (irritable bowel syndrome)   . Osteopenia   . Osteoporosis    Past Surgical History:  Procedure Laterality Date  . ABDOMINAL HYSTERECTOMY    . BARTHOLIN GLAND CYST EXCISION    . BUNIONECTOMY Right   . CHOLECYSTECTOMY    . ESOPHAGOGASTRODUODENOSCOPY N/A 10/21/2012   Procedure: ESOPHAGOGASTRODUODENOSCOPY (EGD);  Surgeon: Beryle Beams, MD;  Location: [facility];  Service: Endoscopy;  Laterality: N/A;  . EXPLORATORY LAPAROTOMY     endometriosis  . facial cyst     excision  . NASAL SEPTUM SURGERY    . thumb surgery Left    for cyst   History   Social History  . Marital Status: Single    Spouse Name: N/A    Number of Children: 1  . Years of Education: college   Occupational History  . caregiver    Social History Main Topics  . Smoking status: Never Smoker   . Smokeless tobacco: No  . Alcohol Use: No  . Drug Use: No   Current Outpatient Medications on File Prior to Visit  Medication Sig Dispense Refill  . albuterol (PROVENTIL HFA;VENTOLIN HFA) 108 (90 Base) MCG/ACT inhaler Inhale into the lungs.    . cholecalciferol (VITAMIN D) 1000 units tablet Take 2,000 Units by mouth daily.    Vladimir Faster Glycol-Propyl Glycol (SYSTANE OP) Place 1 drop into both eyes daily.    . ranitidine (ZANTAC) 300 MG tablet TAKE 1 TABLET (300 MG TOTAL) BY MOUTH 2 (TWO) TIMES DAILY. (Patient taking differently: Take 300 mg by mouth 2 (two) times daily. TAKE 1 TABLET (300 MG TOTAL) BY MOUTH 2 (TWO) TIMES DAILY.) 60 tablet 5  . SYNTHROID 88 MCG tablet Take 1 tablet (88 mcg total) by mouth daily with breakfast. 90 tablet 3  . Triamcinolone Acetonide (NASACORT ALLERGY 24HR NA) Place 1 spray into the nose daily.     No current facility-administered medications on file prior to visit.    Allergies  Allergen Reactions  . Augmentin  [Amoxicillin-Pot Clavulanate] Other (See Comments)    Stomach pain   . Crestor [Rosuvastatin]     Myalgias  . Demerol [Meperidine]   . Latex Other (See Comments)    Canker sores in mouth (discovered by dentist)  . Meperidine Hcl Other (See Comments)    Does not remember   . Morphine And Related Itching    Has tolerated small doses of hydrocodone  . Pentazocine Lactate Nausea And Vomiting    Sick on stomach  . Simvastatin Other (See Comments)    Muscle aches   . Talwin [Pentazocine]   . Codeine Rash    Has tolerated small doses of hydrocodone   Family History  Problem  Relation Age of Onset  . Lung cancer Mother   . Hypertension Mother   . Thyroid disease Mother   . Heart disease Mother   . Skin cancer Father   . Dementia Father   . Arrhythmia Father   . Heart disease Father        Several relatives on dad's side have had MIs/CABG  . Alzheimer's disease Father   . Heart disease Sister   . Thyroid disease Daughter   . Heart disease Maternal Grandfather   . Heart disease Paternal Grandmother   . CAD Sister        MI/stent age 44, CABG age 45  . Thyroid disease Sister    PE: BP 110/72 (BP Location: Left Arm, Patient Position: Sitting, Cuff Size: Normal)   Pulse 85   Ht '5\' 2"'$  (1.575 m)   Wt 134 lb (60.8 kg)   SpO2 97%   BMI 24.51 kg/m  Body mass index is 24.51 kg/m. Wt Readings from Last 3 Encounters:  06/15/17 134 lb (60.8 kg)  06/07/17 134 lb (60.8 kg)  12/14/16 129 lb (58.5 kg)   Constitutional: Normal weight, in NAD Eyes: PERRLA, EOMI, no exophthalmos ENT: moist mucous membranes, no thyromegaly, no cervical lymphadenopathy Cardiovascular: RRR, No MRG Respiratory: CTA B Gastrointestinal: abdomen soft, NT, ND, BS+ Musculoskeletal: no deformities, strength intact in all 4 Skin: moist, warm, no rashes Neurological: no tremor with outstretched hands, DTR normal in all 4  ASSESSMENT: 1. Hypothyroidism - Thyroid U/S (03/17/2013) - a nonenlarged thyroid, with  very small hypoechoic regions:  Right lobe: 4.2 x 1.5 x 1.2 cm. There are multiple hypoechogenic to anechoic nodules, measuring under 2 mm in diameter  Left lobe: 3.9 x 1.3 x 1.5 cm. There are hypoechoic nodules in the mid pole and lower pole of the left thyroid lobe, with the largest nodule measuring 4 mm in diameter. There is a minimal 1 x 3 mm diameter shadowing calcification in the midpole >> no goiter, small hypoechoic areas, all <4 mm >> no follow up is needed  - Thyroid U/S (07/10/2016) - Nodule # 1: Location: Left; Mid Maximum size: 0.4 cm; Other 2 dimensions: 0.2 cm x 0.4 cm Composition: cannot determine (2) Echogenicity: hyperechoic (1) Echogenic foci: macrocalcifications (1)  ACR TI-RADS recommendations: Nodule does not meet criteria for surveillance or biopsy _______________________________________________ No adenopathy No thyroid nodule meets criteria for biopsy or surveillance, as designated by the newly established ACR TI-RADS criteria.  2. Osteopenia  3.  Vitamin D deficiency  4. Environmental allergies  PLAN:  1. Hypothyroidism - latest thyroid labs reviewed with pt >> normal 04/2017 - she continues on Synthroid d.a.w. 88 Mcg daily - pt feels good on this dose. - we discussed about taking the thyroid hormone every day, with water, >30 minutes before breakfast, separated by >4 hours from acid reflux medications, calcium, iron, multivitamins. Pt. is taking it correctly. - recheck TFTs today  2. Osteopenia -She had a rib fracture in 2016, and another fracture (2 ribs) since last visit (with bending over!) - discussed that the osteopenia may be related to her previous extensive steroid use -Comparing her DXA scans from 2015 and 2017, osteopenia was worse on the latest one -She is due for another DXA scan this year at [facility]  3.  Vitamin D deficiency -Continues on vitamin D 2000 units daily, but not completely compliant with this - takes this ~ weekly -Vitamin D  at last visit was at goal, will repeat today  4.  Environmental allergies - rec'd to stop dairy - drinks a lot of milk  Component     Latest Ref Rng & Units 06/15/2017  TSH     0.35 - 4.50 uIU/mL 0.70  T4,Free(Direct)     0.60 - 1.60 ng/dL 1.13  VITD     30.00 - 100.00 ng/mL 35.36   Labs are normal.  Addendum: DXA scan -[facility] (06/21/2017): L1-L4: -2.0 (-2%) RFN: -1.9, LFN: -2.0             FRAX: MOF 18%, Hip: 3.2%  DXA scan -[facility] (06/18/2015): L1-L4: -1.9 RFN: -2.0, LFN: -2.1             FRAX: MOF 16%, Hip: 2.7%  Scores appear ~stable from 2017, but FRAX worse at the hip. Especially in the context of her new fx's, I would suggest tx with Prolia.  Philemon Kingdom, MD PhD [facility]

## 2017-06-21 ENCOUNTER — Encounter: Payer: Self-pay | Admitting: Internal Medicine

## 2017-06-21 DIAGNOSIS — M8589 Other specified disorders of bone density and structure, multiple sites: Secondary | ICD-10-CM | POA: Diagnosis not present

## 2017-06-23 DIAGNOSIS — J3089 Other allergic rhinitis: Secondary | ICD-10-CM | POA: Diagnosis not present

## 2017-06-23 DIAGNOSIS — J301 Allergic rhinitis due to pollen: Secondary | ICD-10-CM | POA: Diagnosis not present

## 2017-06-25 DIAGNOSIS — H4322 Crystalline deposits in vitreous body, left eye: Secondary | ICD-10-CM | POA: Diagnosis not present

## 2017-06-25 DIAGNOSIS — H43392 Other vitreous opacities, left eye: Secondary | ICD-10-CM | POA: Diagnosis not present

## 2017-06-28 ENCOUNTER — Encounter: Payer: Self-pay | Admitting: Internal Medicine

## 2017-06-29 DIAGNOSIS — J3089 Other allergic rhinitis: Secondary | ICD-10-CM | POA: Diagnosis not present

## 2017-06-29 DIAGNOSIS — J301 Allergic rhinitis due to pollen: Secondary | ICD-10-CM | POA: Diagnosis not present

## 2017-07-01 DIAGNOSIS — Z23 Encounter for immunization: Secondary | ICD-10-CM | POA: Diagnosis not present

## 2017-07-01 DIAGNOSIS — M8580 Other specified disorders of bone density and structure, unspecified site: Secondary | ICD-10-CM | POA: Diagnosis not present

## 2017-07-01 DIAGNOSIS — E039 Hypothyroidism, unspecified: Secondary | ICD-10-CM | POA: Diagnosis not present

## 2017-07-01 DIAGNOSIS — R0789 Other chest pain: Secondary | ICD-10-CM | POA: Diagnosis not present

## 2017-07-09 DIAGNOSIS — R0602 Shortness of breath: Secondary | ICD-10-CM | POA: Diagnosis not present

## 2017-07-09 DIAGNOSIS — J301 Allergic rhinitis due to pollen: Secondary | ICD-10-CM | POA: Diagnosis not present

## 2017-07-09 DIAGNOSIS — H1045 Other chronic allergic conjunctivitis: Secondary | ICD-10-CM | POA: Diagnosis not present

## 2017-07-09 DIAGNOSIS — J3089 Other allergic rhinitis: Secondary | ICD-10-CM | POA: Diagnosis not present

## 2017-07-14 DIAGNOSIS — J301 Allergic rhinitis due to pollen: Secondary | ICD-10-CM | POA: Diagnosis not present

## 2017-07-14 DIAGNOSIS — J3089 Other allergic rhinitis: Secondary | ICD-10-CM | POA: Diagnosis not present

## 2017-07-16 ENCOUNTER — Telehealth: Payer: Self-pay

## 2017-07-16 NOTE — Telephone Encounter (Signed)
Pt stated that she is nervous about Prolia and she stated she will think about it and call you back.

## 2017-07-16 NOTE — Telephone Encounter (Signed)
OK, noted

## 2017-07-16 NOTE — Telephone Encounter (Signed)
-----   Message from Philemon Kingdom, MD sent at 07/12/2017  7:00 AM EDT ----- C,  She did not read the msg I sent her Re: her DXA scan. Can you please let her know and see if she wants to proceed with Prolia? Ty, C

## 2017-07-21 DIAGNOSIS — J3089 Other allergic rhinitis: Secondary | ICD-10-CM | POA: Diagnosis not present

## 2017-07-21 DIAGNOSIS — J301 Allergic rhinitis due to pollen: Secondary | ICD-10-CM | POA: Diagnosis not present

## 2017-07-23 DIAGNOSIS — H40023 Open angle with borderline findings, high risk, bilateral: Secondary | ICD-10-CM | POA: Diagnosis not present

## 2017-07-27 DIAGNOSIS — J301 Allergic rhinitis due to pollen: Secondary | ICD-10-CM | POA: Diagnosis not present

## 2017-07-27 DIAGNOSIS — J3089 Other allergic rhinitis: Secondary | ICD-10-CM | POA: Diagnosis not present

## 2017-07-28 DIAGNOSIS — S2241XG Multiple fractures of ribs, right side, subsequent encounter for fracture with delayed healing: Secondary | ICD-10-CM | POA: Diagnosis not present

## 2017-07-28 DIAGNOSIS — M8580 Other specified disorders of bone density and structure, unspecified site: Secondary | ICD-10-CM | POA: Diagnosis not present

## 2017-07-29 DIAGNOSIS — M8448XD Pathological fracture, other site, subsequent encounter for fracture with routine healing: Secondary | ICD-10-CM | POA: Diagnosis not present

## 2017-08-02 DIAGNOSIS — J3089 Other allergic rhinitis: Secondary | ICD-10-CM | POA: Diagnosis not present

## 2017-08-02 DIAGNOSIS — J301 Allergic rhinitis due to pollen: Secondary | ICD-10-CM | POA: Diagnosis not present

## 2017-08-11 DIAGNOSIS — R42 Dizziness and giddiness: Secondary | ICD-10-CM | POA: Diagnosis not present

## 2017-08-11 DIAGNOSIS — J3089 Other allergic rhinitis: Secondary | ICD-10-CM | POA: Diagnosis not present

## 2017-08-11 DIAGNOSIS — R030 Elevated blood-pressure reading, without diagnosis of hypertension: Secondary | ICD-10-CM | POA: Diagnosis not present

## 2017-08-11 DIAGNOSIS — J301 Allergic rhinitis due to pollen: Secondary | ICD-10-CM | POA: Diagnosis not present

## 2017-08-23 DIAGNOSIS — J3089 Other allergic rhinitis: Secondary | ICD-10-CM | POA: Diagnosis not present

## 2017-08-23 DIAGNOSIS — J301 Allergic rhinitis due to pollen: Secondary | ICD-10-CM | POA: Diagnosis not present

## 2017-08-31 DIAGNOSIS — J3089 Other allergic rhinitis: Secondary | ICD-10-CM | POA: Diagnosis not present

## 2017-08-31 DIAGNOSIS — J301 Allergic rhinitis due to pollen: Secondary | ICD-10-CM | POA: Diagnosis not present

## 2017-09-08 DIAGNOSIS — J301 Allergic rhinitis due to pollen: Secondary | ICD-10-CM | POA: Diagnosis not present

## 2017-09-08 DIAGNOSIS — J3089 Other allergic rhinitis: Secondary | ICD-10-CM | POA: Diagnosis not present

## 2017-09-16 DIAGNOSIS — J301 Allergic rhinitis due to pollen: Secondary | ICD-10-CM | POA: Diagnosis not present

## 2017-09-16 DIAGNOSIS — J3089 Other allergic rhinitis: Secondary | ICD-10-CM | POA: Diagnosis not present

## 2017-09-22 DIAGNOSIS — J3089 Other allergic rhinitis: Secondary | ICD-10-CM | POA: Diagnosis not present

## 2017-09-22 DIAGNOSIS — J301 Allergic rhinitis due to pollen: Secondary | ICD-10-CM | POA: Diagnosis not present

## 2017-09-29 DIAGNOSIS — J3089 Other allergic rhinitis: Secondary | ICD-10-CM | POA: Diagnosis not present

## 2017-09-29 DIAGNOSIS — J301 Allergic rhinitis due to pollen: Secondary | ICD-10-CM | POA: Diagnosis not present

## 2017-10-07 DIAGNOSIS — M8580 Other specified disorders of bone density and structure, unspecified site: Secondary | ICD-10-CM | POA: Diagnosis not present

## 2017-10-07 DIAGNOSIS — M130 Polyarthritis, unspecified: Secondary | ICD-10-CM | POA: Diagnosis not present

## 2017-10-07 DIAGNOSIS — S2241XG Multiple fractures of ribs, right side, subsequent encounter for fracture with delayed healing: Secondary | ICD-10-CM | POA: Diagnosis not present

## 2017-10-07 DIAGNOSIS — E039 Hypothyroidism, unspecified: Secondary | ICD-10-CM | POA: Diagnosis not present

## 2017-10-08 DIAGNOSIS — J301 Allergic rhinitis due to pollen: Secondary | ICD-10-CM | POA: Diagnosis not present

## 2017-10-13 DIAGNOSIS — J3089 Other allergic rhinitis: Secondary | ICD-10-CM | POA: Diagnosis not present

## 2017-10-13 DIAGNOSIS — J301 Allergic rhinitis due to pollen: Secondary | ICD-10-CM | POA: Diagnosis not present

## 2017-10-20 DIAGNOSIS — J301 Allergic rhinitis due to pollen: Secondary | ICD-10-CM | POA: Diagnosis not present

## 2017-10-20 DIAGNOSIS — J3089 Other allergic rhinitis: Secondary | ICD-10-CM | POA: Diagnosis not present

## 2017-10-25 DIAGNOSIS — J3089 Other allergic rhinitis: Secondary | ICD-10-CM | POA: Diagnosis not present

## 2017-10-25 DIAGNOSIS — J301 Allergic rhinitis due to pollen: Secondary | ICD-10-CM | POA: Diagnosis not present

## 2017-11-02 ENCOUNTER — Ambulatory Visit (HOSPITAL_COMMUNITY)
Admission: EM | Admit: 2017-11-02 | Discharge: 2017-11-02 | Disposition: A | Discharge status: Home / Self Care | Payer: Medicare Other | Attending: Emergency Medicine | Admitting: Emergency Medicine

## 2017-11-02 ENCOUNTER — Encounter (HOSPITAL_COMMUNITY)
Admission: EM | Disposition: A | Discharge status: Home / Self Care | Payer: Self-pay | Source: Home / Self Care | Attending: Emergency Medicine

## 2017-11-02 ENCOUNTER — Encounter (HOSPITAL_COMMUNITY): Payer: Self-pay

## 2017-11-02 ENCOUNTER — Emergency Department (HOSPITAL_COMMUNITY): Payer: Medicare Other

## 2017-11-02 ENCOUNTER — Other Ambulatory Visit: Payer: Self-pay

## 2017-11-02 DIAGNOSIS — K210 Gastro-esophageal reflux disease with esophagitis: Secondary | ICD-10-CM | POA: Insufficient documentation

## 2017-11-02 DIAGNOSIS — F419 Anxiety disorder, unspecified: Secondary | ICD-10-CM | POA: Diagnosis not present

## 2017-11-02 DIAGNOSIS — K219 Gastro-esophageal reflux disease without esophagitis: Secondary | ICD-10-CM | POA: Diagnosis not present

## 2017-11-02 DIAGNOSIS — T18128A Food in esophagus causing other injury, initial encounter: Secondary | ICD-10-CM | POA: Diagnosis not present

## 2017-11-02 DIAGNOSIS — R000 Tachycardia, unspecified: Secondary | ICD-10-CM | POA: Diagnosis not present

## 2017-11-02 DIAGNOSIS — E039 Hypothyroidism, unspecified: Secondary | ICD-10-CM | POA: Diagnosis not present

## 2017-11-02 DIAGNOSIS — E559 Vitamin D deficiency, unspecified: Secondary | ICD-10-CM | POA: Insufficient documentation

## 2017-11-02 DIAGNOSIS — K222 Esophageal obstruction: Secondary | ICD-10-CM | POA: Diagnosis not present

## 2017-11-02 DIAGNOSIS — F329 Major depressive disorder, single episode, unspecified: Secondary | ICD-10-CM | POA: Insufficient documentation

## 2017-11-02 DIAGNOSIS — Z79890 Hormone replacement therapy (postmenopausal): Secondary | ICD-10-CM | POA: Insufficient documentation

## 2017-11-02 DIAGNOSIS — K228 Other specified diseases of esophagus: Secondary | ICD-10-CM | POA: Diagnosis not present

## 2017-11-02 DIAGNOSIS — X58XXXA Exposure to other specified factors, initial encounter: Secondary | ICD-10-CM | POA: Insufficient documentation

## 2017-11-02 DIAGNOSIS — J301 Allergic rhinitis due to pollen: Secondary | ICD-10-CM | POA: Diagnosis not present

## 2017-11-02 DIAGNOSIS — J45909 Unspecified asthma, uncomplicated: Secondary | ICD-10-CM | POA: Insufficient documentation

## 2017-11-02 DIAGNOSIS — R52 Pain, unspecified: Secondary | ICD-10-CM | POA: Diagnosis not present

## 2017-11-02 DIAGNOSIS — J3089 Other allergic rhinitis: Secondary | ICD-10-CM | POA: Diagnosis not present

## 2017-11-02 DIAGNOSIS — R0602 Shortness of breath: Secondary | ICD-10-CM | POA: Diagnosis not present

## 2017-11-02 DIAGNOSIS — K449 Diaphragmatic hernia without obstruction or gangrene: Secondary | ICD-10-CM | POA: Diagnosis not present

## 2017-11-02 DIAGNOSIS — K209 Esophagitis, unspecified without bleeding: Secondary | ICD-10-CM | POA: Insufficient documentation

## 2017-11-02 DIAGNOSIS — R079 Chest pain, unspecified: Secondary | ICD-10-CM | POA: Diagnosis not present

## 2017-11-02 DIAGNOSIS — R1310 Dysphagia, unspecified: Secondary | ICD-10-CM | POA: Diagnosis not present

## 2017-11-02 DIAGNOSIS — Z79899 Other long term (current) drug therapy: Secondary | ICD-10-CM | POA: Diagnosis not present

## 2017-11-02 DIAGNOSIS — I10 Essential (primary) hypertension: Secondary | ICD-10-CM | POA: Diagnosis not present

## 2017-11-02 DIAGNOSIS — T18120A Food in esophagus causing compression of trachea, initial encounter: Secondary | ICD-10-CM | POA: Diagnosis not present

## 2017-11-02 DIAGNOSIS — E785 Hyperlipidemia, unspecified: Secondary | ICD-10-CM | POA: Diagnosis not present

## 2017-11-02 DIAGNOSIS — R0789 Other chest pain: Secondary | ICD-10-CM | POA: Diagnosis not present

## 2017-11-02 HISTORY — PX: ESOPHAGOGASTRODUODENOSCOPY: SHX5428

## 2017-11-02 LAB — CBC
HCT: 40.2 % (ref 36.0–46.0)
Hemoglobin: 13.1 g/dL (ref 12.0–15.0)
MCH: 30.2 pg (ref 26.0–34.0)
MCHC: 32.6 g/dL (ref 30.0–36.0)
MCV: 92.6 fL (ref 80.0–100.0)
Platelets: 200 10*3/uL (ref 150–400)
RBC: 4.34 MIL/uL (ref 3.87–5.11)
RDW: 12.3 % (ref 11.5–15.5)
WBC: 6.6 10*3/uL (ref 4.0–10.5)
nRBC: 0 % (ref 0.0–0.2)

## 2017-11-02 LAB — I-STAT TROPONIN, ED: Troponin i, poc: 0 ng/mL (ref 0.00–0.08)

## 2017-11-02 LAB — COMPREHENSIVE METABOLIC PANEL
ALT: 18 U/L (ref 0–44)
AST: 23 U/L (ref 15–41)
Albumin: 4.1 g/dL (ref 3.5–5.0)
Alkaline Phosphatase: 48 U/L (ref 38–126)
Anion gap: 8 (ref 5–15)
BUN: 11 mg/dL (ref 8–23)
CO2: 23 mmol/L (ref 22–32)
Calcium: 9.1 mg/dL (ref 8.9–10.3)
Chloride: 107 mmol/L (ref 98–111)
Creatinine, Ser: 0.92 mg/dL (ref 0.44–1.00)
GFR calc Af Amer: >60 mL/min (ref >60)
GFR calc non Af Amer: >60 mL/min (ref >60)
Glucose, Bld: 113 mg/dL — ABNORMAL HIGH (ref 70–99)
Potassium: 3.4 mmol/L — ABNORMAL LOW (ref 3.5–5.1)
Sodium: 138 mmol/L (ref 135–145)
Total Bilirubin: 0.6 mg/dL (ref 0.3–1.2)
Total Protein: 7 g/dL (ref 6.5–8.1)

## 2017-11-02 SURGERY — EGD (ESOPHAGOGASTRODUODENOSCOPY)
Anesthesia: Moderate Sedation

## 2017-11-02 MED ORDER — SODIUM CHLORIDE 0.9 % IV SOLN
Freq: Once | INTRAVENOUS | Status: AC
  Administered 2017-11-02: 14:00:00 via INTRAVENOUS

## 2017-11-02 MED ORDER — MIDAZOLAM HCL 10 MG/2ML IJ SOLN
INTRAMUSCULAR | Status: DC | PRN
  Administered 2017-11-02: 2 mg via INTRAVENOUS
  Administered 2017-11-02: 1 mg via INTRAVENOUS
  Administered 2017-11-02 (×3): 2 mg via INTRAVENOUS

## 2017-11-02 MED ORDER — FAMOTIDINE 40 MG PO TABS: 40.0000 mg | ORAL_TABLET | Freq: Two times a day (BID) | ORAL | 0 refills | Status: DC

## 2017-11-02 MED ORDER — BUTAMBEN-TETRACAINE-BENZOCAINE 2-2-14 % EX AERO
INHALATION_SPRAY | CUTANEOUS | Status: DC | PRN
  Administered 2017-11-02: 1 via TOPICAL

## 2017-11-02 MED ORDER — MIDAZOLAM HCL 5 MG/ML IJ SOLN
INTRAMUSCULAR | Status: AC
  Filled 2017-11-02: qty 2

## 2017-11-02 MED ORDER — FENTANYL CITRATE (PF) 100 MCG/2ML IJ SOLN
INTRAMUSCULAR | Status: DC | PRN
  Administered 2017-11-02 (×4): 25 ug via INTRAVENOUS

## 2017-11-02 MED ORDER — STERILE WATER FOR INJECTION IJ SOLN
INTRAMUSCULAR | Status: AC
  Administered 2017-11-02: 10 mL
  Filled 2017-11-02: qty 10

## 2017-11-02 MED ORDER — SODIUM CHLORIDE 0.9 % IV SOLN: INTRAVENOUS | Status: DC

## 2017-11-02 MED ORDER — MIDAZOLAM HCL 5 MG/ML IJ SOLN
INTRAMUSCULAR | Status: AC
  Filled 2017-11-02: qty 1

## 2017-11-02 MED ORDER — GLUCAGON HCL RDNA (DIAGNOSTIC) 1 MG IJ SOLR
1.0000 mg | Freq: Once | INTRAMUSCULAR | Status: AC
  Administered 2017-11-02: 1 mg via INTRAVENOUS
  Filled 2017-11-02: qty 1

## 2017-11-02 MED ORDER — DIPHENHYDRAMINE HCL 50 MG/ML IJ SOLN
INTRAMUSCULAR | Status: AC
  Filled 2017-11-02: qty 1

## 2017-11-02 MED ORDER — FENTANYL CITRATE (PF) 100 MCG/2ML IJ SOLN
INTRAMUSCULAR | Status: AC
  Filled 2017-11-02: qty 4

## 2017-11-02 NOTE — ED Notes (Signed)
Pt states she feels as though the food bolus has passed, states 0/10 pain at this time.

## 2017-11-02 NOTE — Discharge Instructions (Addendum)
Dr. Hilarie Fredrickson wishes for you to take your famotidine, also known as Pepcid, as 40 mg twice a day.  Please schedule an appointment with him in his office for follow-up.  Today you received medications that may make you sleepy or impair your ability to make decisions.  For the next 48 hours please do not drive, operate heavy machinery, care for a small child with out another adult present, or perform any activities that may cause harm to you or someone else if you were to fall asleep or be impaired. Please be careful not to fall.

## 2017-11-02 NOTE — ED Provider Notes (Signed)
[facility] EMERGENCY DEPARTMENT Provider Note   CSN: [id number] Arrival date & time: 11/02/17  1254     History   Chief Complaint Chief Complaint  Patient presents with  . Dysphagia    HPI Mary Rubio is a 72 y.o. female with a past medical history of hypertension, esophageal stricture, environmental allergies, GERD, hiatal hernia, who presents today for evaluation of inability to swallow.  She reports that she was eating grits and a biscuit when she felt like it got stuck in her esophagus.  She reports that that happened approximately 1 hour prior to arrival.  She was initially able to expel some of the biscuit however still has intermittent waxing and waning pain in the middle of her chest.  She is frequently vomiting/spitting up.  She has never had anything like this before.  She had an endoscopy over 3 years ago showing multiple esophageal strictures.  She has never had esophageal dilation before.  HPI  Past Medical History:  Diagnosis Date  . Allergy   . Anxiety   . Asthma   . Colon polyps   . Depression   . Diverticulosis   . Environmental allergies    Weekly allergy shots  . Esophageal stricture   . GERD (gastroesophageal reflux disease)   . Hiatal hernia   . Hyperlipidemia   . Hypothyroidism   . IBS (irritable bowel syndrome)   . Osteopenia   . Osteoporosis     Patient Active Problem List   Diagnosis Date Noted  . Esophageal obstruction due to food impaction   . Esophagitis   . Vitamin D deficiency 01/24/2015  . Osteopenia 07/24/2014  . Depression 06/11/2014  . Vaginal lesion 04/12/2013  . Dysphagia 05/26/2012  . Esophageal reflux 05/26/2012  . Diaphragmatic hernia 05/26/2012  . 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 Precordial pain 05/12/2012  . Hypothyroidism 08/14/2007  . HYPERLIPIDEMIA 08/14/2007  . ALLERGIC RHINITIS 08/14/2007  . ASTHMA 08/14/2007    Past Surgical History:  Procedure Laterality Date  . ABDOMINAL  HYSTERECTOMY    . BARTHOLIN GLAND CYST EXCISION    . BUNIONECTOMY Right   . CHOLECYSTECTOMY    . ESOPHAGOGASTRODUODENOSCOPY N/A 10/21/2012   Procedure: ESOPHAGOGASTRODUODENOSCOPY (EGD);  Surgeon: Beryle Beams, MD;  Location: [facility];  Service: Endoscopy;  Laterality: N/A;  . EXPLORATORY LAPAROTOMY     endometriosis  . facial cyst     excision  . NASAL SEPTUM SURGERY    . thumb surgery Left    for cyst     OB History    Gravida  2   Para  2   Term  2   Preterm      AB      Living  1     SAB      TAB      Ectopic      Multiple      Live Births           Obstetric Comments  One miscarriage         Home Medications    Prior to Admission medications   Medication Sig Start Date End Date Taking? Authorizing Provider  albuterol (PROVENTIL HFA;VENTOLIN HFA) 108 (90 Base) MCG/ACT inhaler Inhale 2 puffs into the lungs every 4 (four) hours as needed.    Yes [provider]  azelastine (OPTIVAR) 0.05 % ophthalmic solution Place 1 drop into both eyes daily.    Yes [provider]  cholecalciferol (VITAMIN D) 1000 units tablet Take  2,000 Units by mouth daily.   Yes [provider]  EPINEPHrine (EPIPEN 2-PAK) 0.3 mg/0.3 mL IJ SOAJ injection Inject 0.3 mg into the muscle once.   Yes [provider]  Polyethyl Glycol-Propyl Glycol (SYSTANE OP) Place 1 drop into both eyes as needed.    Yes [provider]  ranitidine (ZANTAC) 300 MG tablet TAKE 1 TABLET (300 MG TOTAL) BY MOUTH 2 (TWO) TIMES DAILY. Patient taking differently: Take 300 mg by mouth 2 (two) times daily. TAKE 1 TABLET (300 MG TOTAL) BY MOUTH 2 (TWO) TIMES DAILY. 08/05/15  Yes Irene Shipper, MD  SYNTHROID 88 MCG tablet Take 1 tablet (88 mcg total) by mouth daily with breakfast. 12/15/16  Yes Philemon Kingdom, MD  Triamcinolone Acetonide (NASACORT ALLERGY 24HR NA) Place 1 spray into the nose daily.   Yes [provider]  famotidine (PEPCID) 40 MG tablet  Take 1 tablet (40 mg total) by mouth 2 (two) times daily. 11/02/17 12/02/17  Lorin Glass, PA-C    Family History Family History  Problem Relation Age of Onset  . Lung cancer Mother   . Hypertension Mother   . Thyroid disease Mother   . Heart disease Mother   . Skin cancer Father   . Dementia Father   . Arrhythmia Father   . Heart disease Father        Several relatives on dad's side have had MIs/CABG  . Alzheimer's disease Father   . Heart disease Sister   . Thyroid disease Daughter   . Heart disease Maternal Grandfather   . Heart disease Paternal Grandmother   . CAD Sister        MI/stent age 31, CABG age 32  . Thyroid disease Sister     Social History Social History   Tobacco Use  . Smoking status: Never Smoker  . Smokeless tobacco: Never Used  Substance Use Topics  . Alcohol use: No    Alcohol/week: 0.0 standard drinks  . Drug use: No     Allergies   Augmentin [amoxicillin-pot clavulanate]; Crestor [rosuvastatin]; Demerol [meperidine]; Latex; Meperidine hcl; Morphine and related; Pentazocine lactate; Simvastatin; Talwin [pentazocine]; and Codeine   Review of Systems Review of Systems  Constitutional: Negative for chills and fever.  HENT: Negative for ear pain and sore throat.   Eyes: Negative for pain and visual disturbance.  Respiratory: Negative for cough and shortness of breath.   Cardiovascular: Positive for chest pain. Negative for palpitations.  Gastrointestinal: Negative for abdominal pain, nausea and vomiting.       Inability to swallow secretions  Genitourinary: Negative for dysuria and hematuria.  Musculoskeletal: Negative for arthralgias and back pain.  Skin: Negative for color change and rash.  Neurological: Negative for seizures, syncope and headaches.  All other systems reviewed and are negative.    Physical Exam Updated Vital Signs BP 108/74   Pulse 75   Temp 98.2 F (36.8 C) (Oral)   Resp 12   Ht 5\' 3"  (1.6 m)   Wt 60.8 kg    SpO2 100%   BMI 23.74 kg/m   Physical Exam  Constitutional: She is oriented to person, place, and time. She appears well-developed and well-nourished.  Uncomfortable, frequently coughing/vomiting up secretions  HENT:  Head: Normocephalic and atraumatic.  Mouth/Throat: Oropharynx is clear and moist.  Eyes: Conjunctivae are normal.  Neck: Normal range of motion. Neck supple.  Cardiovascular: Normal rate, regular rhythm and normal heart sounds.  No murmur heard. Pulmonary/Chest: Effort normal and breath sounds  normal. No stridor. No respiratory distress. She has no wheezes.  Abdominal: Soft. Bowel sounds are normal. She exhibits no distension and no mass. There is no tenderness. There is no guarding.  Musculoskeletal: She exhibits no edema.  Neurological: She is alert and oriented to person, place, and time.  Skin: Skin is warm and dry.  Psychiatric: She has a normal mood and affect.  Nursing note and vitals reviewed.    ED Treatments / Results  Labs (all labs ordered are listed, but only abnormal results are displayed) Labs Reviewed  COMPREHENSIVE METABOLIC PANEL - Abnormal; Notable for the following components:      Result Value   Potassium 3.4 (*)    Glucose, Bld 113 (*)    All other components within normal limits  CBC  I-STAT TROPONIN, ED    EKG EKG Interpretation  Date/Time:  Tuesday November 02 2017 13:04:08 EDT Ventricular Rate:  103 PR Interval:    QRS Duration: 76 QT Interval:  342 QTC Calculation: 448 R Axis:   70 Text Interpretation:  Sinus tachycardia RAE, consider biatrial enlargement Confirmed by Lennice Sites [phone]) on 11/02/2017 1:18:54 PM   Radiology Dg Chest 2 View  Result Date: 11/02/2017 CLINICAL DATA:  Globus sensation after eating this morning. Patient reports emesis as result. History of hiatal hernia. EXAM: CHEST - 2 VIEW COMPARISON:  06/07/2017 CXR and CT FINDINGS: The heart size and mediastinal contours are within normal limits. Small  air-fluid level within a hiatal hernia. Both lungs are clear. No evidence of aspiration. No retained foreign body is identified the visualized skeletal structures are unremarkable. Numerous cardiac monitoring leads project over the thorax. IMPRESSION: No active cardiopulmonary disease.  Small hiatal hernia. Electronically Signed   By: Ashley Royalty M.D.   On: 11/02/2017 14:58    Procedures Procedures (including critical care time)   Medications Ordered in ED Medications  0.9 %  sodium chloride infusion ( Intravenous Stopped 11/02/17 2032)  glucagon (human recombinant) (GLUCAGEN) injection 1 mg (1 mg Intravenous Given 11/02/17 1423)  sterile water (preservative free) injection (10 mLs  Given 11/02/17 1423)     Initial Impression / Assessment and Plan / ED Course  I have reviewed the triage vital signs and the nursing notes.  Pertinent labs & imaging results that were available during my care of the patient were reviewed by me and considered in my medical decision making (see chart for details).  Clinical Course as of Nov 03 2135  Tue Nov 02, 2017  1355 Trees/ grass L, mold, and dust- R   [EH]  1440 Spoke with GI who will see patient.    [EH]  1643 GI PA in room.  Dr Hilarie Fredrickson to evaluate.  She reports that about 10 minutes after Glucagon she was feeling better and feels like it has passed.  Stopped throwing up spit.     [EH]  48 Dr. Hilarie Fredrickson to scope patient    [EH]  1820 Dr. Hilarie Fredrickson has completed endoscopy.  His recommendations for discharge are written in his note.  Will monitor patient until she regains consciousness, and then discharge.   [EH]  2018 Patient reevaluated, she is awake and alert, has ambulated to the bathroom without difficulty.  She is drinking water without difficulty.  She is requesting discharge at this time.   [EH]    Clinical Course User Index [EH] Lorin Glass, PA-C   Patient presents today for evaluation of inability to swallow after eating a biscuit.   She states  she feels like it got it stuck in her throat.  Exam is consistent with a esophageal obstruction from food bolus, patient is frequently coughing/spitting up clear saliva/secretions with inability to swallow.  CXR ordered, Labs drawn, GI consulted.   She is without significant hematologic or electrolyte abnormalities.  Given her age with chest pain troponin was obtained which was normal.  EKG does not show acute changes.  Patient was given glucagon, after approximately 10 minutes she stated that she felt better.  GI was consulted and evaluated patient, where she reportedly failed a swallow test.  Endoscopy was performed at bedside by Dr. Hilarie Fredrickson, please see his notes.  Patient was observed in the emergency room for multiple hours after until she was awake, alert and oriented and could ambulate without difficulty.  She was discharged with Pepcid 40 mg twice daily per Dr. Vena Rua instructions.   Return precautions were discussed with patient who states their understanding.  At the time of discharge patient denied any unaddressed complaints or concerns.  Patient is agreeable for discharge home.   Final Clinical Impressions(s) / ED Diagnoses   Final diagnoses:  Esophageal obstruction due to food impaction    ED Discharge Orders         Ordered    famotidine (PEPCID) 40 MG tablet  2 times daily     11/02/17 2020           Lorin Glass, PA-C 11/02/17 2140    Lennice Sites, DO 11/02/17 2141

## 2017-11-02 NOTE — Consult Note (Signed)
[facility] Consult: 4:54 PM 11/02/2017  LOS: 0 days    Referring Provider: Dr Barrie Dunker in ED  Primary Care Physician:  Leighton Ruff, MD Primary Gastroenterologist:  Dr. Henrene Pastor.       Reason for Consultation:  Acute dysphagia   HPI: Mary Rubio is a 72 y.o. female.  Hx GERD.  HTN.  HLD>  Hypothyroidism.  Colon polyps, diverticulosis.  Asthma.   Surgeries include but not limited to cholecystectomy, exploratory laparotomy for endometriosis, nasal septal surgery, abdominal hysterectomy. Latest colonoscopy and EGD in 05/2014.  Colonoscopy for unexplained chronic abdominal discomfort, minor rectal bleeding.  Exam completed to the terminal ileum.  She had internal hemorrhoids but otherwise grossly normal exam.  Dr. Henrene Pastor suspected irritable bowel syndrome  On EGD she had multiple rings in the distal esophagus.  There was an inflammatory nodule at the region of the GE junction.  Endoscopic appearance was that of reflux with peptic stricture.  This was not dilated to the degree of inflammation..  Biopsies revealed chronic inflammation and prominent muscle. Patient has frequent dysphagia but she can normally work around it by swallowing fluids.  For management of GERD symptoms she takes ranitidine anywhere from 300 to 600 mg a day, ranitidine has proved more useful for her than any other meds including PPIs. This morning she grits and developed acute dysphagia.  When she tried to swallow a biscuit with gravy it would not go down, same thing with water.  She had acute pain associated with the dysphagia.  Ate a biscuit with gravy and grits late this morning.   All the way to the hospital and in the ED she was bringing up thick phlegm.   She received IV glucagon at about 230 and within 15 minutes she  felt a sense of pressure relieving in her esophagus but has not tried to swallow yet. 2 view chest x-ray at 250 this afternoon showed a small hiatal hernia only.  No retained foreign body/food seen. Other than slightly low potassium at 3.4, CMET is normal.  CBC is normal. Troponin obtained and is negative.   Past Medical History:  Diagnosis Date  . Allergy   . Anxiety   . Asthma   . Colon polyps   . Depression   . Diverticulosis   . Environmental allergies    Weekly allergy shots  . Esophageal stricture   . GERD (gastroesophageal reflux disease)   . Hiatal hernia   . Hyperlipidemia   . Hypothyroidism   . IBS (irritable bowel syndrome)   . Osteopenia   . Osteoporosis     Past Surgical History:  Procedure Laterality Date  . ABDOMINAL HYSTERECTOMY    . BARTHOLIN GLAND CYST EXCISION    . BUNIONECTOMY Right   . CHOLECYSTECTOMY    . ESOPHAGOGASTRODUODENOSCOPY N/A 10/21/2012   Procedure: ESOPHAGOGASTRODUODENOSCOPY (EGD);  Surgeon: Beryle Beams, MD;  Location: [facility];  Service: Endoscopy;  Laterality: N/A;  . EXPLORATORY LAPAROTOMY     endometriosis  . facial cyst     excision  . NASAL SEPTUM  SURGERY    . thumb surgery Left    for cyst    Prior to Admission medications   Medication Sig Start Date End Date Taking? Authorizing Provider  albuterol (PROVENTIL HFA;VENTOLIN HFA) 108 (90 Base) MCG/ACT inhaler Inhale into the lungs.    [provider]  cholecalciferol (VITAMIN D) 1000 units tablet Take 2,000 Units by mouth daily.    [provider]  Polyethyl Glycol-Propyl Glycol (SYSTANE OP) Place 1 drop into both eyes daily.    [provider]  ranitidine (ZANTAC) 300 MG tablet TAKE 1 TABLET (300 MG TOTAL) BY MOUTH 2 (TWO) TIMES DAILY. Patient taking differently: Take 300 mg by mouth 2 (two) times daily. TAKE 1 TABLET (300 MG TOTAL) BY MOUTH 2 (TWO) TIMES DAILY. 08/05/15   Irene Shipper, MD  SYNTHROID 88 MCG tablet Take 1 tablet (88 mcg total) by  mouth daily with breakfast. 12/15/16   Philemon Kingdom, MD  Triamcinolone Acetonide (NASACORT ALLERGY 24HR NA) Place 1 spray into the nose daily.    [provider]    Scheduled Meds:  Infusions:  PRN Meds:    Allergies as of 11/02/2017 - Review Complete 11/02/2017  Allergen Reaction Noted  . Augmentin [amoxicillin-pot clavulanate] Other (See Comments) 01/03/2014  . Crestor [rosuvastatin]  05/12/2012  . Demerol [meperidine]  05/21/2014  . Latex Other (See Comments) 04/27/2012  . Meperidine hcl Other (See Comments)   . Morphine and related Itching 05/12/2012  . Pentazocine lactate Nausea And Vomiting   . Simvastatin Other (See Comments)   . Talwin [pentazocine]  05/21/2014  . Codeine Rash     Family History  Problem Relation Age of Onset  . Lung cancer Mother   . Hypertension Mother   . Thyroid disease Mother   . Heart disease Mother   . Skin cancer Father   . Dementia Father   . Arrhythmia Father   . Heart disease Father        Several relatives on dad's side have had MIs/CABG  . Alzheimer's disease Father   . Heart disease Sister   . Thyroid disease Daughter   . Heart disease Maternal Grandfather   . Heart disease Paternal Grandmother   . CAD Sister        MI/stent age 30, CABG age 17  . Thyroid disease Sister     Social History   Socioeconomic History  . Marital status: Single    Spouse name: Not on file  . Number of children: 1  . Years of education: college  . Highest education level: Not on file  Occupational History  . Occupation: caregiver    Employer: NOT EMPLOYED  Social Needs  . Financial resource strain: Not on file  . Food insecurity:    Worry: Not on file    Inability: Not on file  . Transportation needs:    Medical: Not on file    Non-medical: Not on file  Tobacco Use  . Smoking status: Never Smoker  . Smokeless tobacco: Never Used  Substance and Sexual Activity  . Alcohol use: No    Alcohol/week: 0.0 standard drinks  .  Drug use: No  . Sexual activity: Never  Lifestyle  . Physical activity:    Days per week: Not on file    Minutes per session: Not on file  . Stress: Not on file  Relationships  . Social connections:    Talks on phone: Not on file    Gets together: Not on file  Attends religious service: Not on file    Active member of club or organization: Not on file    Attends meetings of clubs or organizations: Not on file    Relationship status: Not on file  . Intimate partner violence:    Fear of current or ex partner: Not on file    Emotionally abused: Not on file    Physically abused: Not on file    Forced sexual activity: Not on file  Other Topics Concern  . Not on file  Social History Narrative  . Not on file    REVIEW OF SYSTEMS: Constitutional: No weakness or fatigue. ENT:  No nose bleeds Pulm: Intermittent problems with environmental allergies and asthma.  Has taken a lot of steroids in her lifetime.  She broke her ribs in April after leaning forward to pick something off the floor.  She is had persistent discomfort in the same area and x-ray within the last couple of weeks showed that the ribs are healing. CV:  No palpitations, no LE edema.  GU:  No hematuria, no frequency GI: Per HPI. Heme: No unusual bleeding or bruising. Transfusions: None recently. Neuro:  No headaches, no peripheral tingling or numbness Derm:  No itching, no rash or sores.  Endocrine:  No sweats or chills.  No polyuria or dysuria Immunization: Not queried. Travel:  None beyond local counties in last few months.    PHYSICAL EXAM: Vital signs in last 24 hours: Vitals:   11/02/17 1400 11/02/17 1600  BP: 131/83 119/78  Pulse: (!) 101 95  Resp: 13 17  Temp:    SpO2: 99% 100%   Wt Readings from Last 3 Encounters:  11/02/17 60.8 kg  06/15/17 60.8 kg  06/07/17 60.8 kg    General: Thin, somewhat frail alert, comfortable older WF. Head: No facial asymmetry or swelling.  No signs of head  trauma. Eyes: No scleral icterus.  No conjunctival pallor. Ears: Hard of hearing. Nose: Congestion or discharge. Mouth: Tongue midline.  Mucosa pink, moist, clear. Neck: No JVD, masses, thyromegaly. Lungs: Clear bilaterally.  No cough or labored breathing. Heart: RRR.  No MRG.  S1, S2 present Abdomen: Soft.  Not distended.  Bowel sounds active.  Slight tenderness in the epigastric lower sternal region..   Rectal: For Musc/Skeltl: No obvious joint deformities other than some mild kyphosis. Extremities: No CCE. Neurologic: Alert.  Oriented x3.  No tremors, no limb weakness. Skin: No rashes or sores. Tattoos: None Nodes: Cervical adenopathy. Psych: Operative, pleasant, fluid speech.  Intake/Output from previous day: No intake/output data recorded. Intake/Output this shift: No intake/output data recorded.  LAB RESULTS: Recent Labs    11/02/17 1421  WBC 6.6  HGB 13.1  HCT 40.2  PLT 200   BMET Lab Results  Component Value Date   NA 138 11/02/2017   NA 138 06/07/2017   NA 141 11/28/2014   K 3.4 (L) 11/02/2017   K 3.5 06/07/2017   K 4.1 11/28/2014   CL 107 11/02/2017   CL 106 06/07/2017   CL 104 11/28/2014   CO2 23 11/02/2017   CO2 24 06/07/2017   CO2 24 11/28/2014   GLUCOSE 113 (H) 11/02/2017   GLUCOSE 122 (H) 06/07/2017   GLUCOSE 79 11/28/2014   BUN 11 11/02/2017   BUN 13 06/07/2017   BUN 10 11/28/2014   CREATININE 0.92 11/02/2017   CREATININE 0.95 06/07/2017   CREATININE 0.81 11/28/2014   CALCIUM 9.1 11/02/2017   CALCIUM 9.5 06/07/2017   CALCIUM 9.2  11/28/2014   LFT Recent Labs    11/02/17 1421  PROT 7.0  ALBUMIN 4.1  AST 23  ALT 18  ALKPHOS 48  BILITOT 0.6      RADIOLOGY STUDIES: Dg Chest 2 View  Result Date: 11/02/2017 CLINICAL DATA:  Globus sensation after eating this morning. Patient reports emesis as result. History of hiatal hernia. EXAM: CHEST - 2 VIEW COMPARISON:  06/07/2017 CXR and CT FINDINGS: The heart size and mediastinal contours  are within normal limits. Small air-fluid level within a hiatal hernia. Both lungs are clear. No evidence of aspiration. No retained foreign body is identified the visualized skeletal structures are unremarkable. Numerous cardiac monitoring leads project over the thorax. IMPRESSION: No active cardiopulmonary disease.  Small hiatal hernia. Electronically Signed   By: Ashley Royalty M.D.   On: 11/02/2017 14:58    IMPRESSION:   *    Acute dysphagia, possible food impaction.  Seems to have improved, possibly resolved after receiving IV glucagon. At latest EGD in 05/2014 she had GERD and multiple distal esophageal rings.  At the time she was not dilated due to inflammation. Fairly chronic dysphagia which she works around.  No history of food impaction.    PLAN:     *     EGD?Marland Kitchen  Earlier today thought that this would be necessary.  However with the resolution of the foreign body sensation, possibly may not need this.  Dr. Hilarie Fredrickson is soon to be on the scene.  He can decide whether to pursue endoscopy and may want to observe her swallowing water prior to deciding.   Azucena Freed  11/02/2017, 4:54 PM Phone [phone]

## 2017-11-02 NOTE — ED Triage Notes (Signed)
Pt arrives to ED from home with complaints of part of a biscuit stuck in her throat since this morning. EMS reports pt feels as though the food bolus has descended slightly, but is feeling a pain in her center chest and is unable to swallow at this time. Pt has hx of hiatal hernia and has had difficulties with similar before but not this bad. Pt maintaining airway. Pt placed in position of comfort with bed locked and lowered, call bell in reach.

## 2017-11-02 NOTE — Op Note (Signed)
[facility] Patient Name: Mary Rubio Procedure Date : 11/02/2017 MRN: [id number] Attending MD: Jerene Bears , MD Date of Birth: 10/17/1945 CSN: [id number] Age: 72 Admit Type: Emergency Department Procedure:                Upper GI endoscopy Indications:              Dysphagia, Foreign body in the esophagus Providers:                Lajuan Lines. Hilarie Fredrickson, MD, Baird Cancer, RN, Angus Seller, Tinnie Gens, Technician, Cherylynn Ridges,                            Technician Referring MD:             Wyn Quaker, PA-C [facility]) Medicines:                Fentanyl 100 micrograms IV, Midazolam 9 mg IV,                            Cetacaine spray Complications:            No immediate complications. Estimated Blood Loss:      Procedure:                Pre-Anesthesia Assessment:                           - Prior to the procedure, a History and Physical                            was performed, and patient medications and                            allergies were reviewed. The patient's tolerance of                            previous anesthesia was also reviewed. The risks                            and benefits of the procedure and the sedation                            options and risks were discussed with the patient.                            All questions were answered, and informed consent                            was obtained. Prior Anticoagulants: The patient has                            taken no previous anticoagulant or antiplatelet  agents. ASA Grade Assessment: II - A patient with                            mild systemic disease. After reviewing the risks                            and benefits, the patient was deemed in                            satisfactory condition to undergo the procedure.                           After obtaining informed consent, the endoscope was   passed under direct vision. Throughout the                            procedure, the patient's blood pressure, pulse, and                            oxygen saturations were monitored continuously. The                            GIF-H190 ([id number]) Olympus Adult EGD was introduced                            through the mouth, and advanced to the second part                            of duodenum. The upper GI endoscopy was                            accomplished without difficulty. The patient                            tolerated the procedure well. Scope In: Scope Out: Findings:      Small amount of food debris was found in the lower third of the       esophagus. This was cleared easily with minimal lavage and suction.      Mucosal changes including ringed esophagus and feline appearance were       found in the entire esophagus. This is very suspicious for EoE.      LA Grade D (one or more mucosal breaks involving at least 75% of       esophageal circumference) esophagitis with contact oozing was found 34       to 35 cm from the incisors.      A 5 cm hiatal hernia was present.      The entire examined stomach was normal, with limitation of retained food       in fundus, proximal gastric body.      The examined duodenum was normal. Impression:               - Food in the lower third of the esophagus, cleared.                           -  Esophageal mucosal changes suggestive of                            eosinophilic esophagitis.                           - LA Grade D reflux esophagitis.                           - 5 cm hiatal hernia.                           - Normal stomach.                           - Normal examined duodenum.                           - No specimens collected. Moderate Sedation:      Moderate (conscious) sedation was administered by the endoscopy nurse       and supervised by the endoscopist. The following parameters were       monitored: oxygen saturation, heart  rate, blood pressure, and response       to care. Total physician intraservice time was 20 minutes. Recommendation:           - Patient has a contact number available for                            emergencies. The signs and symptoms of potential                            delayed complications were discussed with the                            patient. Return to normal activities tomorrow.                            Written discharge instructions were provided to the                            patient.                           - Soft diet.                           - Continue present medications.                           - Begin pantoprazole 40 mg once daily and                            famotidine 40 mg at bedtime.                           - Return to GI office at appointment to be  scheduled with Dr. Henrene Pastor for continuity and                            follow-up EGD with dilation. Procedure Code(s):        --- Professional ---                           [phone], Esophagogastroduodenoscopy, flexible,                            transoral; diagnostic, including collection of                            specimen(s) by brushing or washing, when performed                            (separate procedure)                           G0500, Moderate sedation services provided by the                            same physician or other qualified health care                            professional performing a gastrointestinal                            endoscopic service that sedation supports,                            requiring the presence of an independent trained                            observer to assist in the monitoring of the                            patient's level of consciousness and physiological                            status; initial 15 minutes of intra-service time;                            patient age 67 years or older (additional time may                             be reported with [phone], as appropriate) Diagnosis Code(s):        --- Professional ---                           [phone], Food in esophagus causing other injury,                            initial encounter  K22.8, Other specified diseases of esophagus                           K21.0, Gastro-esophageal reflux disease with                            esophagitis                           K44.9, Diaphragmatic hernia without obstruction or                            gangrene                           R13.10, Dysphagia, unspecified                           T18.108A, Unspecified foreign body in esophagus                            causing other injury, initial encounter CPT copyright 2018 [employer]. All rights reserved. The codes documented in this report are preliminary and upon coder review may  be revised to meet current compliance requirements. Jerene Bears, MD 11/02/2017 6:11:13 PM This report has been signed electronically. Number of Addenda: 0

## 2017-11-03 ENCOUNTER — Encounter (HOSPITAL_COMMUNITY): Payer: Self-pay | Admitting: Internal Medicine

## 2017-11-03 NOTE — Progress Notes (Signed)
Pt scheduled to see Dr. Henrene Pastor 12/01/17@2 :15pm. Appt letter mailed to pt.

## 2017-11-09 DIAGNOSIS — J301 Allergic rhinitis due to pollen: Secondary | ICD-10-CM | POA: Diagnosis not present

## 2017-11-09 DIAGNOSIS — J3089 Other allergic rhinitis: Secondary | ICD-10-CM | POA: Diagnosis not present

## 2017-11-09 DIAGNOSIS — R0602 Shortness of breath: Secondary | ICD-10-CM | POA: Diagnosis not present

## 2017-11-09 DIAGNOSIS — K200 Eosinophilic esophagitis: Secondary | ICD-10-CM | POA: Diagnosis not present

## 2017-11-10 DIAGNOSIS — J3089 Other allergic rhinitis: Secondary | ICD-10-CM | POA: Diagnosis not present

## 2017-11-10 DIAGNOSIS — J301 Allergic rhinitis due to pollen: Secondary | ICD-10-CM | POA: Diagnosis not present

## 2017-11-16 DIAGNOSIS — J3089 Other allergic rhinitis: Secondary | ICD-10-CM | POA: Diagnosis not present

## 2017-11-16 DIAGNOSIS — J301 Allergic rhinitis due to pollen: Secondary | ICD-10-CM | POA: Diagnosis not present

## 2017-11-23 DIAGNOSIS — H40023 Open angle with borderline findings, high risk, bilateral: Secondary | ICD-10-CM | POA: Diagnosis not present

## 2017-11-23 DIAGNOSIS — H16143 Punctate keratitis, bilateral: Secondary | ICD-10-CM | POA: Diagnosis not present

## 2017-11-23 DIAGNOSIS — H40053 Ocular hypertension, bilateral: Secondary | ICD-10-CM | POA: Diagnosis not present

## 2017-11-23 DIAGNOSIS — H2513 Age-related nuclear cataract, bilateral: Secondary | ICD-10-CM | POA: Diagnosis not present

## 2017-11-23 DIAGNOSIS — Z83511 Family history of glaucoma: Secondary | ICD-10-CM | POA: Diagnosis not present

## 2017-11-23 DIAGNOSIS — H25013 Cortical age-related cataract, bilateral: Secondary | ICD-10-CM | POA: Diagnosis not present

## 2017-11-24 DIAGNOSIS — J3089 Other allergic rhinitis: Secondary | ICD-10-CM | POA: Diagnosis not present

## 2017-11-24 DIAGNOSIS — J301 Allergic rhinitis due to pollen: Secondary | ICD-10-CM | POA: Diagnosis not present

## 2017-11-26 DIAGNOSIS — Z23 Encounter for immunization: Secondary | ICD-10-CM | POA: Diagnosis not present

## 2017-11-26 DIAGNOSIS — R1011 Right upper quadrant pain: Secondary | ICD-10-CM | POA: Diagnosis not present

## 2017-12-01 ENCOUNTER — Ambulatory Visit: Payer: Medicare Other | Admitting: Internal Medicine

## 2017-12-09 DIAGNOSIS — J0100 Acute maxillary sinusitis, unspecified: Secondary | ICD-10-CM | POA: Diagnosis not present

## 2017-12-09 DIAGNOSIS — R062 Wheezing: Secondary | ICD-10-CM | POA: Diagnosis not present

## 2017-12-21 DIAGNOSIS — J3089 Other allergic rhinitis: Secondary | ICD-10-CM | POA: Diagnosis not present

## 2017-12-21 DIAGNOSIS — J301 Allergic rhinitis due to pollen: Secondary | ICD-10-CM | POA: Diagnosis not present

## 2017-12-27 DIAGNOSIS — Z83511 Family history of glaucoma: Secondary | ICD-10-CM | POA: Diagnosis not present

## 2017-12-27 DIAGNOSIS — H40023 Open angle with borderline findings, high risk, bilateral: Secondary | ICD-10-CM | POA: Diagnosis not present

## 2018-01-05 ENCOUNTER — Other Ambulatory Visit: Payer: Self-pay | Admitting: Internal Medicine

## 2018-01-11 DIAGNOSIS — J3089 Other allergic rhinitis: Secondary | ICD-10-CM | POA: Diagnosis not present

## 2018-01-11 DIAGNOSIS — J301 Allergic rhinitis due to pollen: Secondary | ICD-10-CM | POA: Diagnosis not present

## 2018-01-21 DIAGNOSIS — J301 Allergic rhinitis due to pollen: Secondary | ICD-10-CM | POA: Diagnosis not present

## 2018-01-21 DIAGNOSIS — J3089 Other allergic rhinitis: Secondary | ICD-10-CM | POA: Diagnosis not present

## 2018-01-24 DIAGNOSIS — M8580 Other specified disorders of bone density and structure, unspecified site: Secondary | ICD-10-CM | POA: Diagnosis not present

## 2018-01-24 DIAGNOSIS — R0781 Pleurodynia: Secondary | ICD-10-CM | POA: Diagnosis not present

## 2018-01-24 DIAGNOSIS — M545 Low back pain: Secondary | ICD-10-CM | POA: Diagnosis not present

## 2018-01-24 DIAGNOSIS — M47816 Spondylosis without myelopathy or radiculopathy, lumbar region: Secondary | ICD-10-CM | POA: Diagnosis not present

## 2018-01-24 DIAGNOSIS — R0789 Other chest pain: Secondary | ICD-10-CM | POA: Diagnosis not present

## 2018-01-24 DIAGNOSIS — M47817 Spondylosis without myelopathy or radiculopathy, lumbosacral region: Secondary | ICD-10-CM | POA: Diagnosis not present

## 2018-01-31 DIAGNOSIS — J301 Allergic rhinitis due to pollen: Secondary | ICD-10-CM | POA: Diagnosis not present

## 2018-01-31 DIAGNOSIS — J3089 Other allergic rhinitis: Secondary | ICD-10-CM | POA: Diagnosis not present

## 2018-02-01 ENCOUNTER — Telehealth: Payer: Self-pay | Admitting: Internal Medicine

## 2018-02-01 NOTE — Telephone Encounter (Signed)
Absolutely okay with me ?

## 2018-02-01 NOTE — Telephone Encounter (Signed)
Dr. Hilarie Fredrickson will you accept this pt? See note below and advise.

## 2018-02-02 DIAGNOSIS — K200 Eosinophilic esophagitis: Secondary | ICD-10-CM | POA: Diagnosis not present

## 2018-02-02 DIAGNOSIS — J301 Allergic rhinitis due to pollen: Secondary | ICD-10-CM | POA: Diagnosis not present

## 2018-02-02 DIAGNOSIS — J3089 Other allergic rhinitis: Secondary | ICD-10-CM | POA: Diagnosis not present

## 2018-02-02 DIAGNOSIS — R0602 Shortness of breath: Secondary | ICD-10-CM | POA: Diagnosis not present

## 2018-02-02 NOTE — Telephone Encounter (Signed)
ok 

## 2018-02-02 NOTE — Telephone Encounter (Signed)
See notes below, ok to switch to Dr. Hilarie Fredrickson.

## 2018-02-07 DIAGNOSIS — J301 Allergic rhinitis due to pollen: Secondary | ICD-10-CM | POA: Diagnosis not present

## 2018-02-07 DIAGNOSIS — J3089 Other allergic rhinitis: Secondary | ICD-10-CM | POA: Diagnosis not present

## 2018-02-18 DIAGNOSIS — J301 Allergic rhinitis due to pollen: Secondary | ICD-10-CM | POA: Diagnosis not present

## 2018-02-18 DIAGNOSIS — J3089 Other allergic rhinitis: Secondary | ICD-10-CM | POA: Diagnosis not present

## 2018-02-21 ENCOUNTER — Encounter: Payer: Self-pay | Admitting: *Deleted

## 2018-02-28 DIAGNOSIS — J3089 Other allergic rhinitis: Secondary | ICD-10-CM | POA: Diagnosis not present

## 2018-02-28 DIAGNOSIS — J301 Allergic rhinitis due to pollen: Secondary | ICD-10-CM | POA: Diagnosis not present

## 2018-03-07 ENCOUNTER — Other Ambulatory Visit: Payer: Medicare Other

## 2018-03-07 DIAGNOSIS — J3089 Other allergic rhinitis: Secondary | ICD-10-CM | POA: Diagnosis not present

## 2018-03-07 DIAGNOSIS — J301 Allergic rhinitis due to pollen: Secondary | ICD-10-CM | POA: Diagnosis not present

## 2018-03-08 DIAGNOSIS — J301 Allergic rhinitis due to pollen: Secondary | ICD-10-CM | POA: Diagnosis not present

## 2018-03-08 DIAGNOSIS — J3089 Other allergic rhinitis: Secondary | ICD-10-CM | POA: Diagnosis not present

## 2018-03-10 DIAGNOSIS — N6311 Unspecified lump in the right breast, upper outer quadrant: Secondary | ICD-10-CM | POA: Diagnosis not present

## 2018-03-10 DIAGNOSIS — N6310 Unspecified lump in the right breast, unspecified quadrant: Secondary | ICD-10-CM | POA: Diagnosis not present

## 2018-03-10 DIAGNOSIS — N6325 Unspecified lump in the left breast, overlapping quadrants: Secondary | ICD-10-CM | POA: Diagnosis not present

## 2018-03-10 DIAGNOSIS — N6320 Unspecified lump in the left breast, unspecified quadrant: Secondary | ICD-10-CM | POA: Diagnosis not present

## 2018-03-14 DIAGNOSIS — J3089 Other allergic rhinitis: Secondary | ICD-10-CM | POA: Diagnosis not present

## 2018-03-14 DIAGNOSIS — J301 Allergic rhinitis due to pollen: Secondary | ICD-10-CM | POA: Diagnosis not present

## 2018-03-16 ENCOUNTER — Encounter: Payer: Self-pay | Admitting: Internal Medicine

## 2018-03-16 ENCOUNTER — Other Ambulatory Visit (INDEPENDENT_AMBULATORY_CARE_PROVIDER_SITE_OTHER): Payer: Medicare Other

## 2018-03-16 ENCOUNTER — Ambulatory Visit (INDEPENDENT_AMBULATORY_CARE_PROVIDER_SITE_OTHER): Payer: Medicare Other | Admitting: Internal Medicine

## 2018-03-16 VITALS — BP 98/62 | HR 68 | Ht 62.5 in | Wt 133.1 lb

## 2018-03-16 DIAGNOSIS — R197 Diarrhea, unspecified: Secondary | ICD-10-CM

## 2018-03-16 DIAGNOSIS — R1013 Epigastric pain: Secondary | ICD-10-CM

## 2018-03-16 DIAGNOSIS — K219 Gastro-esophageal reflux disease without esophagitis: Secondary | ICD-10-CM | POA: Diagnosis not present

## 2018-03-16 DIAGNOSIS — R1310 Dysphagia, unspecified: Secondary | ICD-10-CM

## 2018-03-16 LAB — IGA: IgA: 180 mg/dL (ref 68–378)

## 2018-03-16 MED ORDER — FAMOTIDINE 40 MG PO TABS: 40.0000 mg | ORAL_TABLET | Freq: Every evening | ORAL | 2 refills | Status: DC | PRN

## 2018-03-16 MED ORDER — PANTOPRAZOLE SODIUM 40 MG PO TBEC: 40.0000 mg | DELAYED_RELEASE_TABLET | ORAL | 0 refills | Status: DC

## 2018-03-16 NOTE — Patient Instructions (Addendum)
Your provider has requested that you go to the basement level for lab work before leaving today. Press "B" on the elevator. The lab is located at the first door on the left as you exit the elevator.  We have sent the following medications to your pharmacy for you to pick up at your convenience: Pantoprazole 40 mg every morning  Please purchase the following medications over the counter and take as directed: Pepcid 40 mg every evening as needed  You have been scheduled for an endoscopy. Please follow written instructions given to you at your visit today. If you use inhalers (even only as needed), please bring them with you on the day of your procedure. Your physician has requested that you go to www.startemmi.com and enter the access code given to you at your visit today. This web site gives a general overview about your procedure. However, you should still follow specific instructions given to you by our office regarding your preparation for the procedure.

## 2018-03-16 NOTE — Progress Notes (Signed)
   Subjective:    Patient ID: Mary Rubio, female    DOB: 1945/04/18, 73 y.o.   MRN: [id number]  HPI    Review of Systems     Objective:   Physical Exam        Assessment & Plan:

## 2018-03-16 NOTE — Progress Notes (Signed)
Patient ID: Mary Rubio, female   DOB: [birth year], 73 y.o.   MRN: [id number] HPI: Mary Rubio is a 73 year old female with a past medical history of GERD, esophageal strictures, probable EOE, longstanding IBS, multiple environmental allergies, hyperlipidemia, hypothyroidism who is seen in hospital follow-up.  She is here alone today.  She was previously managed by Dr. Henrene Pastor but was seen by me in the hospital for acute dysphagia and concern for esophageal food impaction.  I performed an upper endoscopy in the emergency department on 11/02/2017.  EGD showed small amount of food debris in the lower esophagus cleared easily with minimal lavage and suction.  There are mucosal changes including ringed esophagus and feline appearance very suspicious for EOE.  There was grade D esophagitis with contact oozing 34 to 35 cm from the incisors.  There was a 5 cm hiatal hernia.  There was retained food in the proximal stomach but the stomach was otherwise normal as was the examined duodenum.  I recommended PPI be started but she was actually started on Pepcid 40 mg written for twice a day but she has been taking once a day around noon every day.  She is having significant issue with heartburn particularly at night and also belching.  She also describes epigastric burning which feels like "fire" and can be very intense.  She reports having early fullness.  She has to eat small meals and chew very well.  Some dysphagia symptom with both solid and liquid.  She is also been having issues with diarrhea predominantly after eating.  Stools are not watery but are loose and urgent.  She can have accidents after eating.  She has had irritable bowel since age 89.  She recalls a remote history of Giardia which she feels was treated.  Symptoms are worse with coffee and tea.  No blood in her stool or melena.  She also reports waking up in the morning with burning mouth discomfort.  She is seen by Dr. Fredderick Phenix with  allergy and asthma and receives immunotherapy.  She has a history of multiple sinus infections which have improved with allergy shots.  Her last colonoscopy was performed on 06/19/2014 by Dr. Henrene Pastor.  This was normal.  This was to the terminal ileum.  Past Medical History:  Diagnosis Date  . Allergy   . Anxiety   . Asthma   . Colon polyps   . Depression   . Diverticulosis   . Environmental allergies    Weekly allergy shots  . Esophageal stricture   . Esophagitis   . GERD (gastroesophageal reflux disease)   . Hiatal hernia   . Hyperlipidemia   . Hypothyroidism   . IBS (irritable bowel syndrome)   . Osteopenia   . Osteoporosis     Past Surgical History:  Procedure Laterality Date  . ABDOMINAL HYSTERECTOMY    . BARTHOLIN GLAND CYST EXCISION    . BUNIONECTOMY Right   . CHOLECYSTECTOMY    . ESOPHAGOGASTRODUODENOSCOPY N/A 10/21/2012   Procedure: ESOPHAGOGASTRODUODENOSCOPY (EGD);  Surgeon: Beryle Beams, MD;  Location: [facility];  Service: Endoscopy;  Laterality: N/A;  . ESOPHAGOGASTRODUODENOSCOPY N/A 11/02/2017   Procedure: ESOPHAGOGASTRODUODENOSCOPY (EGD);  Surgeon: Jerene Bears, MD;  Location: [facility] ENDOSCOPY;  Service: Gastroenterology;  Laterality: N/A;  . EXPLORATORY LAPAROTOMY     endometriosis  . facial cyst     excision  . NASAL SEPTUM SURGERY    . thumb surgery Left    for cyst    Outpatient Medications Prior to  Visit  Medication Sig Dispense Refill  . albuterol (PROVENTIL HFA;VENTOLIN HFA) 108 (90 Base) MCG/ACT inhaler Inhale 2 puffs into the lungs every 4 (four) hours as needed.     Marland Kitchen azelastine (OPTIVAR) 0.05 % ophthalmic solution Place 1 drop into both eyes daily.     . cholecalciferol (VITAMIN D) 1000 units tablet Take 2,000 Units by mouth daily.    Marland Kitchen denosumab (PROLIA) 60 MG/ML SOSY injection Inject 60 mg into the skin every 6 (six) months.    . EPINEPHrine (EPIPEN 2-PAK) 0.3 mg/0.3 mL IJ SOAJ injection Inject 0.3 mg into the muscle once.    . NON FORMULARY  once a week. Allergy shots    . SYNTHROID 88 MCG tablet TAKE ONE TABLET BY MOUTH DAILY WITH BREAKFAST AS DIRECTED 90 tablet 2  . Triamcinolone Acetonide (NASACORT ALLERGY 24HR NA) Place 1 spray into the nose daily.    Vladimir Faster Glycol-Propyl Glycol (SYSTANE OP) Place 1 drop into both eyes as needed.     . ranitidine (ZANTAC) 300 MG tablet TAKE 1 TABLET (300 MG TOTAL) BY MOUTH 2 (TWO) TIMES DAILY. (Patient taking differently: Take 300 mg by mouth 2 (two) times daily. TAKE 1 TABLET (300 MG TOTAL) BY MOUTH 2 (TWO) TIMES DAILY.) 60 tablet 5  . famotidine (PEPCID) 40 MG tablet Take 1 tablet (40 mg total) by mouth 2 (two) times daily. 60 tablet 0   No facility-administered medications prior to visit.     Allergies  Allergen Reactions  . Augmentin [Amoxicillin-Pot Clavulanate] Other (See Comments)    Stomach pain   . Crestor [Rosuvastatin]     Myalgias  . Demerol [Meperidine]   . Latex Other (See Comments)    Canker sores in mouth (discovered by dentist)  . Meperidine Hcl Other (See Comments)    Does not remember   . Morphine And Related Itching    Has tolerated small doses of hydrocodone  . Pentazocine Lactate Nausea And Vomiting    Sick on stomach  . Simvastatin Other (See Comments)    Muscle aches   . Talwin [Pentazocine]   . Codeine Rash    Has tolerated small doses of hydrocodone    Family History  Problem Relation Age of Onset  . Lung cancer Mother   . Hypertension Mother   . Thyroid disease Mother   . Heart disease Mother   . Skin cancer Father   . Dementia Father   . Arrhythmia Father   . Heart disease Father        Several relatives on dad's side have had MIs/CABG  . Alzheimer's disease Father   . Heart disease Sister   . Thyroid disease Daughter   . Heart disease Maternal Grandfather   . Heart disease Paternal Grandmother   . CAD Sister        MI/stent age 51, CABG age 49  . Thyroid disease Sister     Social History   Tobacco Use  . Smoking status: Never  Smoker  . Smokeless tobacco: Never Used  Substance Use Topics  . Alcohol use: No    Alcohol/week: 0.0 standard drinks  . Drug use: No    ROS: As per history of present illness, otherwise negative  BP 98/62   Pulse 68   Ht 5' 2.5" (1.588 m)   Wt 133 lb 2 oz (60.4 kg)   BMI 23.96 kg/m  Constitutional: Well-developed and well-nourished. No distress. HEENT: Normocephalic and atraumatic. Oropharynx is clear and moist. Conjunctivae are normal.  No scleral icterus. Neck: Neck supple. Trachea midline. Cardiovascular: Normal rate, regular rhythm and intact distal pulses. No M/R/G Pulmonary/chest: Effort normal and breath sounds normal. No wheezing, rales or rhonchi. Abdominal: Soft, nontender, nondistended. Bowel sounds active throughout. There are no masses palpable. No hepatosplenomegaly. Extremities: no clubbing, cyanosis, or edema Neurological: Alert and oriented to person place and time. Skin: Skin is warm and dry.  Psychiatric: Normal mood and affect. Behavior is normal.  RELEVANT LABS AND IMAGING: CBC    Component Value Date/Time   WBC 6.6 11/02/2017 1421   RBC 4.34 11/02/2017 1421   HGB 13.1 11/02/2017 1421   HCT 40.2 11/02/2017 1421   PLT 200 11/02/2017 1421   MCV 92.6 11/02/2017 1421   MCV 92.0 08/14/2014 1752   MCH 30.2 11/02/2017 1421   MCHC 32.6 11/02/2017 1421   RDW 12.3 11/02/2017 1421   LYMPHSABS 2.0 06/07/2017 1533   MONOABS 0.6 06/07/2017 1533   EOSABS 0.2 06/07/2017 1533   BASOSABS 0.0 06/07/2017 1533    CMP     Component Value Date/Time   NA 138 11/02/2017 1421   K 3.4 (L) 11/02/2017 1421   CL 107 11/02/2017 1421   CO2 23 11/02/2017 1421   GLUCOSE 113 (H) 11/02/2017 1421   BUN 11 11/02/2017 1421   CREATININE 0.92 11/02/2017 1421   CREATININE 0.81 11/28/2014 1227   CALCIUM 9.1 11/02/2017 1421   PROT 7.0 11/02/2017 1421   ALBUMIN 4.1 11/02/2017 1421   AST 23 11/02/2017 1421   ALT 18 11/02/2017 1421   ALKPHOS 48 11/02/2017 1421   BILITOT 0.6  11/02/2017 1421   GFRNONAA >60 11/02/2017 1421   GFRNONAA 66 03/08/2013 1551   GFRAA >60 11/02/2017 1421   GFRAA 77 03/08/2013 1551    ASSESSMENT/PLAN: 73 year old female with a past medical history of GERD, esophageal strictures, probable EOE, longstanding IBS, multiple environmental allergies, hyperlipidemia, hypothyroidism who is seen in hospital follow-up.   1.  Epigastric abdominal pain/dysphagia/history of food impaction/probable EOE -she has not been on PPI despite my recommendation on discharge from the hospital.  She is only been on H2 blocker once a day which I explained is incomplete acid suppression.  Given her severity of esophagitis and probable EOE PPI is recommended.  Begin pantoprazole 40 mg once daily before breakfast.  Can use famotidine 20 to 40 mg in the evening as needed for breakthrough heartburn symptom.  Upper endoscopy is recommended to biopsy the esophagus for definitive EOE diagnosis, but also plan to biopsy the stomach and small bowel to exclude eosinophilic gastritis and enteritis, respectively.  May also require dilation.  We discussed the risk, benefits and alternatives to upper endoscopy and she is agreeable and wishes to proceed.  Check celiac panel  2.  Diarrhea --possible IBS related but exclude infection.  Check GI pathogen panel and fecal elastase.  Also checking celiac panel as above.  She is up-to-date with colonoscopy.   WC:[facility], [address], [address], [place] [address]

## 2018-03-17 LAB — TISSUE TRANSGLUTAMINASE, IGA: (tTG) Ab, IgA: 1 U/mL

## 2018-03-21 DIAGNOSIS — J3089 Other allergic rhinitis: Secondary | ICD-10-CM | POA: Diagnosis not present

## 2018-03-21 DIAGNOSIS — J301 Allergic rhinitis due to pollen: Secondary | ICD-10-CM | POA: Diagnosis not present

## 2018-03-25 DIAGNOSIS — E039 Hypothyroidism, unspecified: Secondary | ICD-10-CM | POA: Diagnosis not present

## 2018-03-28 DIAGNOSIS — J301 Allergic rhinitis due to pollen: Secondary | ICD-10-CM | POA: Diagnosis not present

## 2018-03-28 DIAGNOSIS — J3089 Other allergic rhinitis: Secondary | ICD-10-CM | POA: Diagnosis not present

## 2018-04-01 ENCOUNTER — Encounter: Payer: Medicare Other | Admitting: Internal Medicine

## 2018-04-04 DIAGNOSIS — J3089 Other allergic rhinitis: Secondary | ICD-10-CM | POA: Diagnosis not present

## 2018-04-04 DIAGNOSIS — J301 Allergic rhinitis due to pollen: Secondary | ICD-10-CM | POA: Diagnosis not present

## 2018-04-08 ENCOUNTER — Telehealth: Payer: Self-pay | Admitting: *Deleted

## 2018-04-08 NOTE — Telephone Encounter (Signed)
Patient has called and cancelled procedure at this time.

## 2018-04-08 NOTE — Telephone Encounter (Signed)
I have left a message for patient to call back.  I need to advise that unfortunately, [facility] has requested that all non-emergent or non life threatening cases be rescheduled at this time due to COVID-19 pandemic.   She is currently scheduled for 04/11/18 endoscopy but this will be cancelled and rescheduled at a later date when a schedule becomes available.

## 2018-04-11 ENCOUNTER — Encounter: Payer: Medicare Other | Admitting: Internal Medicine

## 2018-04-13 DIAGNOSIS — J3089 Other allergic rhinitis: Secondary | ICD-10-CM | POA: Diagnosis not present

## 2018-04-13 DIAGNOSIS — J301 Allergic rhinitis due to pollen: Secondary | ICD-10-CM | POA: Diagnosis not present

## 2018-04-20 DIAGNOSIS — Z0000 Encounter for general adult medical examination without abnormal findings: Secondary | ICD-10-CM | POA: Diagnosis not present

## 2018-04-21 DIAGNOSIS — J301 Allergic rhinitis due to pollen: Secondary | ICD-10-CM | POA: Diagnosis not present

## 2018-04-21 DIAGNOSIS — J3089 Other allergic rhinitis: Secondary | ICD-10-CM | POA: Diagnosis not present

## 2018-04-27 DIAGNOSIS — J3089 Other allergic rhinitis: Secondary | ICD-10-CM | POA: Diagnosis not present

## 2018-04-27 DIAGNOSIS — J301 Allergic rhinitis due to pollen: Secondary | ICD-10-CM | POA: Diagnosis not present

## 2018-05-04 DIAGNOSIS — J301 Allergic rhinitis due to pollen: Secondary | ICD-10-CM | POA: Diagnosis not present

## 2018-05-04 DIAGNOSIS — J3089 Other allergic rhinitis: Secondary | ICD-10-CM | POA: Diagnosis not present

## 2018-05-06 DIAGNOSIS — J0100 Acute maxillary sinusitis, unspecified: Secondary | ICD-10-CM | POA: Diagnosis not present

## 2018-05-19 ENCOUNTER — Telehealth: Payer: Self-pay | Admitting: *Deleted

## 2018-05-19 NOTE — Telephone Encounter (Signed)
Per Dr. Hilarie Fredrickson, pt is to have her EGD at this time.  When I called to schedule this, she states she has been "ill for more than 2 weeks."  She states she was on antibiotic for a sinus infection, but is still feeling badly.  She does not want to schedule at this time; she will call back to schedule appointment.

## 2018-05-23 DIAGNOSIS — J301 Allergic rhinitis due to pollen: Secondary | ICD-10-CM | POA: Diagnosis not present

## 2018-05-23 DIAGNOSIS — J3089 Other allergic rhinitis: Secondary | ICD-10-CM | POA: Diagnosis not present

## 2018-06-02 DIAGNOSIS — J0101 Acute recurrent maxillary sinusitis: Secondary | ICD-10-CM | POA: Diagnosis not present

## 2018-06-12 ENCOUNTER — Other Ambulatory Visit: Payer: Self-pay | Admitting: Internal Medicine

## 2018-06-15 ENCOUNTER — Ambulatory Visit (INDEPENDENT_AMBULATORY_CARE_PROVIDER_SITE_OTHER): Payer: Medicare Other | Admitting: Internal Medicine

## 2018-06-15 ENCOUNTER — Other Ambulatory Visit: Payer: Self-pay

## 2018-06-15 ENCOUNTER — Encounter: Payer: Self-pay | Admitting: Internal Medicine

## 2018-06-15 DIAGNOSIS — M8589 Other specified disorders of bone density and structure, multiple sites: Secondary | ICD-10-CM

## 2018-06-15 DIAGNOSIS — E559 Vitamin D deficiency, unspecified: Secondary | ICD-10-CM

## 2018-06-15 DIAGNOSIS — E039 Hypothyroidism, unspecified: Secondary | ICD-10-CM

## 2018-06-15 MED ORDER — SYNTHROID 88 MCG PO TABS: ORAL_TABLET | ORAL | 3 refills | Status: DC

## 2018-06-15 NOTE — Patient Instructions (Signed)
Please continue vitamin D 2000 units daily.  Continue Prolia at Dr. Chales Abrahams office.  Please come back for a follow-up appointment in 1 year.

## 2018-06-15 NOTE — Progress Notes (Signed)
Patient ID: Mary Rubio, female   DOB: 05/16/1945, 73 y.o.   MRN: [id number]  Patient location: Home My location: Office  Referring Provider: Bernerd Limbo, MD  I connected with the patient on 06/15/18 at 10:33 AM EDT by telephone and verified that I am speaking with the correct person.   I discussed the limitations of evaluation and management by telephone and the availability of in person appointments. The patient expressed understanding and agreed to proceed.   Details of the encounter are shown below.  HPI  Mary Rubio is a 73 y.o.-year-old female, retruning for f/u for hypothyroidism, dx 1996, vitamin D deficiency, and osteopenia. Last visit 1 year ago. New PCP: Dr. Drema Dallas.  She had a prolonged sinus infection. She had several ABx courses.  Hypothyroidism:  Pt is on Synthroid 88 Mcg daily, taken: - in am - fasting - ~2 hours from b'fast - no Ca, Fe, MVI, PPIs - not on Biotin  Reviewed patient's TFTs: 03/25/2018: TSH 1.56, free T4 1.47 Lab Results  Component Value Date   TSH 0.70 06/15/2017   TSH 3.79 12/14/2016   TSH 1.03 06/09/2016   TSH 0.48 03/05/2016   TSH 0.10 (L) 01/15/2016   TSH 0.90 04/11/2015   TSH 5.04 (H) 02/28/2015   TSH 6.675 (H) 11/28/2014   TSH 2.36 06/29/2014   TSH 1.84 02/27/2014   FREET4 1.13 06/15/2017   FREET4 0.92 12/14/2016   FREET4 1.08 06/09/2016   FREET4 1.00 03/05/2016   FREET4 1.43 01/15/2016   FREET4 1.18 04/11/2015   FREET4 0.94 02/28/2015   FREET4 1.09 11/28/2014   FREET4 0.85 06/29/2014   FREET4 0.99 02/27/2014  12/06/2015: TSH 0.103 08/17/2013: TSH 3.45, fT4 0.70 >> she was advised to increase the LT4 to 125 mcg, but she did not do it. She feels better on 112 mcg.  07/04/2013: TSH 6.81 >> dose of LT4 increased from 100 to 112 mcg  She had a h/o radiation tx to head or neck (for ear infection >> this was an experimental treatment with several sessions).  Vitamin D insufficiency:  Lab Results  Component Value  Date   VD25OH 35.36 06/15/2017   VD25OH 34.94 12/14/2016   VD25OH 27.42 (L) 06/09/2016   VD25OH 31.73 03/05/2016   VD25OH 29.41 (L) 01/15/2016   VD25OH 50.51 02/28/2015   VD25OH 23.35 (L) 07/24/2014   We started vitamin D supplementation (4000 units daily) in 2016.  She is now taking 2000 units approximately once a week.  Osteopenia:  In 04/2017 she developed multiple rib fractures from bending over (nontraumatic).  She was seen for this at [facility] and, in 07/2017, it was suggested that she started Prolia.  She ended up not starting this, but finally was able to start in 01/2018 - at [facility].  Reviewed her DXA scan reports:  DXA scan -[facility] (06/21/2017): L1-L4: -2.0 (-2%) RFN: -1.9, LFN: -2.0             FRAX: MOF 18%, Hip: 3.2%  DXA scan -[facility] (06/18/2015): L1-L4: -1.9 RFN: -2.0, LFN: -2.1  FRAX: MOF 16%, Hip: 2.7%  DXA scan (06/06/2013): L1-L4: -1.2 RFN: -1.9, LFN: -1.7 FRAX: MOF 16.4%, Hip: 2.3%  After the DXA scan from 2017, we discussed to normalize her vitamin D level, start weightbearing exercises and repeat the scan in 2019.  After the scan in 2019, I sent her a message suggesting Prolia.  She did not read the message.  She has severe esophageal reflux/Es rings and strictures.  She also has a recent episode  of esophageal obstruction due to food impaction 10/2017.  She tried Fosamax 25 years ago but could not tolerate it.  She has a history of environmental allergies, on allergy shots.  Her father died in 09-20-2016 >> he was in hospice. Sister has Multiple Myeloma. Her stem cell transplant failed.   ROS: Constitutional: no weight gain/no weight loss, no fatigue, no subjective hyperthermia, no subjective hypothermia Eyes: no blurry vision, no xerophthalmia ENT: no sore throat, no nodules palpated in neck, no dysphagia, no odynophagia, no hoarseness Cardiovascular: no CP/no SOB/no palpitations/no leg swelling Respiratory: no cough/no SOB/no wheezing Gastrointestinal:  no N/no V/no D/no C/no acid reflux Musculoskeletal: no muscle aches/+ joint aches Skin: no rashes, no hair loss Neurological: no tremors/no numbness/no tingling/no dizziness  I reviewed pt's medications, allergies, PMH, social hx, family hx, and changes were documented in the history of present illness. Otherwise, unchanged from my initial visit note.  Past Medical History:  Diagnosis Date  . Allergy   . Anxiety   . Asthma   . Colon polyps   . Depression   . Diverticulosis   . Environmental allergies    Weekly allergy shots  . Esophageal stricture   . Esophagitis   . GERD (gastroesophageal reflux disease)   . Hiatal hernia   . Hyperlipidemia   . Hypothyroidism   . IBS (irritable bowel syndrome)   . Osteopenia   . Osteoporosis    Past Surgical History:  Procedure Laterality Date  . ABDOMINAL HYSTERECTOMY    . BARTHOLIN GLAND CYST EXCISION    . BUNIONECTOMY Right   . CHOLECYSTECTOMY    . ESOPHAGOGASTRODUODENOSCOPY N/A 10/21/2012   Procedure: ESOPHAGOGASTRODUODENOSCOPY (EGD);  Surgeon: Beryle Beams, MD;  Location: [facility]OS[facility];  Service: Endoscopy;  Laterality: N/A;  . ESOPHAGOGASTRODUODENOSCOPY N/A 11/02/2017   Procedure: ESOPHAGOGASTRODUODENOSCOPY (EGD);  Surgeon: Jerene Bears, MD;  Location: [facility] ENDOSCOPY;  Service: Gastroenterology;  Laterality: N/A;  . EXPLORATORY LAPAROTOMY     endometriosis  . facial cyst     excision  . NASAL SEPTUM SURGERY    . thumb surgery Left    for cyst   History   Social History  . Marital Status: Single    Spouse Name: N/A    Number of Children: 1  . Years of Education: college   Occupational History  . caregiver    Social History Main Topics  . Smoking status: Never Smoker   . Smokeless tobacco: No  . Alcohol Use: No  . Drug Use: No   Current Outpatient Medications on File Prior to Visit  Medication Sig Dispense Refill  . albuterol (PROVENTIL HFA;VENTOLIN HFA) 108 (90 Base) MCG/ACT inhaler Inhale 2 puffs into the lungs  every 4 (four) hours as needed.     Marland Kitchen azelastine (OPTIVAR) 0.05 % ophthalmic solution Place 1 drop into both eyes daily.     . cholecalciferol (VITAMIN D) 1000 units tablet Take 2,000 Units by mouth daily.    Marland Kitchen denosumab (PROLIA) 60 MG/ML SOSY injection Inject 60 mg into the skin every 6 (six) months.    . EPINEPHrine (EPIPEN 2-PAK) 0.3 mg/0.3 mL IJ SOAJ injection Inject 0.3 mg into the muscle once.    . famotidine (PEPCID) 40 MG tablet TAKE ONE TABLET BY MOUTH EVERY NIGHT AT BEDTIME AS NEEDED FOR UP TO 30 DAYS FOR HEARTBURN OR INDIGESTION 90 tablet 0  . NON FORMULARY once a week. Allergy shots    . pantoprazole (PROTONIX) 40 MG tablet TAKE ONE TABLET BY MOUTH EVERY MORNING  90 tablet 0  . SYNTHROID 88 MCG tablet TAKE ONE TABLET BY MOUTH DAILY WITH BREAKFAST AS DIRECTED 90 tablet 2  . Triamcinolone Acetonide (NASACORT ALLERGY 24HR NA) Place 1 spray into the nose daily.     No current facility-administered medications on file prior to visit.    Allergies  Allergen Reactions  . Augmentin [Amoxicillin-Pot Clavulanate] Other (See Comments)    Stomach pain   . Crestor [Rosuvastatin]     Myalgias  . Demerol [Meperidine]   . Latex Other (See Comments)    Canker sores in mouth (discovered by dentist)  . Meperidine Hcl Other (See Comments)    Does not remember   . Morphine And Related Itching    Has tolerated small doses of hydrocodone  . Pentazocine Lactate Nausea And Vomiting    Sick on stomach  . Simvastatin Other (See Comments)    Muscle aches   . Talwin [Pentazocine]   . Codeine Rash    Has tolerated small doses of hydrocodone   Family History  Problem Relation Age of Onset  . Lung cancer Mother   . Hypertension Mother   . Thyroid disease Mother   . Heart disease Mother   . Skin cancer Father   . Dementia Father   . Arrhythmia Father   . Heart disease Father        Several relatives on dad's side have had MIs/CABG  . Alzheimer's disease Father   . Heart disease Sister   .  Thyroid disease Daughter   . Heart disease Maternal Grandfather   . Heart disease Paternal Grandmother   . CAD Sister        MI/stent age 44, CABG age 54  . Thyroid disease Sister    PE: There were no vitals taken for this visit. There is no height or weight on file to calculate BMI. Wt Readings from Last 3 Encounters:  03/16/18 133 lb 2 oz (60.4 kg)  11/02/17 134 lb (60.8 kg)  06/15/17 134 lb (60.8 kg)   Constitutional:  in NAD  The physical exam was not performed (telephone visit).  ASSESSMENT: 1. Hypothyroidism - Thyroid U/S (03/17/2013) - a nonenlarged thyroid, with very small hypoechoic regions:  Right lobe: 4.2 x 1.5 x 1.2 cm. There are multiple hypoechogenic to anechoic nodules, measuring under 2 mm in diameter  Left lobe: 3.9 x 1.3 x 1.5 cm. There are hypoechoic nodules in the mid pole and lower pole of the left thyroid lobe, with the largest nodule measuring 4 mm in diameter. There is a minimal 1 x 3 mm diameter shadowing calcification in the midpole >> no goiter, small hypoechoic areas, all <4 mm >> no follow up is needed  - Thyroid U/S (07/10/2016) - Nodule # 1: Location: Left; Mid Maximum size: 0.4 cm; Other 2 dimensions: 0.2 cm x 0.4 cm Composition: cannot determine (2) Echogenicity: hyperechoic (1) Echogenic foci: macrocalcifications (1)  ACR TI-RADS recommendations: Nodule does not meet criteria for surveillance or biopsy _______________________________________________ No adenopathy No thyroid nodule meets criteria for biopsy or surveillance, as designated by the newly established ACR TI-RADS criteria.  2. Osteopenia  3.  Vitamin D deficiency  PLAN:  1. Hypothyroidism - latest thyroid labs reviewed with pt >> normal a year ago and she had a repeat in 03/2018 at [facility]: Normal - she continues on Synthroid d.a.w. 88 Mcg daily - pt feels good on this dose. - we discussed about taking the thyroid hormone every day, with water, >30 minutes before  breakfast, separated by >4 hours from acid reflux medications, calcium, iron, multivitamins. Pt. is taking it correctly. - I will see her in a year  2. Osteopenia -She had a rib fracture in 2016, and also broke 2 ribs in 04/2017, from bending over -We again discussed that the osteopenia may be related to her previous extensive steroid use -Comparing the DEXA scan from 2017 in 2019, the hip fracture risk score is worse >> at that time, I suggested Prolia.  I sent her a message through my chart regarding this, but she apparently did not read it.  However, she was seen at [facility] in 07/2017 and at that time Prolia was suggested. She wanted to think about it and she got the first injection in 01/2018. -She will be due for another bone density scan in 2021 at [facility]  3.  Vitamin D deficiency -She continues on vitamin D 2000 units daily -At last visit, vitamin D level was normal.  Will repeat when she returns to the clinic or at next appt with PCP in 09/2018.  - time spent with the patient: 23 min, of which >50% was spent in obtaining information about her symptoms, reviewing her previous labs, evaluations, and treatments, counseling her about her condition (please see the discussed topics above), and developing a plan to further investigate and treat it; she had a number of questions which I addressed.  Philemon Kingdom, MD PhD [facility] Endocrinology

## 2018-07-29 DIAGNOSIS — M8580 Other specified disorders of bone density and structure, unspecified site: Secondary | ICD-10-CM | POA: Diagnosis not present

## 2018-09-05 ENCOUNTER — Other Ambulatory Visit: Payer: Self-pay

## 2018-09-05 ENCOUNTER — Ambulatory Visit (AMBULATORY_SURGERY_CENTER): Payer: Self-pay | Admitting: *Deleted

## 2018-09-05 VITALS — Temp 97.1°F | Ht 62.5 in | Wt 134.0 lb

## 2018-09-05 DIAGNOSIS — R1310 Dysphagia, unspecified: Secondary | ICD-10-CM

## 2018-09-05 DIAGNOSIS — K219 Gastro-esophageal reflux disease without esophagitis: Secondary | ICD-10-CM

## 2018-09-05 NOTE — Progress Notes (Signed)
Patient is here for PV today. Patient denies any allergies to eggs or soy. Patient denies any problems with anesthesia/sedation. Patient denies any oxygen use at home. Patient denies taking any diet/weight loss medications or blood thinners. Pt is aware that care partner will wait in the car during procedure; if they feel like they will be too hot to wait in the car; they may wait in the lobby.  We want them to wear a mask (we do not have any that we can provide them), practice social distancing, and we will check their temperatures when they get here.  I did remind patient that their care partner needs to stay in the parking lot the entire time. Pt will wear mask into building.

## 2018-09-13 ENCOUNTER — Encounter: Payer: Self-pay | Admitting: Internal Medicine

## 2018-09-16 ENCOUNTER — Telehealth: Payer: Self-pay | Admitting: Internal Medicine

## 2018-09-16 NOTE — Telephone Encounter (Signed)
Spoke with patient regarding Covid-19 screening questions ° °Do you now or have you had a fever in the last 14 days?    no  °  °Do you have any respiratory symptoms of shortness of breath or cough now or in the last 14 days?    no °  °Do you have any family members or close contacts with diagnosed or suspected Covid-19 in the past 14 days?     no °  °Have you been tested for Covid-19 and found to be positive?    no °  °Please  make patient aware that care partner may wait in the car or come up to the lobby during the procedure but will need to provide their own mask. ° °

## 2018-09-19 ENCOUNTER — Encounter: Payer: Self-pay | Admitting: Internal Medicine

## 2018-09-19 ENCOUNTER — Ambulatory Visit (AMBULATORY_SURGERY_CENTER): Payer: Medicare Other | Admitting: Internal Medicine

## 2018-09-19 ENCOUNTER — Other Ambulatory Visit: Payer: Self-pay

## 2018-09-19 VITALS — BP 127/82 | HR 95 | Temp 98.3°F | Resp 15 | Ht 62.5 in | Wt 134.0 lb

## 2018-09-19 DIAGNOSIS — R1310 Dysphagia, unspecified: Secondary | ICD-10-CM

## 2018-09-19 DIAGNOSIS — K200 Eosinophilic esophagitis: Secondary | ICD-10-CM | POA: Diagnosis not present

## 2018-09-19 DIAGNOSIS — K222 Esophageal obstruction: Secondary | ICD-10-CM | POA: Diagnosis not present

## 2018-09-19 DIAGNOSIS — K449 Diaphragmatic hernia without obstruction or gangrene: Secondary | ICD-10-CM | POA: Diagnosis not present

## 2018-09-19 DIAGNOSIS — K219 Gastro-esophageal reflux disease without esophagitis: Secondary | ICD-10-CM | POA: Diagnosis not present

## 2018-09-19 DIAGNOSIS — K2970 Gastritis, unspecified, without bleeding: Secondary | ICD-10-CM

## 2018-09-19 DIAGNOSIS — K2950 Unspecified chronic gastritis without bleeding: Secondary | ICD-10-CM | POA: Diagnosis not present

## 2018-09-19 DIAGNOSIS — R1013 Epigastric pain: Secondary | ICD-10-CM

## 2018-09-19 DIAGNOSIS — J45909 Unspecified asthma, uncomplicated: Secondary | ICD-10-CM | POA: Diagnosis not present

## 2018-09-19 MED ORDER — PANTOPRAZOLE SODIUM 40 MG PO TBEC: 40.0000 mg | DELAYED_RELEASE_TABLET | Freq: Two times a day (BID) | ORAL | 5 refills | Status: DC

## 2018-09-19 MED ORDER — SODIUM CHLORIDE 0.9 % IV SOLN: 500.0000 mL | Freq: Once | INTRAVENOUS | Status: DC

## 2018-09-19 NOTE — Progress Notes (Signed)
Report to PACU, RN, vss, BBS= Clear.  

## 2018-09-19 NOTE — Progress Notes (Signed)
Called to room to assist during endoscopic procedure.  Patient ID and intended procedure confirmed with present staff. Received instructions for my participation in the procedure from the performing physician.  

## 2018-09-19 NOTE — Progress Notes (Signed)
Pt's states no medical or surgical changes since previsit or office visit.  Temp taken by JB VS taken by [facility]

## 2018-09-19 NOTE — Op Note (Signed)
[place] Patient Name: Mary Rubio Procedure Date: 09/19/2018 9:15 AM MRN: [id number] Endoscopist: Jerene Bears , MD Age: 73 Referring MD:  Date of Birth: 03-25-1945 Gender: Female Account #: [id number] Procedure:                Upper GI endoscopy Indications:              Epigastric abdominal pain, Dysphagia,                            Gastro-esophageal reflux disease (esophageal food                            impaction in Feb 2020, patient reports ongoing                            heartburn, regurgitation, throat clearing,                            hoarseness, dysphagia and epigastric abdominal pain                            despite pantoprazole 40 mg daily) Medicines:                Monitored Anesthesia Care Procedure:                Pre-Anesthesia Assessment:                           - Prior to the procedure, a History and Physical                            was performed, and patient medications and                            allergies were reviewed. The patient's tolerance of                            previous anesthesia was also reviewed. The risks                            and benefits of the procedure and the sedation                            options and risks were discussed with the patient.                            All questions were answered, and informed consent                            was obtained. Prior Anticoagulants: The patient has                            taken no previous anticoagulant or antiplatelet  agents. ASA Grade Assessment: II - A patient with                            mild systemic disease. After reviewing the risks                            and benefits, the patient was deemed in                            satisfactory condition to undergo the procedure.                           After obtaining informed consent, the endoscope was                            passed under direct vision.  Throughout the                            procedure, the patient's blood pressure, pulse, and                            oxygen saturations were monitored continuously. The                            Endoscope was introduced through the mouth, and                            advanced to the second part of duodenum. The upper                            GI endoscopy was accomplished without difficulty.                            The patient tolerated the procedure well. Scope In: Scope Out: Findings:                 LA Grade A (one or more mucosal breaks less than 5                            mm, not extending between tops of 2 mucosal folds)                            esophagitis with no bleeding was found in the lower                            third of the esophagus.                           Mucosal changes including longitudinal furrows and                            white plaques were found in the entire esophagus.  Biopsies were obtained from the proximal and distal                            esophagus with cold forceps for histology to                            exclude eosinophilic esophagitis.                           A 2 cm hiatal hernia was found. The proximal extent                            of the gastric folds (end of tubular esophagus) was                            40 cm from the incisors. The hiatal narrowing was                            38 cm from the incisors. The Z-line was 38 cm from                            the incisors.                           One benign-appearing, intrinsic mild                            (non-circumferential scarring) stenosis was found                            38 cm from the incisors. This stenosis measured                            less than one cm (in length). The stenosis was                            traversed. A TTS dilator was passed through the                            scope. Dilation with a  13.5-14.5-15.5 mm balloon                            dilator was performed to 15.5 mm. The dilation site                            was examined and showed moderate mucosal disruption.                           The entire examined stomach was normal. Biopsies                            were taken with a cold forceps for histology and  Helicobacter pylori testing.                           The examined duodenum was normal. Complications:            No immediate complications. Estimated Blood Loss:     Estimated blood loss was minimal. Impression:               - LA Grade A reflux esophagitis with mucosal                            changes suggestive of eosinophilic esophagitis.                            Biopsied.                           - 2 cm hiatal hernia.                           - Benign-appearing esophageal stenosis. Dilated to                            15.5 mm.                           - Normal stomach. Biopsied.                           - Normal examined duodenum. Recommendation:           - Patient has a contact number available for                            emergencies. The signs and symptoms of potential                            delayed complications were discussed with the                            patient. Return to normal activities tomorrow.                            Written discharge instructions were provided to the                            patient.                           - Resume previous diet.                           - Continue present medications.                           - Increase pantoprazole to 40 mg twice daily before                            meals.  Reflux precautions.                           - Await pathology results.                           - Office follow-up next available. Jerene Bears, MD 09/19/2018 9:46:50 AM This report has been signed electronically.

## 2018-09-19 NOTE — Progress Notes (Signed)
No problems noted in the recovery room. maw 

## 2018-09-19 NOTE — Patient Instructions (Signed)
YOU HAD AN ENDOSCOPIC PROCEDURE TODAY AT [facility]:   Refer to the procedure report that was given to you for any specific questions about what was found during the examination.  If the procedure report does not answer your questions, please call your gastroenterologist to clarify.  If you requested that your care partner not be given the details of your procedure findings, then the procedure report has been included in a sealed envelope for you to review at your convenience later.  YOU SHOULD EXPECT: Some feelings of bloating in the abdomen. Passage of more gas than usual.  Walking can help get rid of the air that was put into your GI tract during the procedure and reduce the bloating. If you had a lower endoscopy (such as a colonoscopy or flexible sigmoidoscopy) you may notice spotting of blood in your stool or on the toilet paper. If you underwent a bowel prep for your procedure, you may not have a normal bowel movement for a few days.  Please Note:  You might notice some irritation and congestion in your nose or some drainage.  This is from the oxygen used during your procedure.  There is no need for concern and it should clear up in a day or so.  SYMPTOMS TO REPORT IMMEDIATELY:     Following upper endoscopy (EGD)  Vomiting of blood or coffee ground material  New chest pain or pain under the shoulder blades  Painful or persistently difficult swallowing  New shortness of breath  Fever of 100F or higher  Black, tarry-looking stools  For urgent or emergent issues, a gastroenterologist can be reached at any hour by calling [phone].   DIET: Please follow the esophageal dilatation diet the rest of today.  Handout was given to you.  Drink plenty of fluids but you should avoid alcoholic beverages for 24 hours.  ACTIVITY:  You should plan to take it easy for the rest of today and you should NOT DRIVE or use heavy machinery until tomorrow (because of the sedation  medicines used during the test).    FOLLOW UP: Our staff will call the number listed on your records 48-72 hours following your procedure to check on you and address any questions or concerns that you may have regarding the information given to you following your procedure. If we do not reach you, we will leave a message.  We will attempt to reach you two times.  During this call, we will ask if you have developed any symptoms of COVID 19. If you develop any symptoms (ie: fever, flu-like symptoms, shortness of breath, cough etc.) before then, please call [phone].  If you test positive for Covid 19 in the 2 weeks post procedure, please call and report this information to [place].    If any biopsies were taken you will be contacted by phone or by letter within the next 1-3 weeks.  Please call us at [phone] if you have not heard about the biopsies in 3 weeks.    SIGNATURES/CONFIDENTIALITY: You and/or your care partner have signed paperwork which will be entered into your electronic medical record.  These signatures attest to the fact that that the information above on your After Visit Summary has been reviewed and is understood.  Full responsibility of the confidentiality of this discharge information lies with you and/or your care-partner.    Handouts were given to your care partner on GERD, Hiatal Hernia, Esophagitis, Esophageal stricture and the Dilatation diet to follow  the rest of the day. Increase your PANTOPRAZOLE 40 mg to twice daily 20 - 30 minutes before meals was sent to your pharmacy. You may resume your other  current medications today. Await biopsy results. Follow up in the office next available appointment.  The office will call you with this appointment. Please call if any questions or concerns.

## 2018-09-21 ENCOUNTER — Other Ambulatory Visit: Payer: Self-pay

## 2018-09-21 ENCOUNTER — Telehealth: Payer: Self-pay

## 2018-09-21 DIAGNOSIS — Z9889 Other specified postprocedural states: Secondary | ICD-10-CM

## 2018-09-21 DIAGNOSIS — Z20828 Contact with and (suspected) exposure to other viral communicable diseases: Secondary | ICD-10-CM | POA: Diagnosis not present

## 2018-09-21 MED ORDER — SUCRALFATE 1 GM/10ML PO SUSP: 1.0000 g | Freq: Four times a day (QID) | ORAL | 1 refills | Status: DC

## 2018-09-21 NOTE — Telephone Encounter (Signed)
  Follow up Call-  Call back number 09/19/2018  Post procedure Call Back phone  # [phone]  Permission to leave phone message Yes  Some recent data might be hidden     Patient questions:  Do you have a fever, pain , or abdominal swelling? Yes.   Pain Score  4 *  Have you tolerated food without any problems? Yes.    Have you been able to return to your normal activities? No.  Do you have any questions about your discharge instructions: Diet   No. Medications  No. Follow up visit  No.  Do you have questions or concerns about your Care? Yes.    Actions: * If pain score is 4 or above: Physician/ provider Notified : Harl Bowie, MD.  Patient says she has a headache, sore throat, and diarrhea.  Dr. Silverio Decamp prescribed Carafate 1 gram, 4 x daily as needed, and tylenol as needed.  Also, asked that she call her primary care office to get a COVID 19 test per the symptoms she is having.

## 2018-09-21 NOTE — Telephone Encounter (Signed)
No answer, left message to call back later today, B.Jaekwon Mcclune RN. 

## 2018-09-23 ENCOUNTER — Telehealth: Payer: Self-pay | Admitting: Internal Medicine

## 2018-09-23 DIAGNOSIS — Z20828 Contact with and (suspected) exposure to other viral communicable diseases: Secondary | ICD-10-CM | POA: Diagnosis not present

## 2018-09-23 MED ORDER — SUCRALFATE 1 G PO TABS: 1.0000 g | ORAL_TABLET | Freq: Four times a day (QID) | ORAL | 0 refills | Status: DC

## 2018-09-23 MED ORDER — AMBULATORY NON FORMULARY MEDICATION: 0 refills | Status: DC

## 2018-09-23 NOTE — Telephone Encounter (Signed)
Pt requested a call back to discuss results of EGD.

## 2018-09-23 NOTE — Telephone Encounter (Signed)
See Endoscopy pathology report from 09/19/18

## 2018-09-23 NOTE — Addendum Note (Signed)
Addended by: Larina Bras on: 09/23/2018 10:40 AM   Modules accepted: Orders

## 2018-09-23 NOTE — Telephone Encounter (Signed)
Dottie looks like you left a message for the pt to return your call.

## 2018-09-27 DIAGNOSIS — J018 Other acute sinusitis: Secondary | ICD-10-CM | POA: Diagnosis not present

## 2018-10-06 DIAGNOSIS — J301 Allergic rhinitis due to pollen: Secondary | ICD-10-CM | POA: Diagnosis not present

## 2018-10-06 DIAGNOSIS — J3089 Other allergic rhinitis: Secondary | ICD-10-CM | POA: Diagnosis not present

## 2018-10-06 DIAGNOSIS — H1045 Other chronic allergic conjunctivitis: Secondary | ICD-10-CM | POA: Diagnosis not present

## 2018-10-06 DIAGNOSIS — K200 Eosinophilic esophagitis: Secondary | ICD-10-CM | POA: Diagnosis not present

## 2018-10-10 DIAGNOSIS — D0462 Carcinoma in situ of skin of left upper limb, including shoulder: Secondary | ICD-10-CM | POA: Diagnosis not present

## 2018-10-10 HISTORY — PX: SKIN CANCER EXCISION: SHX779

## 2018-10-19 ENCOUNTER — Encounter: Payer: Self-pay | Admitting: *Deleted

## 2018-10-24 ENCOUNTER — Other Ambulatory Visit: Payer: Medicare Other

## 2018-10-24 ENCOUNTER — Other Ambulatory Visit: Payer: Self-pay

## 2018-10-24 ENCOUNTER — Encounter: Payer: Self-pay | Admitting: Internal Medicine

## 2018-10-24 ENCOUNTER — Ambulatory Visit (INDEPENDENT_AMBULATORY_CARE_PROVIDER_SITE_OTHER): Payer: Medicare Other | Admitting: Internal Medicine

## 2018-10-24 VITALS — BP 122/74 | HR 77 | Temp 98.1°F | Ht 62.5 in | Wt 133.2 lb

## 2018-10-24 DIAGNOSIS — Z20822 Contact with and (suspected) exposure to covid-19: Secondary | ICD-10-CM

## 2018-10-24 DIAGNOSIS — K580 Irritable bowel syndrome with diarrhea: Secondary | ICD-10-CM

## 2018-10-24 DIAGNOSIS — K200 Eosinophilic esophagitis: Secondary | ICD-10-CM | POA: Diagnosis not present

## 2018-10-24 DIAGNOSIS — K2100 Gastro-esophageal reflux disease with esophagitis, without bleeding: Secondary | ICD-10-CM

## 2018-10-24 DIAGNOSIS — B349 Viral infection, unspecified: Secondary | ICD-10-CM

## 2018-10-24 LAB — SARS-COV-2 IGG: SARS-COV-2 IgG: 0.03

## 2018-10-24 MED ORDER — RIFAXIMIN 550 MG PO TABS: 550.0000 mg | ORAL_TABLET | Freq: Three times a day (TID) | ORAL | 0 refills | Status: DC

## 2018-10-24 MED ORDER — FAMOTIDINE 20 MG PO TABS: 20.0000 mg | ORAL_TABLET | Freq: Two times a day (BID) | ORAL | 3 refills | Status: DC

## 2018-10-24 NOTE — Patient Instructions (Signed)
Change from pantoprazole to famotidine.  We have sent the following medications to your pharmacy for you to pick up at your convenience: Famotidine  Xifaxan 550 mg three times daily x 14 days  Should you need to take carafate, you may make into a slurry as we discussed at your visit today.  Your provider has requested that you go to the basement level for lab work before leaving today. Press "B" on the elevator. The lab is located at the first door on the left as you exit the elevator.  If you are age 71 or older, your body mass index should be between 23-30. Your Body mass index is 23.98 kg/m. If this is out of the aforementioned range listed, please consider follow up with your Primary Care Provider.  If you are age 60 or younger, your body mass index should be between 19-25. Your Body mass index is 23.98 kg/m. If this is out of the aformentioned range listed, please consider follow up with your Primary Care Provider.

## 2018-10-24 NOTE — Progress Notes (Signed)
Subjective:    Patient ID: Mary Rubio, female    DOB: Nov 07, 1945, 73 y.o.   MRN: [id number]  HPI Mary Rubio is a 73 year old female with a history of GERD, esophageal stricture, probable EOE, longstanding IBS, hyperlipidemia, hypothyroidism who is seen for follow-up.  She is here alone today.  She was last seen on 09/19/2018 for upper endoscopy.  EGD on 09/19/2018 performed for epigastric pain, dysphagia and GERD with history of food impaction.  Revealed LA grade a esophagitis.  Mucosal changes of furrowing and white plaques.  Biopsies performed.  2 cm hiatal hernia.  Stenosis at 38 cm dilated to 15.5 mm with balloon.  Normal stomach which was biopsied.  Normal examined duodenum.  Pathology from the esophagus showed squamous mucosa with focal intraepithelial eosinophils focally up to 13 per high-power field.  Negative for fungus.  Stomach biopsies mild chronic inflammation negative for H. pylori.  She reports that her swallowing is better and the discomfort that she was feeling substernally and near the xiphoid has improved dramatically.  This was better almost immediately upon waking from sleep.  Her reflux is also under better control though she feels famotidine works better than pantoprazole.  She is currently using pantoprazole.  She continues to have occasional loose stool and occasional epigastric pain or right upper quadrant discomfort with heavier meals such as hamburger.  She is status post cholecystectomy.  She tried Carafate intermittently which helps but she could not afford the Carafate liquid.  Of note after her endoscopy, 2 days later she developed a viral type illness which involved sinus pain and pressure, extreme fatigue, diarrhea, nausea, cough, mild confusion and food aversion.  She was eventually treated for sinusitis with azithromycin.  She did have a COVID-19 swab which was negative but she is still suspicious she could have had COVID.  It took her 2 weeks before  she was feeling back to her baseline.   Review of Systems As per HPI, otherwise negative  Current Medications, Allergies, Past Medical History, Past Surgical History, Family History and Social History were reviewed in [employer] record.     Objective:   Physical Exam BP 122/74   Pulse 77   Temp 98.1 F (36.7 C)   Ht 5' 2.5" (1.588 m)   Wt 133 lb 4 oz (60.4 kg)   BMI 23.98 kg/m  Gen: awake, alert, NAD HEENT: anicteric, op clear CV: RRR, no mrg Pulm: CTA b/l Abd: soft, NT/ND, +BS throughout Ext: no c/c/e Neuro: nonfocal      Assessment & Plan:  73 year old female with a history of GERD, esophageal stricture, probable EOE, longstanding IBS, hyperlipidemia, hypothyroidism who is seen for follow-up.  1.  GERD/EOE/esophageal dysphagia --symptoms have improved after esophageal dilation 1 month ago.  She feels that famotidine works better than pantoprazole so we will switch her back to famotidine every 12 hours.  I asked that she notify me if when making the switch she realizes the pantoprazole actually was better.  Repeat dilation when needed.  We discussed EoE and given her improvement I am not going to start budesonide for now.  However if she has recurrent issues with heartburn, dysphagia or chest discomfort then I would recommend budesonide slurry --Discontinue pantoprazole --Begin famotidine 20 mg every 12 hours  --She can use sucralfate tablets and make them into a slurry, I reviewed how to do this, and use this every 8 hours as needed  2.  IBS with loose stool --rifaximin 550 mg 3  times daily x14 days  3.  CRC screening normal colonoscopy performed by Dr. Henrene Pastor on 06/19/2014; repeat would be recommended May 2026  4.  Recent viral illness --her recent viral illness could have been consistent with COVID-19 but her swab was negative.  I will check an antibody just to see if positive given the false negative potential with COVID-19 testing.  Viral symptoms  have now resolved  25 minutes spent with the patient today. Greater than 50% was spent in counseling and coordination of care with the patient

## 2018-11-09 ENCOUNTER — Telehealth: Payer: Self-pay

## 2018-11-09 NOTE — Telephone Encounter (Signed)
Patient called in wanting to know why she can't get a 90 day supply rater than the 30 day supply. Instruction says to take with breakfast as directed and it should be to take on an empty stomach    Please call and advise

## 2018-11-10 MED ORDER — SYNTHROID 88 MCG PO TABS: 88.0000 ug | ORAL_TABLET | Freq: Every day | ORAL | 3 refills | Status: DC

## 2018-11-10 NOTE — Telephone Encounter (Signed)
We have been sending a 90 day with refills.  SYNTHROID 88 MCG tablet 90 tablet 3 06/15/2018    Sig: TAKE ONE TABLET BY MOUTH DAILY WITH BREAKFAST AS DIRECTED   Sent to pharmacy as: SYNTHROID 88 MCG tablet   E-Prescribing Status: Receipt confirmed by pharmacy (06/15/2018 10:36 AM EDT)    I spoke to patient and let her know we have been sending a 90 day but the sig was wrong.  Patient request sending new RX to [facility] on [address], which I did and also put the correct sig.

## 2018-11-18 DIAGNOSIS — T781XXD Other adverse food reactions, not elsewhere classified, subsequent encounter: Secondary | ICD-10-CM | POA: Diagnosis not present

## 2018-11-18 DIAGNOSIS — R21 Rash and other nonspecific skin eruption: Secondary | ICD-10-CM | POA: Diagnosis not present

## 2018-11-18 DIAGNOSIS — K200 Eosinophilic esophagitis: Secondary | ICD-10-CM | POA: Diagnosis not present

## 2018-11-18 DIAGNOSIS — J3089 Other allergic rhinitis: Secondary | ICD-10-CM | POA: Diagnosis not present

## 2018-11-21 DIAGNOSIS — D225 Melanocytic nevi of trunk: Secondary | ICD-10-CM | POA: Diagnosis not present

## 2018-11-21 DIAGNOSIS — D2271 Melanocytic nevi of right lower limb, including hip: Secondary | ICD-10-CM | POA: Diagnosis not present

## 2018-11-21 DIAGNOSIS — L570 Actinic keratosis: Secondary | ICD-10-CM | POA: Diagnosis not present

## 2018-11-21 DIAGNOSIS — L218 Other seborrheic dermatitis: Secondary | ICD-10-CM | POA: Diagnosis not present

## 2018-11-21 DIAGNOSIS — D2261 Melanocytic nevi of right upper limb, including shoulder: Secondary | ICD-10-CM | POA: Diagnosis not present

## 2018-11-21 DIAGNOSIS — D1801 Hemangioma of skin and subcutaneous tissue: Secondary | ICD-10-CM | POA: Diagnosis not present

## 2018-11-21 DIAGNOSIS — Z85828 Personal history of other malignant neoplasm of skin: Secondary | ICD-10-CM | POA: Diagnosis not present

## 2018-11-21 DIAGNOSIS — D2272 Melanocytic nevi of left lower limb, including hip: Secondary | ICD-10-CM | POA: Diagnosis not present

## 2018-11-29 DIAGNOSIS — Z119 Encounter for screening for infectious and parasitic diseases, unspecified: Secondary | ICD-10-CM | POA: Diagnosis not present

## 2018-11-29 DIAGNOSIS — K121 Other forms of stomatitis: Secondary | ICD-10-CM | POA: Diagnosis not present

## 2019-04-10 ENCOUNTER — Other Ambulatory Visit: Payer: Self-pay | Admitting: Internal Medicine

## 2019-06-04 NOTE — Progress Notes (Signed)
Cardiology Consult Note    Date:  06/05/2019   ID:  Mary Rubio, DOB 16-Dec-1945, MRN [id number]  PCP:  Bernerd Limbo, MD  Cardiologist:  Fransico Him, MD   Chief Complaint  Patient presents with  . New Patient (Initial Visit)    Palpitations, DOE, fm hx of CAD    History of Present Illness:  Mary Rubio is a 74 y.o. female who is being seen today for the evaluation of family hx of premature CAD and palpitations at the request of Bernerd Limbo, MD.  This is a 74yo female with a hx of asthma ,depression and anxiety, HLD, GERD with esophageal stricture, and fm hx of premature CAD in her sister and also has been having palpitations and is now referred for risk stratification.    She tells me that she has had palpitations for years but recently noticed that when she gets the palpitations her BP will go very high and then her Bp cuff cannot register a heart rate.  She recently had an episode where her heart started to race and she got very dizzy and was on the phone with her daughter but could not really hear what she was saying.  She was worried she was having a stroke and went to bed and laid down and about 1 hour later her sx resolved.  She occasionally has chest tightness with the palpitations.  She also has noticed DOE with certain activities like taking a shower.  Her sister was dx with CAD at 33 and her father's side of the family is significant for CVAs and CAD.  Her Dad had afib.     Past Medical History:  Diagnosis Date  . Allergy   . Anxiety   . Asthma   . Colon polyps   . Depression   . Diverticulosis   . Environmental allergies    Weekly allergy shots  . Eosinophilic esophagitis   . Esophageal stricture   . Esophagitis   . GERD (gastroesophageal reflux disease)   . Hiatal hernia   . Hyperlipidemia   . Hypothyroidism   . IBS (irritable bowel syndrome)   . Osteopenia   . Osteoporosis   . Skin cancer    Left arm Squamous cell 10/10/2018    Past  Surgical History:  Procedure Laterality Date  . ABDOMINAL HYSTERECTOMY    . BARTHOLIN GLAND CYST EXCISION    . BUNIONECTOMY Right   . CHOLECYSTECTOMY    . COLONOSCOPY  2016  . ESOPHAGOGASTRODUODENOSCOPY N/A 10/21/2012   Procedure: ESOPHAGOGASTRODUODENOSCOPY (EGD);  Surgeon: Beryle Beams, MD;  Location: [facility];  Service: Endoscopy;  Laterality: N/A;  . ESOPHAGOGASTRODUODENOSCOPY N/A 11/02/2017   Procedure: ESOPHAGOGASTRODUODENOSCOPY (EGD);  Surgeon: Jerene Bears, MD;  Location: [facility];  Service: Gastroenterology;  Laterality: N/A;  . EXPLORATORY LAPAROTOMY     endometriosis  . facial cyst     excision  . NASAL SEPTUM SURGERY    . SKIN CANCER EXCISION Left 10/10/2018  . thumb surgery Left    for cyst    Current Medications: Current Meds  Medication Sig  . albuterol (PROVENTIL HFA;VENTOLIN HFA) 108 (90 Base) MCG/ACT inhaler Inhale 2 puffs into the lungs every 4 (four) hours as needed.   Marland Kitchen azelastine (ASTELIN) 0.1 % nasal spray   . azelastine (OPTIVAR) 0.05 % ophthalmic solution Place 1 drop into both eyes daily.   . cholecalciferol (VITAMIN D) 1000 units tablet Take 2,000 Units by mouth daily.  Marland Kitchen denosumab (PROLIA) 60 MG/ML SOSY injection  Inject 60 mg into the skin every 6 (six) months.  . EPINEPHrine (EPIPEN 2-PAK) 0.3 mg/0.3 mL IJ SOAJ injection Inject 0.3 mg into the muscle once.  . famotidine (PEPCID) 20 MG tablet TAKE 1 TABLET BY MOUTH EVERY 12 HOURS  . SYNTHROID 88 MCG tablet Take 1 tablet (88 mcg total) by mouth daily before breakfast.    Allergies:   Augmentin [amoxicillin-pot clavulanate], Crestor [rosuvastatin], Demerol [meperidine], Latex, Meperidine hcl, Morphine and related, Pentazocine lactate, Simvastatin, Talwin [pentazocine], and Codeine   Social History   Socioeconomic History  . Marital status: Single    Spouse name: Not on file  . Number of children: 1  . Years of education: college  . Highest education level: Not on file  Occupational  History  . Occupation: caregiver    Employer: NOT EMPLOYED  Tobacco Use  . Smoking status: Never Smoker  . Smokeless tobacco: Never Used  Substance and Sexual Activity  . Alcohol use: Not Currently    Alcohol/week: 0.0 standard drinks    Comment: occ beer  . Drug use: No  . Sexual activity: Never  Other Topics Concern  . Not on file  Social History Narrative  . Not on file   Social Determinants of Health   Financial Resource Strain:   . Difficulty of Paying Living Expenses:   Food Insecurity:   . Worried About Charity fundraiser in the Last Year:   . Arboriculturist in the Last Year:   Transportation Needs:   . Film/video editor (Medical):   Marland Kitchen Lack of Transportation (Non-Medical):   Physical Activity:   . Days of Exercise per Week:   . Minutes of Exercise per Session:   Stress:   . Feeling of Stress :   Social Connections:   . Frequency of Communication with Friends and Family:   . Frequency of Social Gatherings with Friends and Family:   . Attends Religious Services:   . Active Member of Clubs or Organizations:   . Attends Archivist Meetings:   Marland Kitchen Marital Status:      Family History:  The patient's family history includes Alzheimer's disease in her father; Arrhythmia in her father; CAD in her sister; Dementia in her father; Heart disease in her father, maternal grandfather, mother, paternal grandmother, and sister; Hypertension in her mother; Lung cancer in her mother; Skin cancer in her father; Thyroid disease in her daughter, mother, and sister.   ROS:   Please see the history of present illness.    ROS All other systems reviewed and are negative.  No flowsheet data found.     PHYSICAL EXAM:   VS:  BP 136/78   Pulse 78   Ht 5' 2.5" (1.588 m)   Wt 135 lb 6.4 oz (61.4 kg)   SpO2 98%   BMI 24.37 kg/m    GEN: Well nourished, well developed, in no acute distress  HEENT: normal  Neck: no JVD, carotid bruits, or masses Cardiac: RRR; no  murmurs, rubs, or gallops,no edema.  Intact distal pulses bilaterally.  Respiratory:  clear to auscultation bilaterally, normal work of breathing GI: soft, nontender, nondistended, + BS MS: no deformity or atrophy  Skin: warm and dry, no rash Neuro:  Alert and Oriented x 3, Strength and sensation are intact Psych: euthymic mood, full affect  Wt Readings from Last 3 Encounters:  06/05/19 135 lb 6.4 oz (61.4 kg)  10/24/18 133 lb 4 oz (60.4 kg)  09/19/18 134 lb (60.8  kg)      Studies/Labs Reviewed:   EKG:  EKG is ordered today.  The ekg ordered today demonstrates NSR with nonspecific ST abnormality  Recent Labs: No results found for requested labs within last 8760 hours.   Lipid Panel    Component Value Date/Time   CHOL 202 (H) 11/28/2014 1227   TRIG 96 11/28/2014 1227   HDL 62 11/28/2014 1227   CHOLHDL 3.3 11/28/2014 1227   VLDL 19 11/28/2014 1227   LDLCALC 121 11/28/2014 1227    Additional studies/ records that were reviewed today include:  OV notes from PCP    ASSESSMENT:    1. Family history of premature CAD   2. Pure hypercholesterolemia   3. Palpitations   4. DOE (dyspnea on exertion)      PLAN:  In order of problems listed above:  1.  Family hx of Premature CAD -she had had some chest tightness when she has the palpitations as well as DOE -EKG shows nonspecific ST abnormality -recommend coronary CTA to assess for CAD  2.  HLD -LDL followed by PCP -diet controlled  3.  Palpitations -her father had a hx of afib -I will get an event monitor to assess for arrhythmias  4.  DOE -this occurs with ADLs -I will get a 2D echo to assess LVF    Medication Adjustments/Labs and Tests Ordered: Current medicines are reviewed at length with the patient today.  Concerns regarding medicines are outlined above.  Medication changes, Labs and Tests ordered today are listed in the Patient Instructions below.  Patient Instructions  Medication Instructions:  Your  physician recommends that you continue on your current medications as directed. Please refer to the Current Medication list given to you today.  *If you need a refill on your cardiac medications before your next appointment, please call your pharmacy*  Testing/Procedures: Your physician has requested that you have an echocardiogram. Echocardiography is a painless test that uses sound waves to create images of your heart. It provides your doctor with information about the size and shape of your heart and how well your heart's chambers and valves are working. This procedure takes approximately one hour. There are no restrictions for this procedure.  Your physician has recommended that you wear an event monitor. Event monitors are medical devices that record the heart's electrical activity. Doctors most often [place] these monitors to diagnose arrhythmias. Arrhythmias are problems with the speed or rhythm of the heartbeat. The monitor is a small, portable device. You can wear one while you do your normal daily activities. This is usually used to diagnose what is causing palpitations/syncope (passing out).   Follow-Up: At [facility], you and your health needs are our priority.  As part of our continuing mission to provide you with exceptional heart care, we have created designated Provider Care Teams.  These Care Teams include your primary Cardiologist (physician) and Advanced Practice Providers (APPs -  Physician Assistants and Nurse Practitioners) who all work together to provide you with the care you need, when you need it.  Follow up with Dr. Radford Pax as needed based on results of testing.    Other Instructions Your cardiac CT will be scheduled at:  [facility] [address] [place], [place] [address] [phone]  Please arrive at the [facility] main entrance of [facility] 30 minutes prior to test start time. Proceed to the [facility] Radiology Department (first floor)  to check-in and test prep.  Please follow  these instructions carefully (unless otherwise directed):  On the Night Before the Test: . Be sure to Drink plenty of water. . Do not consume any caffeinated/decaffeinated beverages or chocolate 12 hours prior to your test. . Do not take any antihistamines 12 hours prior to your test. . If you take Metformin do not take 24 hours prior to test.  On the Day of the Test: . Drink plenty of water. Do not drink any water within one hour of the test. . Do not eat any food 4 hours prior to the test. . You may take your regular medications prior to the test.  . Take metoprolol (Lopressor) two hours prior to test. . FEMALES- please wear underwire-free bra if available       After the Test: . Drink plenty of water. . After receiving IV contrast, you may experience a mild flushed feeling. This is normal. . On occasion, you may experience a mild rash up to 24 hours after the test. This is not dangerous. If this occurs, you can take Benadryl 25 mg and increase your fluid intake. . If you experience trouble breathing, this can be serious. If it is severe call 911 IMMEDIATELY. If it is mild, please call our office. . If you take any of these medications: Glipizide/Metformin, Avandament, Glucavance, please do not take 48 hours after completing test unless otherwise instructed.   Once we have confirmed authorization from your insurance company, we will call you to set up a date and time for your test.   For non-scheduling related questions, please contact the cardiac imaging nurse navigator should you have any questions/concerns: Marchia Bond, Cardiac Imaging Nurse Navigator Burley Saver, Interim Cardiac Imaging Nurse [place] [facility] Direct Office Dial: [phone]   For scheduling needs, including cancellations and rescheduling, please call [phone].        Signed, Fransico Him, MD  06/05/2019 9:07 AM    [facility] [place], [place], [place]  [address] Phone: [phone]; Fax: [phone]

## 2019-06-05 ENCOUNTER — Ambulatory Visit (INDEPENDENT_AMBULATORY_CARE_PROVIDER_SITE_OTHER): Payer: Medicare Other | Admitting: Cardiology

## 2019-06-05 ENCOUNTER — Other Ambulatory Visit: Payer: Self-pay

## 2019-06-05 ENCOUNTER — Encounter: Payer: Self-pay | Admitting: Cardiology

## 2019-06-05 VITALS — BP 136/78 | HR 78 | Ht 62.5 in | Wt 135.4 lb

## 2019-06-05 DIAGNOSIS — Z8249 Family history of ischemic heart disease and other diseases of the circulatory system: Secondary | ICD-10-CM

## 2019-06-05 DIAGNOSIS — R0600 Dyspnea, unspecified: Secondary | ICD-10-CM | POA: Diagnosis not present

## 2019-06-05 DIAGNOSIS — R072 Precordial pain: Secondary | ICD-10-CM

## 2019-06-05 DIAGNOSIS — R002 Palpitations: Secondary | ICD-10-CM

## 2019-06-05 DIAGNOSIS — R0609 Other forms of dyspnea: Secondary | ICD-10-CM

## 2019-06-05 DIAGNOSIS — E7800 Pure hypercholesterolemia, unspecified: Secondary | ICD-10-CM

## 2019-06-05 DIAGNOSIS — R931 Abnormal findings on diagnostic imaging of heart and coronary circulation: Secondary | ICD-10-CM

## 2019-06-05 MED ORDER — METOPROLOL TARTRATE 100 MG PO TABS
100.0000 mg | ORAL_TABLET | Freq: Once | ORAL | 0 refills | Status: DC
Start: 2019-06-05 — End: 2020-11-20

## 2019-06-05 NOTE — Patient Instructions (Addendum)
Medication Instructions:  Your physician recommends that you continue on your current medications as directed. Please refer to the Current Medication list given to you today.  *If you need a refill on your cardiac medications before your next appointment, please call your pharmacy*  Testing/Procedures: Your physician has requested that you have an echocardiogram. Echocardiography is a painless test that uses sound waves to create images of your heart. It provides your doctor with information about the size and shape of your heart and how well your heart's chambers and valves are working. This procedure takes approximately one hour. There are no restrictions for this procedure.  Your physician has recommended that you wear an event monitor. Event monitors are medical devices that record the heart's electrical activity. Doctors most often [place] these monitors to diagnose arrhythmias. Arrhythmias are problems with the speed or rhythm of the heartbeat. The monitor is a small, portable device. You can wear one while you do your normal daily activities. This is usually used to diagnose what is causing palpitations/syncope (passing out).   Follow-Up: At [facility], you and your health needs are our priority.  As part of our continuing mission to provide you with exceptional heart care, we have created designated Provider Care Teams.  These Care Teams include your primary Cardiologist (physician) and Advanced Practice Providers (APPs -  Physician Assistants and Nurse Practitioners) who all work together to provide you with the care you need, when you need it.  Follow up with Dr. Radford Pax as needed based on results of testing.    Other Instructions Your cardiac CT will be scheduled at:  [facility] [address] [place], [place] [address] [phone]  Please arrive at the [facility] main entrance of [facility] 30 minutes prior to test start time. Proceed to the [facility]  Radiology Department (first floor) to check-in and test prep.  Please follow these instructions carefully (unless otherwise directed):  On the Night Before the Test: . Be sure to Drink plenty of water. . Do not consume any caffeinated/decaffeinated beverages or chocolate 12 hours prior to your test. . Do not take any antihistamines 12 hours prior to your test. . If you take Metformin do not take 24 hours prior to test.  On the Day of the Test: . Drink plenty of water. Do not drink any water within one hour of the test. . Do not eat any food 4 hours prior to the test. . You may take your regular medications prior to the test.  . Take metoprolol (Lopressor) two hours prior to test. . FEMALES- please wear underwire-free bra if available       After the Test: . Drink plenty of water. . After receiving IV contrast, you may experience a mild flushed feeling. This is normal. . On occasion, you may experience a mild rash up to 24 hours after the test. This is not dangerous. If this occurs, you can take Benadryl 25 mg and increase your fluid intake. . If you experience trouble breathing, this can be serious. If it is severe call 911 IMMEDIATELY. If it is mild, please call our office. . If you take any of these medications: Glipizide/Metformin, Avandament, Glucavance, please do not take 48 hours after completing test unless otherwise instructed.   Once we have confirmed authorization from your insurance company, we will call you to set up a date and time for your test.   For non-scheduling related questions, please contact the cardiac imaging nurse  navigator should you have any questions/concerns: Marchia Bond, Cardiac Imaging Nurse Navigator Burley Saver, Interim Cardiac Imaging Nurse Navigator [facility] Direct Office Dial: [phone]   For scheduling needs, including cancellations and rescheduling, please call [phone].

## 2019-06-12 ENCOUNTER — Telehealth: Payer: Self-pay | Admitting: Cardiology

## 2019-06-12 NOTE — Telephone Encounter (Signed)
Patient is requesting to speak with a nurse regarding whether or not echocardiogram scheduled for 06/26/19 will interfere with heart monitor. Please call to discuss.

## 2019-06-12 NOTE — Telephone Encounter (Signed)
Left message for patient

## 2019-06-14 ENCOUNTER — Ambulatory Visit: Payer: Medicare Other | Admitting: Internal Medicine

## 2019-06-17 ENCOUNTER — Ambulatory Visit (INDEPENDENT_AMBULATORY_CARE_PROVIDER_SITE_OTHER): Payer: Medicare Other

## 2019-06-17 DIAGNOSIS — R072 Precordial pain: Secondary | ICD-10-CM | POA: Diagnosis not present

## 2019-06-17 DIAGNOSIS — R002 Palpitations: Secondary | ICD-10-CM

## 2019-06-26 ENCOUNTER — Other Ambulatory Visit: Payer: Self-pay

## 2019-06-26 ENCOUNTER — Ambulatory Visit (HOSPITAL_COMMUNITY): Payer: Medicare Other | Attending: Cardiovascular Disease

## 2019-06-26 DIAGNOSIS — R072 Precordial pain: Secondary | ICD-10-CM | POA: Diagnosis not present

## 2019-06-26 DIAGNOSIS — R0600 Dyspnea, unspecified: Secondary | ICD-10-CM | POA: Diagnosis not present

## 2019-06-26 DIAGNOSIS — R002 Palpitations: Secondary | ICD-10-CM | POA: Insufficient documentation

## 2019-06-30 ENCOUNTER — Telehealth: Payer: Self-pay | Admitting: Emergency Medicine

## 2019-06-30 NOTE — Telephone Encounter (Signed)
Patient is having a reaction to he electrodes from her monitor - patient states her skin is raw and states there are blisters where the electrodes have been, and skin is puffy. She states she can't find another spot to put the electrodes under her collar bone. Patient states she has only been wearing it for 2 wks, but states she is supposed to wear it for 30 days. Patient is wanting to know if there has been enough data collected during the 2 wks to stop wearing it.

## 2019-06-30 NOTE — Telephone Encounter (Signed)
Returned call to patient and she already has been using the sensitive skin electrodes. I told her to give her skin a few days to heal. Then place new electrodes slightly over and try using mylanta or maalox under them. If this does not work she has already worn the monitor for 14 days and will mail it back

## 2019-06-30 NOTE — Telephone Encounter (Signed)
Spoke with pt who states she has removed monitor patches due to skin becoming raw, itchy and swollen.  Pt reports she does have a latex allergy.  Pt advised to not reapply patches.  Will forward information to Dr Radford Pax and monitor techs for review and further advisement.  Pt reports no bleeding or difficulty breathing.  Pt verbalizes understanding and agrees with current plan.

## 2019-07-13 ENCOUNTER — Other Ambulatory Visit: Payer: Medicare Other

## 2019-07-20 ENCOUNTER — Other Ambulatory Visit (HOSPITAL_COMMUNITY): Payer: Medicare Other

## 2019-09-27 ENCOUNTER — Emergency Department (HOSPITAL_COMMUNITY)
Admission: EM | Admit: 2019-09-27 | Discharge: 2019-09-28 | Disposition: A | Discharge status: Home / Self Care | Payer: Medicare Other | Attending: Emergency Medicine | Admitting: Emergency Medicine

## 2019-09-27 DIAGNOSIS — R1030 Lower abdominal pain, unspecified: Secondary | ICD-10-CM | POA: Diagnosis not present

## 2019-09-27 DIAGNOSIS — K6289 Other specified diseases of anus and rectum: Secondary | ICD-10-CM | POA: Insufficient documentation

## 2019-09-27 DIAGNOSIS — R1110 Vomiting, unspecified: Secondary | ICD-10-CM | POA: Diagnosis not present

## 2019-09-27 DIAGNOSIS — Z5321 Procedure and treatment not carried out due to patient leaving prior to being seen by health care provider: Secondary | ICD-10-CM | POA: Diagnosis not present

## 2019-09-27 DIAGNOSIS — R197 Diarrhea, unspecified: Secondary | ICD-10-CM | POA: Insufficient documentation

## 2019-09-27 DIAGNOSIS — R61 Generalized hyperhidrosis: Secondary | ICD-10-CM | POA: Diagnosis not present

## 2019-09-27 LAB — CBC
HCT: 42 % (ref 36.0–46.0)
Hemoglobin: 13.9 g/dL (ref 12.0–15.0)
MCH: 30.7 pg (ref 26.0–34.0)
MCHC: 33.1 g/dL (ref 30.0–36.0)
MCV: 92.7 fL (ref 80.0–100.0)
Platelets: 218 10*3/uL (ref 150–400)
RBC: 4.53 MIL/uL (ref 3.87–5.11)
RDW: 11.9 % (ref 11.5–15.5)
WBC: 10.3 10*3/uL (ref 4.0–10.5)
nRBC: 0 % (ref 0.0–0.2)

## 2019-09-27 LAB — COMPREHENSIVE METABOLIC PANEL
ALT: 17 U/L (ref 0–44)
AST: 21 U/L (ref 15–41)
Albumin: 4.2 g/dL (ref 3.5–5.0)
Alkaline Phosphatase: 58 U/L (ref 38–126)
Anion gap: 13 (ref 5–15)
BUN: 13 mg/dL (ref 8–23)
CO2: 21 mmol/L — ABNORMAL LOW (ref 22–32)
Calcium: 9.1 mg/dL (ref 8.9–10.3)
Chloride: 105 mmol/L (ref 98–111)
Creatinine, Ser: 1.16 mg/dL — ABNORMAL HIGH (ref 0.44–1.00)
GFR calc Af Amer: 55 mL/min — ABNORMAL LOW (ref >60)
GFR calc non Af Amer: 47 mL/min — ABNORMAL LOW (ref >60)
Glucose, Bld: 142 mg/dL — ABNORMAL HIGH (ref 70–99)
Potassium: 3.6 mmol/L (ref 3.5–5.1)
Sodium: 139 mmol/L (ref 135–145)
Total Bilirubin: 0.1 mg/dL — ABNORMAL LOW (ref 0.3–1.2)
Total Protein: 7.1 g/dL (ref 6.5–8.1)

## 2019-09-27 LAB — TYPE AND SCREEN
ABO/RH(D): A POS
Antibody Screen: NEGATIVE

## 2019-09-27 LAB — LIPASE, BLOOD: Lipase: 30 U/L (ref 11–51)

## 2019-09-27 NOTE — ED Notes (Signed)
Pt has decided to leave. Pt states that she will be following up with her PCP in the morning.

## 2019-09-27 NOTE — ED Triage Notes (Addendum)
Pt had sudden onset of diarrhea and vomiting on 9/1 that resolved after a few hours. Rx for zofran given, which worked until today, at which time she developed lower abdominal and rectal pain. Vomiting came back today. Pt endorses constant diaphoresis. 50 mcg Fentanyl given by EMS. Pt went to the bathroom immediately after being triaged. Did have a bowel movements and reports passing large clots of blood.

## 2019-09-28 ENCOUNTER — Telehealth: Payer: Self-pay | Admitting: Internal Medicine

## 2019-09-28 ENCOUNTER — Telehealth: Payer: Self-pay

## 2019-09-28 NOTE — Telephone Encounter (Signed)
She can see Dr. Henrene Pastor tomorrow at 68 AM She will remain my patient, but he is seeing her on my behalf given urgency of visit Thanks JP JMP

## 2019-09-28 NOTE — Telephone Encounter (Signed)
Called patient to advise left voicemail. °

## 2019-09-28 NOTE — Telephone Encounter (Signed)
Dr. Chales Abrahams office called with an urgent referral for rectal bleeding. Patient was at ED yesterday.  Please advise on scheduling.  Thank you

## 2019-09-28 NOTE — Telephone Encounter (Signed)
PCP sending asap referral for rectal bleeding. There are no soon appts at all. First app appt is into October. Dr. Henrene Pastor has some appts tomorrow. Please advise.

## 2019-09-28 NOTE — Telephone Encounter (Signed)
Please see the note below. Pt will see Dr. Henrene Pastor tomorrow at 10:20am. Please notify pt of appt.

## 2019-09-29 ENCOUNTER — Encounter: Payer: Self-pay | Admitting: Internal Medicine

## 2019-09-29 ENCOUNTER — Ambulatory Visit (INDEPENDENT_AMBULATORY_CARE_PROVIDER_SITE_OTHER): Payer: Medicare Other | Admitting: Internal Medicine

## 2019-09-29 VITALS — BP 112/70 | HR 76 | Ht 62.5 in | Wt 133.0 lb

## 2019-09-29 DIAGNOSIS — K580 Irritable bowel syndrome with diarrhea: Secondary | ICD-10-CM | POA: Diagnosis not present

## 2019-09-29 DIAGNOSIS — K219 Gastro-esophageal reflux disease without esophagitis: Secondary | ICD-10-CM

## 2019-09-29 DIAGNOSIS — K200 Eosinophilic esophagitis: Secondary | ICD-10-CM | POA: Diagnosis not present

## 2019-09-29 DIAGNOSIS — K625 Hemorrhage of anus and rectum: Secondary | ICD-10-CM

## 2019-09-29 MED ORDER — PANTOPRAZOLE SODIUM 40 MG PO TBEC: 40.0000 mg | DELAYED_RELEASE_TABLET | Freq: Every day | ORAL | 3 refills | Status: DC

## 2019-09-29 NOTE — Patient Instructions (Signed)
We have sent the following medications to your pharmacy for you to pick up at your convenience:  Pantoprazole.  Please follow up with Dr. Hilarie Fredrickson on 12/21/2019 at 9:10am

## 2019-09-29 NOTE — Progress Notes (Signed)
HISTORY OF PRESENT ILLNESS:  Mary Rubio is a 74 y.o. female with past medical history as listed below who presents today as an urgent add-on to evaluate rectal bleeding.  She is well-known to this practice and has seen multiple gastroenterologists outside and within our practice, including myself.  Most recently, Dr. Hilarie Fredrickson.  GI problems include longstanding irritable bowel syndrome with diarrhea predominance and bloating, chronic functional complaints, GERD with associated esophageal eosinophilia and esophageal stricture previously requiring esophageal dilation. Her current history is that of the abrupt onset of nausea and vomiting with diarrhea at 2 AM on September 19, 2019.  Symptoms persisted for about 3 hours.  She was evaluated by her primary provider and found to be Covid negative.  She was told she had a viral illness.  She was treated with Zofran for nausea.  Her symptoms improved.  However she subsequently developed severe constipation.  This resulted in severe lower abdominal pain which brought her to the local emergency room.  She had blood work that was seen.  That blood work on September 27, 2019 included an unremarkable comprehensive metabolic panel and normal CBC with white blood cell count 10.3 and hemoglobin 13.9.  She subsequently took laxatives.  She then developed multiple episodes of diarrhea with abdominal cramping and then rectal bleeding associated with loose stools.  She was evaluated at the [facility] September 28, 2019.  I have reviewed the entire encounter.  The highlights include mild generalized abdominal tenderness on physical exam and light brown stool with blood on rectal examination.  Hemoglobin at that time was 14.2.  White blood cell count 9 5.  Contrast-enhanced CT scan of the abdomen and pelvis was unremarkable.  She was discharged home.  Abdominal pain resolved.  She has not had a bowel movement since.  Urgent evaluation in this office  requested.  Aside from these issues, she tells me that she has some problems with recurrent dysphagia.  She tells me that her reflux symptoms are poorly controlled on Pepcid.  She wonders about an alternative medication.  Her record states that she did not tolerate PPI in the past but it is unclear in what manner.  She is willing to try another agent.  She is also willing to undergo repeat esophageal dilation, if necessary.  Next he brings up her longstanding problems with abdominal bloating and chronic abdominal pain.  She states that she has had these problems for decades.  No significant change.  REVIEW OF SYSTEMS:  All non-GI ROS negative unless otherwise stated in the HPI except for sinus and allergy, arthritis, back pain, cough, night sweats, muscle pain, headaches, skin rash, sleeping problems  Past Medical History:  Diagnosis Date  . Allergy   . Anxiety   . Asthma   . Colon polyps   . Depression   . Diverticulosis   . Environmental allergies    Weekly allergy shots  . Eosinophilic esophagitis   . Esophageal stricture   . Esophagitis   . GERD (gastroesophageal reflux disease)   . Hiatal hernia   . Hyperlipidemia   . Hypothyroidism   . IBS (irritable bowel syndrome)   . Osteopenia   . Osteoporosis   . Skin cancer    Left arm Squamous cell 10/10/2018    Past Surgical History:  Procedure Laterality Date  . ABDOMINAL HYSTERECTOMY    . BARTHOLIN GLAND CYST EXCISION    . BUNIONECTOMY Right   . CHOLECYSTECTOMY    . COLONOSCOPY  2016  . ESOPHAGOGASTRODUODENOSCOPY N/A 10/21/2012   Procedure: ESOPHAGOGASTRODUODENOSCOPY (EGD);  Surgeon: Beryle Beams, MD;  Location: [facility];  Service: Endoscopy;  Laterality: N/A;  . ESOPHAGOGASTRODUODENOSCOPY N/A 11/02/2017   Procedure: ESOPHAGOGASTRODUODENOSCOPY (EGD);  Surgeon: Jerene Bears, MD;  Location: [facility] ENDOSCOPY;  Service: Gastroenterology;  Laterality: N/A;  . EXPLORATORY LAPAROTOMY     endometriosis  . facial cyst     excision   . NASAL SEPTUM SURGERY    . SKIN CANCER EXCISION Left 10/10/2018  . thumb surgery Left    for cyst    Social History Laurenashley FITZ GERALD  reports that she has never smoked. She has never used smokeless tobacco. She reports previous alcohol use. She reports that she does not use drugs.  family history includes Alzheimer's disease in her father; Arrhythmia in her father; CAD in her sister; Dementia in her father; Heart disease in her father, maternal grandfather, mother, paternal grandmother, and sister; Hypertension in her mother; Lung cancer in her mother; Skin cancer in her father; Thyroid disease in her daughter, mother, and sister.  Allergies  Allergen Reactions  . Augmentin [Amoxicillin-Pot Clavulanate] Other (See Comments)    Stomach pain   . Crestor [Rosuvastatin]     Myalgias  . Demerol [Meperidine]   . Latex Other (See Comments)    Canker sores in mouth (discovered by dentist)  . Meperidine Hcl Other (See Comments)    Does not remember   . Morphine And Related Itching    Has tolerated small doses of hydrocodone  . Pentazocine Lactate Nausea And Vomiting    Sick on stomach  . Simvastatin Other (See Comments)    Muscle aches   . Talwin [Pentazocine]   . Codeine Rash    Has tolerated small doses of hydrocodone       PHYSICAL EXAMINATION: Vital signs: BP 112/70   Pulse 76   Ht 5' 2.5" (1.588 m)   Wt 133 lb (60.3 kg)   BMI 23.94 kg/m   Constitutional: generally well-appearing, no acute distress Psychiatric: alert and oriented x3, cooperative Eyes: extraocular movements intact, anicteric, conjunctiva pink Mouth: oral pharynx moist, no lesions Neck: supple no lymphadenopathy Cardiovascular: heart regular rate and rhythm, no murmur Lungs: clear to auscultation bilaterally Abdomen: soft, nontender, nondistended, no obvious ascites, no peritoneal signs, normal bowel sounds, no organomegaly Rectal: No external abnormalities.  Inflamed internal hemorrhoids.  Brown  stool Extremities: no clubbing, cyanosis, or lower extremity edema bilaterally Skin: no lesions on visible extremities Neuro: No focal deficits.  Cranial nerves intact  ASSESSMENT:  1.  Acute viral illness manifested by self-limited nausea, vomiting, diarrhea. 2.  Zofran-induced constipation 3.  Constipation induced abdominal pain.  Resolved 4.  Laxative induced diarrhea.  Resolved 5.  Diarrhea induced hemorrhoidal bleeding.  Resolved 6.  Active GERD symptoms and dysphagia.  Last EGD with dilation 2020.  On Pepcid 7.  IBS with chronic functional abdominal complaints.  Ongoing 8.  Multiple previous colonoscopies.  Last colonoscopy here at May 2016.  Normal examination including intubation of the terminal ileum.  PLAN:  1.  Reassurance regarding her acute illness and the subsequent series of events.  Her entire work-up and assessment reviewed with the patient personally.  Multiple questions answered. 2.  Prescribe pantoprazole 40 mg daily.  Medication risks reviewed 3.  Reflux precautions 4.  Schedule follow-up with Dr. Hilarie Fredrickson in about 6 weeks.  This to see if she has responded to PPI in terms of reflux symptoms as well as dysphagia.  If not, other medical therapies could be considered.  As well repeat upper endoscopy with esophageal dilation can be considered.  Her ongoing problems with chronic functional irritable bowel also be addressed as needed. 5.  Consider repeat screening colonoscopy around 2026 A total time of 40 minutes was spent preparing to see the patient, reviewing a myriad of outside test and x-rays, reviewing prior endoscopy reports and pathology, obtaining comprehensive interval history and performing comprehensive medical examination.  Counseling the patient regarding her above listed issues.  Answered multiple questions.  Prescribing medications.  Arranging follow-up.  And documenting clinical information in the health record

## 2019-11-15 ENCOUNTER — Ambulatory Visit: Payer: Medicare Other | Admitting: Internal Medicine

## 2019-12-15 ENCOUNTER — Other Ambulatory Visit: Payer: Self-pay | Admitting: Internal Medicine

## 2019-12-15 NOTE — Telephone Encounter (Signed)
Patient has not been seen since 05/2018. Please advise.

## 2019-12-21 ENCOUNTER — Encounter: Payer: Self-pay | Admitting: Internal Medicine

## 2019-12-21 ENCOUNTER — Ambulatory Visit (INDEPENDENT_AMBULATORY_CARE_PROVIDER_SITE_OTHER): Payer: Medicare Other | Admitting: Internal Medicine

## 2019-12-21 VITALS — BP 108/70 | HR 87 | Ht 62.5 in | Wt 136.0 lb

## 2019-12-21 DIAGNOSIS — K219 Gastro-esophageal reflux disease without esophagitis: Secondary | ICD-10-CM | POA: Diagnosis not present

## 2019-12-21 DIAGNOSIS — R1310 Dysphagia, unspecified: Secondary | ICD-10-CM | POA: Diagnosis not present

## 2019-12-21 DIAGNOSIS — R1010 Upper abdominal pain, unspecified: Secondary | ICD-10-CM

## 2019-12-21 DIAGNOSIS — K580 Irritable bowel syndrome with diarrhea: Secondary | ICD-10-CM

## 2019-12-21 DIAGNOSIS — R140 Abdominal distension (gaseous): Secondary | ICD-10-CM

## 2019-12-21 MED ORDER — DICYCLOMINE HCL 10 MG PO CAPS: 10.0000 mg | ORAL_CAPSULE | Freq: Three times a day (TID) | ORAL | 2 refills | Status: DC | PRN

## 2019-12-21 NOTE — Progress Notes (Signed)
Subjective:    Patient ID: Mary Rubio, female    DOB: 1945/07/29, 74 y.o.   MRN: [id number]  HPI Mary Rubio is a 74 year old female with a past medical history of IBS with diarrhea predominance, functional abdominal pain, GERD with associated esophageal eosinophilia and history of stricture with prior dilation, hyperlipidemia and hypothyroidism who is here for follow-up.  She was last seen in the office in September 2021 by Dr. Henrene Pastor.  At the time of her last visit she had recovered from an acute viral illness with nausea, vomiting and diarrhea.  She had used medications which caused constipation specifically Zofran.  Most of the symptoms have resolved.  He started pantoprazole at that time for active GERD symptoms with dysphagia.  She was previously on Pepcid.  She reports that for the most part she is doing okay though she has been dealing with some symptoms which she thinks have been exacerbated since her illness in September.  She really feels that this was likely food poisoning and perhaps Salmonella as there was an outbreak locally around that time.  She is still having issues with upper abdominal pain and bloating particularly after a meal.  At times it feels like there is a "blowtorch" in her upper stomach.  There is some nausea and belching.  At times belching helps and at other times she feels like she needs to belch but cannot.  6 to 8 hours after eating the pain moves more towards the lower abdomen and is crampy in nature.  Stools are 2-3 times per day and mostly loose.  Occasionally she can skip a day without bowel movement.  Heartburn has been controlled with the pantoprazole which Dr. Henrene Pastor started.  She still having some solid food dysphagia symptom.  No blood in stool or melena.  Prior cholecystectomy  Review of Systems As per HPI, otherwise negative  Current Medications, Allergies, Past Medical History, Past Surgical History, Family History and Social History  were reviewed in [employer] record.     Objective:   Physical Exam  Ht 5' 2.5" (1.588 m)   Wt 136 lb (61.7 kg)   BMI 24.48 kg/m  Gen: awake, alert, NAD HEENT: anicteric  CV: RRR, no mrg Pulm: CTA b/l Abd: soft, NT/ND, +BS throughout Ext: no c/c/e Neuro: nonfocal  CBC    Component Value Date/Time   WBC 10.3 09/27/2019 1721   RBC 4.53 09/27/2019 1721   HGB 13.9 09/27/2019 1721   HCT 42.0 09/27/2019 1721   PLT 218 09/27/2019 1721   MCV 92.7 09/27/2019 1721   MCV 92.0 08/14/2014 1752   MCH 30.7 09/27/2019 1721   MCHC 33.1 09/27/2019 1721   RDW 11.9 09/27/2019 1721   LYMPHSABS 2.0 06/07/2017 1533   MONOABS 0.6 06/07/2017 1533   EOSABS 0.2 06/07/2017 1533   BASOSABS 0.0 06/07/2017 1533   CMP     Component Value Date/Time   NA 139 09/27/2019 1721   K 3.6 09/27/2019 1721   CL 105 09/27/2019 1721   CO2 21 (L) 09/27/2019 1721   GLUCOSE 142 (H) 09/27/2019 1721   BUN 13 09/27/2019 1721   CREATININE 1.16 (H) 09/27/2019 1721   CREATININE 0.81 11/28/2014 1227   CALCIUM 9.1 09/27/2019 1721   PROT 7.1 09/27/2019 1721   ALBUMIN 4.2 09/27/2019 1721   AST 21 09/27/2019 1721   ALT 17 09/27/2019 1721   ALKPHOS 58 09/27/2019 1721   BILITOT 0.1 (L) 09/27/2019 1721   GFRNONAA 47 (L)  09/27/2019 1721   GFRNONAA 66 03/08/2013 1551   GFRAA 55 (L) 09/27/2019 1721   GFRAA 77 03/08/2013 1551         Assessment & Plan:  74 year old female with a past medical history of IBS with diarrhea predominance, functional abdominal pain, GERD with associated esophageal eosinophilia and history of stricture with prior dilation, hyperlipidemia and hypothyroidism who is here for follow-up.   1. GERD with hx of eosinophilia and dysphagia --heartburn has been well controlled and her eosinophilia is likely acid reflux related though I cannot completely exclude treated EoE.  Given ongoing intermittent dysphagia I recommended we repeat upper endoscopy with dilation --Continue  pantoprazole 40 mg daily --EGD in the [place] with probable dilation  2.  Abdominal pain/bloating/history of longstanding IBS --she does have longstanding IBS though some of her symptoms have been more acute and somewhat different since her probable infectious enteritis in September.  There is likely an element of postinfectious irritable bowel and perhaps disruption in her gut microbiota.  SIBO is also a possibility given her bloating. --SIBO breath testing --Add Bentyl 10 mg 3 times daily as needed  3. CRC screening --complete colonoscopy with ileal intubation in May 2016, repeat plan May 2026  30 minutes total spent today including patient facing time, coordination of care, reviewing medical history/procedures/pertinent radiology studies, and documentation of the encounter.

## 2019-12-21 NOTE — Patient Instructions (Signed)
We have sent the following medications to your pharmacy for you to pick up at your convenience: Bentyl 10 mg three times daily as needed  Continue pantoprazole 40 mg daily  You have been scheduled for an endoscopy. Please follow written instructions given to you at your visit today. If you use inhalers (even only as needed), please bring them with you on the day of your procedure.  You have been given a testing kit to check for small intestine bacterial overgrowth (SIBO) which is completed by a company named [facility]. Make sure to return your test in the mail using the return mailing label given to you along with the kit. Your demographic and insurance information have already been sent to the company and they should be in contact with you over the next week regarding this test. [facility] will collect an upfront charge of $99.74 for commercial insurance plans and $209.74 is you are paying cash. Make sure to discuss with [facility] PRIOR to having the test if they have gotten informatoin from your insurance company as to how much your testing will cost out of pocket, if any. Please keep in mind that you will be getting a call from phone number [phone] or a similar number. If you do not hear from them within this time frame, please call our office at [phone].   If you are age 72 or older, your body mass index should be between 23-30. Your Body mass index is 24.48 kg/m. If this is out of the aforementioned range listed, please consider follow up with your Primary Care Provider.  If you are age 47 or younger, your body mass index should be between 19-25. Your Body mass index is 24.48 kg/m. If this is out of the aformentioned range listed, please consider follow up with your Primary Care Provider.   Due to recent changes in healthcare laws, you may see the results of your imaging and laboratory studies on MyChart before your provider has had a chance to review them.  We  understand that in some cases there may be results that are confusing or concerning to you. Not all laboratory results come back in the same time frame and the provider may be waiting for multiple results in order to interpret others.  Please give [place] 48 hours in order for your provider to thoroughly review all the results before contacting the office for clarification of your results.

## 2020-01-08 NOTE — Progress Notes (Signed)
I have contacted [place] and have spoken with Waunita Schooner who indicates that as of 01/08/20, patient has not completed SIBO testing.

## 2020-01-28 DIAGNOSIS — Z23 Encounter for immunization: Secondary | ICD-10-CM | POA: Diagnosis not present

## 2020-02-01 ENCOUNTER — Encounter: Payer: Medicare Other | Admitting: Internal Medicine

## 2020-02-28 DIAGNOSIS — R3129 Other microscopic hematuria: Secondary | ICD-10-CM | POA: Diagnosis not present

## 2020-02-28 DIAGNOSIS — N289 Disorder of kidney and ureter, unspecified: Secondary | ICD-10-CM | POA: Diagnosis not present

## 2020-03-01 DIAGNOSIS — Z20822 Contact with and (suspected) exposure to covid-19: Secondary | ICD-10-CM | POA: Diagnosis not present

## 2020-03-12 DIAGNOSIS — M2550 Pain in unspecified joint: Secondary | ICD-10-CM | POA: Diagnosis not present

## 2020-03-12 DIAGNOSIS — E559 Vitamin D deficiency, unspecified: Secondary | ICD-10-CM | POA: Diagnosis not present

## 2020-03-12 DIAGNOSIS — M791 Myalgia, unspecified site: Secondary | ICD-10-CM | POA: Diagnosis not present

## 2020-03-12 DIAGNOSIS — M797 Fibromyalgia: Secondary | ICD-10-CM | POA: Diagnosis not present

## 2020-03-12 DIAGNOSIS — E785 Hyperlipidemia, unspecified: Secondary | ICD-10-CM | POA: Diagnosis not present

## 2020-03-12 DIAGNOSIS — E039 Hypothyroidism, unspecified: Secondary | ICD-10-CM | POA: Diagnosis not present

## 2020-03-12 DIAGNOSIS — R6889 Other general symptoms and signs: Secondary | ICD-10-CM | POA: Diagnosis not present

## 2020-03-18 DIAGNOSIS — E538 Deficiency of other specified B group vitamins: Secondary | ICD-10-CM | POA: Diagnosis not present

## 2020-04-15 DIAGNOSIS — E538 Deficiency of other specified B group vitamins: Secondary | ICD-10-CM | POA: Diagnosis not present

## 2020-04-19 DIAGNOSIS — Z1231 Encounter for screening mammogram for malignant neoplasm of breast: Secondary | ICD-10-CM | POA: Diagnosis not present

## 2020-05-17 DIAGNOSIS — E538 Deficiency of other specified B group vitamins: Secondary | ICD-10-CM | POA: Diagnosis not present

## 2020-06-13 DIAGNOSIS — E538 Deficiency of other specified B group vitamins: Secondary | ICD-10-CM | POA: Diagnosis not present

## 2020-06-13 DIAGNOSIS — M79622 Pain in left upper arm: Secondary | ICD-10-CM | POA: Diagnosis not present

## 2020-06-15 ENCOUNTER — Encounter (HOSPITAL_BASED_OUTPATIENT_CLINIC_OR_DEPARTMENT_OTHER): Payer: Self-pay

## 2020-06-15 ENCOUNTER — Emergency Department (HOSPITAL_BASED_OUTPATIENT_CLINIC_OR_DEPARTMENT_OTHER)
Admission: EM | Admit: 2020-06-15 | Discharge: 2020-06-15 | Disposition: A | Discharge status: Home / Self Care | Payer: Medicare Other | Attending: Emergency Medicine | Admitting: Emergency Medicine

## 2020-06-15 ENCOUNTER — Other Ambulatory Visit: Payer: Self-pay

## 2020-06-15 DIAGNOSIS — Z79899 Other long term (current) drug therapy: Secondary | ICD-10-CM | POA: Diagnosis not present

## 2020-06-15 DIAGNOSIS — Z8616 Personal history of COVID-19: Secondary | ICD-10-CM | POA: Diagnosis not present

## 2020-06-15 DIAGNOSIS — Z85828 Personal history of other malignant neoplasm of skin: Secondary | ICD-10-CM | POA: Insufficient documentation

## 2020-06-15 DIAGNOSIS — E039 Hypothyroidism, unspecified: Secondary | ICD-10-CM | POA: Diagnosis not present

## 2020-06-15 DIAGNOSIS — Z91040 Latex allergy status: Secondary | ICD-10-CM | POA: Insufficient documentation

## 2020-06-15 DIAGNOSIS — L03114 Cellulitis of left upper limb: Secondary | ICD-10-CM | POA: Diagnosis not present

## 2020-06-15 DIAGNOSIS — J45909 Unspecified asthma, uncomplicated: Secondary | ICD-10-CM | POA: Insufficient documentation

## 2020-06-15 DIAGNOSIS — M79602 Pain in left arm: Secondary | ICD-10-CM | POA: Diagnosis present

## 2020-06-15 MED ORDER — CLINDAMYCIN HCL 300 MG PO CAPS: 300.0000 mg | ORAL_CAPSULE | Freq: Four times a day (QID) | ORAL | 0 refills | Status: DC

## 2020-06-15 NOTE — ED Notes (Signed)
Pt discharged to home. Discharge instructions have been discussed with patient and/or family members. Pt verbally acknowledges understanding d/c instructions, and endorses comprehension to checkout at registration before leaving.  °

## 2020-06-15 NOTE — ED Provider Notes (Signed)
[facility] EMERGENCY DEPARTMENT Provider Note   CSN: [id number] Arrival date & time: 06/15/20  0745     History Chief Complaint  Patient presents with  . Abscess    Mary Rubio is a 75 y.o. female.  Patient is a 75 year old female who presents with pain under her left axilla.  She said it started as a red area that felt like a bruise in her left armpit 3 days ago.  She said that her primary care doctor checked it and they felt it may be a bug bite or something such as that.  She was instructed to use ibuprofen.  She has been doing this but it seems to be spreading.  She has some soreness now around her shoulder area and some knots in her neck on the left side.  She has no trouble swallowing.  No shortness of breath.  No known fevers.  No significant pain in the shoulder joint itself.  No pain in her abdomen.  No chest pain other than the pain around the shoulder area.  She has had some nausea but no vomiting.  No known injuries to the area.        Past Medical History:  Diagnosis Date  . Allergy   . Anxiety   . Asthma   . Colon polyps   . Depression   . Diverticulosis   . Environmental allergies    Weekly allergy shots  . Eosinophilic esophagitis   . Esophageal stricture   . Esophagitis   . GERD (gastroesophageal reflux disease)   . Hiatal hernia   . Hyperlipidemia   . Hypothyroidism   . IBS (irritable bowel syndrome)   . Osteopenia   . Osteoporosis   . Skin cancer    Left arm Squamous cell 10/10/2018    Patient Active Problem List   Diagnosis Date Noted  . Suspected COVID-19 virus infection 10/24/2018  . Esophageal obstruction due to food impaction   . Esophagitis   . Vitamin D deficiency 01/24/2015  . Osteopenia 07/24/2014  . Depression 06/11/2014  . Vaginal lesion 04/12/2013  . Dysphagia 05/26/2012  . Esophageal reflux 05/26/2012  . Diaphragmatic hernia 05/26/2012  . 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 Precordial pain 05/12/2012  .  Hypothyroidism 08/14/2007  . HLD (hyperlipidemia) 08/14/2007  . ALLERGIC RHINITIS 08/14/2007  . ASTHMA 08/14/2007    Past Surgical History:  Procedure Laterality Date  . ABDOMINAL HYSTERECTOMY    . BARTHOLIN GLAND CYST EXCISION    . BUNIONECTOMY Right   . CHOLECYSTECTOMY    . COLONOSCOPY  2016  . ESOPHAGOGASTRODUODENOSCOPY N/A 10/21/2012   Procedure: ESOPHAGOGASTRODUODENOSCOPY (EGD);  Surgeon: Beryle Beams, MD;  Location: [facility];  Service: Endoscopy;  Laterality: N/A;  . ESOPHAGOGASTRODUODENOSCOPY N/A 11/02/2017   Procedure: ESOPHAGOGASTRODUODENOSCOPY (EGD);  Surgeon: Jerene Bears, MD;  Location: [facility] ENDOSCOPY;  Service: Gastroenterology;  Laterality: N/A;  . EXPLORATORY LAPAROTOMY     endometriosis  . facial cyst     excision  . NASAL SEPTUM SURGERY    . SKIN CANCER EXCISION Left 10/10/2018  . thumb surgery Left    for cyst     OB History    Gravida  2   Para  2   Term  2   Preterm      AB      Living  1     SAB      IAB      Ectopic      Multiple  Live Births           Obstetric Comments  One miscarriage        Family History  Problem Relation Age of Onset  . Lung cancer Mother   . Hypertension Mother   . Thyroid disease Mother   . Heart disease Mother   . Skin cancer Father   . Dementia Father   . Arrhythmia Father   . Heart disease Father        Several relatives on dad's side have had MIs/CABG  . Alzheimer's disease Father   . Heart disease Sister   . Thyroid disease Daughter   . Heart disease Maternal Grandfather   . Heart disease Paternal Grandmother   . CAD Sister        MI/stent age 77, CABG age 38  . Thyroid disease Sister   . Colon cancer Neg Hx   . Esophageal cancer Neg Hx   . Stomach cancer Neg Hx   . Rectal cancer Neg Hx     Social History   Tobacco Use  . Smoking status: Never Smoker  . Smokeless tobacco: Never Used  Vaping Use  . Vaping Use: Never used  Substance Use Topics  . Alcohol use: Not  Currently    Alcohol/week: 0.0 standard drinks    Comment: occ beer  . Drug use: No    Home Medications Prior to Admission medications   Medication Sig Start Date End Date Taking? Authorizing Provider  azelastine (ASTELIN) 0.1 % nasal spray  05/06/18  Yes [provider]  azelastine (OPTIVAR) 0.05 % ophthalmic solution Place 1 drop into both eyes daily.    Yes [provider]  cholecalciferol (VITAMIN D) 1000 units tablet Take 2,000 Units by mouth daily.   Yes [provider]  clindamycin (CLEOCIN) 300 MG capsule Take 1 capsule (300 mg total) by mouth 4 (four) times daily. X 7 days 06/15/20  Yes Malvin Johns, MD  pantoprazole (PROTONIX) 40 MG tablet Take 1 tablet (40 mg total) by mouth daily. 09/29/19  Yes Irene Shipper, MD  SYNTHROID 88 MCG tablet Take 1 tablet (88 mcg total) by mouth daily before breakfast. 11/10/18  Yes Philemon Kingdom, MD  albuterol (PROVENTIL HFA;VENTOLIN HFA) 108 (90 Base) MCG/ACT inhaler Inhale 2 puffs into the lungs every 4 (four) hours as needed.     [provider]  dicyclomine (BENTYL) 10 MG capsule Take 1 capsule (10 mg total) by mouth 3 (three) times daily as needed for spasms. 12/21/19   Pyrtle, Lajuan Lines, MD  EPINEPHrine 0.3 mg/0.3 mL IJ SOAJ injection Inject 0.3 mg into the muscle once.    [provider]  metoprolol tartrate (LOPRESSOR) 100 MG tablet Take 1 tablet (100 mg total) by mouth once for 1 dose. Take 1 tablet (100 mg total) 2 hours prior to CT scan 06/05/19 06/05/19  Sueanne Margarita, MD    Allergies    Augmentin [amoxicillin-pot clavulanate], Crestor [rosuvastatin], Demerol [meperidine], Latex, Meperidine hcl, Morphine and related, Pentazocine lactate, Simvastatin, Talwin [pentazocine], and Codeine  Review of Systems   Review of Systems  Constitutional: Negative for chills, diaphoresis, fatigue and fever.  HENT: Negative for congestion, rhinorrhea and sneezing.   Eyes: Negative.   Respiratory: Negative for  cough, chest tightness and shortness of breath.   Cardiovascular: Negative for chest pain and leg swelling.  Gastrointestinal: Positive for nausea. Negative for abdominal pain, blood in stool, diarrhea and vomiting.  Genitourinary: Negative for difficulty urinating, flank pain, frequency and  hematuria.  Musculoskeletal: Positive for neck pain. Negative for arthralgias and back pain.  Skin: Positive for color change and wound. Negative for rash.  Neurological: Negative for dizziness, speech difficulty, weakness, numbness and headaches.    Physical Exam Updated Vital Signs BP (!) 154/94 (BP Location: Right Arm)   Pulse (!) 109   Temp 98.2 F (36.8 C) (Oral)   Resp 18   Ht 5\' 2"  (1.575 m)   Wt 61.2 kg   SpO2 100%   BMI 24.69 kg/m   Physical Exam Constitutional:      Appearance: She is well-developed.  HENT:     Head: Normocephalic and atraumatic.  Eyes:     Pupils: Pupils are equal, round, and reactive to light.  Cardiovascular:     Rate and Rhythm: Normal rate and regular rhythm.     Heart sounds: Normal heart sounds.  Pulmonary:     Effort: Pulmonary effort is normal. No respiratory distress.     Breath sounds: Normal breath sounds. No wheezing or rales.  Chest:     Chest wall: No tenderness.  Abdominal:     General: Bowel sounds are normal.     Palpations: Abdomen is soft.     Tenderness: There is no abdominal tenderness. There is no guarding or rebound.  Musculoskeletal:        General: Normal range of motion.     Cervical back: Normal range of motion and neck supple.     Comments: Patient has a 2 cm erythematous area in her left axilla.  There is some minor puffiness around the area but there is no induration or fluctuance.  There is some tender lymph nodes in the axilla as well.  She also has some generalized soreness around the shoulder on the left.  But there is no specific tenderness in the shoulder joint or noted effusion.  She has some soreness at the base of the  left neck which appears to be consistent with some swollen tender lymph nodes.  I do not appreciate any other swelling of the neck.  No angioedema is noted.  Lymphadenopathy:     Cervical: No cervical adenopathy.  Skin:    General: Skin is warm and dry.     Findings: No rash.  Neurological:     Mental Status: She is alert and oriented to person, place, and time.     ED Results / Procedures / Treatments   Labs (all labs ordered are listed, but only abnormal results are displayed) Labs Reviewed - No data to display  EKG None  Radiology No results found.  Procedures Procedures   Medications Ordered in ED Medications - No data to display  ED Course  I have reviewed the triage vital signs and the nursing notes.  Pertinent labs & imaging results that were available during my care of the patient were reviewed by me and considered in my medical decision making (see chart for details).    MDM Rules/Calculators/A&P                          Patient presents with some swelling under her left axilla with a small area of redness.  I do not feel any drainable abscess.  I do not feel any signs of a deeper tissue infection.  She has some generalized soreness around the area and some tender lymph nodes.  I do not appreciate suggestions of a septic joint.  I do not see any  evidence of deep tissue infection that would warrant further imaging.  It does not appear to be coming from her breast.  At this point the etiology of her symptoms are not clear but given the area of redness, I would suspect a cellulitis.  We will do a trial of clindamycin but she was given strict return precautions and advised to follow-up with her PCP next week for recheck. Final Clinical Impression(s) / ED Diagnoses Final diagnoses:  Cellulitis of left upper extremity    Rx / DC Orders ED Discharge Orders         Ordered    clindamycin (CLEOCIN) 300 MG capsule  4 times daily        06/15/20 0859            Malvin Johns, MD 06/15/20 [phone]

## 2020-06-15 NOTE — Discharge Instructions (Addendum)
Return to the emergency room if you have any worsening or progressive symptoms.  Follow-up with your primary care doctor next week for recheck.

## 2020-06-15 NOTE — ED Triage Notes (Signed)
Mass/lump under L axilla x 3 days.  Seen by PCP for same, pt also c/o high BP, nausea, and hip pain.

## 2020-06-18 ENCOUNTER — Emergency Department (HOSPITAL_BASED_OUTPATIENT_CLINIC_OR_DEPARTMENT_OTHER): Payer: Medicare Other

## 2020-06-18 ENCOUNTER — Encounter (HOSPITAL_BASED_OUTPATIENT_CLINIC_OR_DEPARTMENT_OTHER): Payer: Self-pay

## 2020-06-18 ENCOUNTER — Emergency Department (HOSPITAL_BASED_OUTPATIENT_CLINIC_OR_DEPARTMENT_OTHER)
Admission: EM | Admit: 2020-06-18 | Discharge: 2020-06-18 | Disposition: A | Discharge status: Home / Self Care | Payer: Medicare Other | Attending: Emergency Medicine | Admitting: Emergency Medicine

## 2020-06-18 ENCOUNTER — Other Ambulatory Visit: Payer: Self-pay

## 2020-06-18 DIAGNOSIS — E039 Hypothyroidism, unspecified: Secondary | ICD-10-CM | POA: Diagnosis not present

## 2020-06-18 DIAGNOSIS — Z91040 Latex allergy status: Secondary | ICD-10-CM | POA: Insufficient documentation

## 2020-06-18 DIAGNOSIS — R591 Generalized enlarged lymph nodes: Secondary | ICD-10-CM | POA: Diagnosis not present

## 2020-06-18 DIAGNOSIS — R590 Localized enlarged lymph nodes: Secondary | ICD-10-CM | POA: Diagnosis not present

## 2020-06-18 DIAGNOSIS — I700 Atherosclerosis of aorta: Secondary | ICD-10-CM | POA: Diagnosis not present

## 2020-06-18 DIAGNOSIS — J45909 Unspecified asthma, uncomplicated: Secondary | ICD-10-CM | POA: Diagnosis not present

## 2020-06-18 DIAGNOSIS — Z79899 Other long term (current) drug therapy: Secondary | ICD-10-CM | POA: Diagnosis not present

## 2020-06-18 DIAGNOSIS — M79602 Pain in left arm: Secondary | ICD-10-CM | POA: Diagnosis not present

## 2020-06-18 DIAGNOSIS — Z85828 Personal history of other malignant neoplasm of skin: Secondary | ICD-10-CM | POA: Insufficient documentation

## 2020-06-18 DIAGNOSIS — R599 Enlarged lymph nodes, unspecified: Secondary | ICD-10-CM | POA: Insufficient documentation

## 2020-06-18 DIAGNOSIS — Z7952 Long term (current) use of systemic steroids: Secondary | ICD-10-CM | POA: Insufficient documentation

## 2020-06-18 DIAGNOSIS — R9431 Abnormal electrocardiogram [ECG] [EKG]: Secondary | ICD-10-CM | POA: Diagnosis not present

## 2020-06-18 DIAGNOSIS — M7989 Other specified soft tissue disorders: Secondary | ICD-10-CM | POA: Diagnosis not present

## 2020-06-18 LAB — BASIC METABOLIC PANEL
Anion gap: 10 (ref 5–15)
BUN: 11 mg/dL (ref 8–23)
CO2: 23 mmol/L (ref 22–32)
Calcium: 9.2 mg/dL (ref 8.9–10.3)
Chloride: 104 mmol/L (ref 98–111)
Creatinine, Ser: 0.98 mg/dL (ref 0.44–1.00)
GFR, Estimated: >60 mL/min (ref >60)
Glucose, Bld: 94 mg/dL (ref 70–99)
Potassium: 3.7 mmol/L (ref 3.5–5.1)
Sodium: 137 mmol/L (ref 135–145)

## 2020-06-18 LAB — CBC WITH DIFFERENTIAL/PLATELET
Abs Immature Granulocytes: 0.02 10*3/uL (ref 0.00–0.07)
Basophils Absolute: 0 10*3/uL (ref 0.0–0.1)
Basophils Relative: 1 %
Eosinophils Absolute: 0.1 10*3/uL (ref 0.0–0.5)
Eosinophils Relative: 2 %
HCT: 40.3 % (ref 36.0–46.0)
Hemoglobin: 14.1 g/dL (ref 12.0–15.0)
Immature Granulocytes: 0 %
Lymphocytes Relative: 28 %
Lymphs Abs: 1.4 10*3/uL (ref 0.7–4.0)
MCH: 31.6 pg (ref 26.0–34.0)
MCHC: 35 g/dL (ref 30.0–36.0)
MCV: 90.4 fL (ref 80.0–100.0)
Monocytes Absolute: 0.4 10*3/uL (ref 0.1–1.0)
Monocytes Relative: 7 %
Neutro Abs: 3.1 10*3/uL (ref 1.7–7.7)
Neutrophils Relative %: 62 %
Platelets: 206 10*3/uL (ref 150–400)
RBC: 4.46 MIL/uL (ref 3.87–5.11)
RDW: 12.2 % (ref 11.5–15.5)
WBC: 5 10*3/uL (ref 4.0–10.5)
nRBC: 0 % (ref 0.0–0.2)

## 2020-06-18 NOTE — ED Provider Notes (Signed)
[facility] EMERGENCY DEPARTMENT Provider Note   CSN: [id number] Arrival date & time: 06/18/20  1008     History Chief Complaint  Patient presents with  . Wound Check    Mary Rubio is a 75 y.o. female.  The history is provided by the patient and medical records.  Wound Check   Mary Rubio is a 75 y.o. female who presents to the Emergency Department complaining of swollen lymph nodes. She presents the emergency department for tender and swollen lymph nodes to the left axilla that started several days ago. She was evaluated in the emergency department and started on clindamycin for possible cellulitis and presents for reevaluation due to ongoing and worsening pain. She has pain throughout the left upper arm, left axilla. She feels swollen in the left forearm. She did have dental work performed about two weeks ago. She was on antibiotics at that time but those were discontinued. Swelling started shortly after she finished her prior antibiotics. No fevers, chest pain, shortness of breath. She does get regular mammograms.    Past Medical History:  Diagnosis Date  . Allergy   . Anxiety   . Asthma   . Colon polyps   . Depression   . Diverticulosis   . Environmental allergies    Weekly allergy shots  . Eosinophilic esophagitis   . Esophageal stricture   . Esophagitis   . GERD (gastroesophageal reflux disease)   . Hiatal hernia   . Hyperlipidemia   . Hypothyroidism   . IBS (irritable bowel syndrome)   . Osteopenia   . Osteoporosis   . Skin cancer    Left arm Squamous cell 10/10/2018    Patient Active Problem List   Diagnosis Date Noted  . Suspected COVID-19 virus infection 10/24/2018  . Esophageal obstruction due to food impaction   . Esophagitis   . Vitamin D deficiency 01/24/2015  . Osteopenia 07/24/2014  . Depression 06/11/2014  . Vaginal lesion 04/12/2013  . Dysphagia 05/26/2012  . Esophageal reflux 05/26/2012  . Diaphragmatic hernia  05/26/2012  . 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 Precordial pain 05/12/2012  . Hypothyroidism 08/14/2007  . HLD (hyperlipidemia) 08/14/2007  . ALLERGIC RHINITIS 08/14/2007  . ASTHMA 08/14/2007    Past Surgical History:  Procedure Laterality Date  . ABDOMINAL HYSTERECTOMY    . BARTHOLIN GLAND CYST EXCISION    . BUNIONECTOMY Right   . CHOLECYSTECTOMY    . COLONOSCOPY  2016  . ESOPHAGOGASTRODUODENOSCOPY N/A 10/21/2012   Procedure: ESOPHAGOGASTRODUODENOSCOPY (EGD);  Surgeon: Beryle Beams, MD;  Location: [facility];  Service: Endoscopy;  Laterality: N/A;  . ESOPHAGOGASTRODUODENOSCOPY N/A 11/02/2017   Procedure: ESOPHAGOGASTRODUODENOSCOPY (EGD);  Surgeon: Jerene Bears, MD;  Location: [facility] END[facility]COPY;  Service: Gastroenterology;  Laterality: N/A;  . EXPLORATORY LAPAROTOMY     endometriosis  . facial cyst     excision  . NASAL SEPTUM SURGERY    . SKIN CANCER EXCISION Left 10/10/2018  . thumb surgery Left    for cyst     OB History    Gravida  2   Para  2   Term  2   Preterm      AB      Living  1     SAB      IAB      Ectopic      Multiple      Live Births           Obstetric Comments  One miscarriage  Family History  Problem Relation Age of Onset  . Lung cancer Mother   . Hypertension Mother   . Thyroid disease Mother   . Heart disease Mother   . Skin cancer Father   . Dementia Father   . Arrhythmia Father   . Heart disease Father        Several relatives on dad's side have had MIs/CABG  . Alzheimer's disease Father   . Heart disease Sister   . Thyroid disease Daughter   . Heart disease Maternal Grandfather   . Heart disease Paternal Grandmother   . CAD Sister        MI/stent age 23, CABG age 29  . Thyroid disease Sister   . Colon cancer Neg Hx   . Esophageal cancer Neg Hx   . Stomach cancer Neg Hx   . Rectal cancer Neg Hx     Social History   Tobacco Use  . Smoking status: Never Smoker  . Smokeless tobacco: Never Used  Vaping  Use  . Vaping Use: Never used  Substance Use Topics  . Alcohol use: Not Currently    Alcohol/week: 0.0 standard drinks    Comment: occ beer  . Drug use: No    Home Medications Prior to Admission medications   Medication Sig Start Date End Date Taking? Authorizing Provider  albuterol (PROVENTIL HFA;VENTOLIN HFA) 108 (90 Base) MCG/ACT inhaler Inhale 2 puffs into the lungs every 4 (four) hours as needed.     [provider]  azelastine (ASTELIN) 0.1 % nasal spray  05/06/18   [provider]  azelastine (OPTIVAR) 0.05 % ophthalmic solution Place 1 drop into both eyes daily.     [provider]  cholecalciferol (VITAMIN D) 1000 units tablet Take 2,000 Units by mouth daily.    [provider]  clindamycin (CLEOCIN) 300 MG capsule Take 1 capsule (300 mg total) by mouth 4 (four) times daily. X 7 days 06/15/20   Malvin Johns, MD  dicyclomine (BENTYL) 10 MG capsule Take 1 capsule (10 mg total) by mouth 3 (three) times daily as needed for spasms. 12/21/19   Pyrtle, Lajuan Lines, MD  EPINEPHrine 0.3 mg/0.3 mL IJ SOAJ injection Inject 0.3 mg into the muscle once.    [provider]  metoprolol tartrate (LOPRESSOR) 100 MG tablet Take 1 tablet (100 mg total) by mouth once for 1 dose. Take 1 tablet (100 mg total) 2 hours prior to CT scan 06/05/19 06/05/19  Sueanne Margarita, MD  pantoprazole (PROTONIX) 40 MG tablet Take 1 tablet (40 mg total) by mouth daily. 09/29/19   Irene Shipper, MD  SYNTHROID 88 MCG tablet Take 1 tablet (88 mcg total) by mouth daily before breakfast. 11/10/18   Philemon Kingdom, MD    Allergies    Augmentin [amoxicillin-pot clavulanate], Crestor [rosuvastatin], Demerol [meperidine], Latex, Meperidine hcl, Morphine and related, Pentazocine lactate, Simvastatin, Talwin [pentazocine], and Codeine  Review of Systems   Review of Systems  All other systems reviewed and are negative.   Physical Exam Updated Vital Signs BP 135/83 (BP Location: Right  Arm)   Pulse 88   Temp 98.6 F (37 C) (Oral)   Resp 18   Ht 5' 2.5" (1.588 m)   Wt 61.2 kg   SpO2 98%   BMI 24.30 kg/m   Physical Exam Vitals and nursing note reviewed.  Constitutional:      Appearance: She is well-developed.  HENT:     Head: Normocephalic and atraumatic.  Cardiovascular:  Rate and Rhythm: Normal rate and regular rhythm.     Heart sounds: No murmur heard.   Pulmonary:     Effort: Pulmonary effort is normal. No respiratory distress.     Breath sounds: Normal breath sounds.  Abdominal:     Palpations: Abdomen is soft.     Tenderness: There is no abdominal tenderness. There is no guarding or rebound.  Musculoskeletal:     Comments: There is matted lymphadenopathy in the left axilla. There is mild soft tissue swelling to the left forearm without any erythema. Range of motion is intact throughout the left upper extremity. There is tenderness to palpation throughout the left upper arm.  Skin:    General: Skin is warm and dry.  Neurological:     Mental Status: She is alert and oriented to person, place, and time.  Psychiatric:        Behavior: Behavior normal.     ED Results / Procedures / Treatments   Labs (all labs ordered are listed, but only abnormal results are displayed) Labs Reviewed  BASIC METABOLIC PANEL  CBC WITH DIFFERENTIAL/PLATELET    EKG None  Radiology No results found.  Procedures Procedures   Medications Ordered in ED Medications - No data to display  ED Course  I have reviewed the triage vital signs and the nursing notes.  Pertinent labs & imaging results that were available during my care of the patient were reviewed by me and considered in my medical decision making (see chart for details).    MDM Rules/Calculators/A&P                         patient here for evaluation of left arm pain for the last several days. On examination she has tender lymphadenopathy to the left axillary region with mild soft tissue swelling  to the left arm. There is no significant overlying erythema. Presentation is not consistent with abscess, necrotizing soft tissue infection. Images are negative for acute bony abnormality. Discussed with patient unclear source of her lymphadenopathy. Discussed importance of PCP follow-up for further evaluation as well as return precautions.   Final Clinical Impression(s) / ED Diagnoses Final diagnoses:  None    Rx / DC Orders ED Discharge Orders    None       Quintella Reichert, MD 06/18/20 1504

## 2020-06-18 NOTE — ED Triage Notes (Signed)
Pt was seen on 5/28, diagnosed with cellulitis. Has lumps under left arm, states lymph nodes are swollen. Prescribed antibiotics, was told to come in if not better in a few days. Decreased appetite and nausea.

## 2020-06-18 NOTE — ED Provider Notes (Signed)
Emergency Medicine Provider Triage Evaluation Note  Mary Rubio , a 75 y.o. female  was evaluated in triage.  Pt complains of lump to left axilla, swollen lymph nodes, and headache.  Patient reports that swelling to left axilla started over the last few days.  Patient was seen in emergency department and started on clindamycin.  Has been taking this medication as prescribed.  No change in left axilla swelling.  Review of Systems  Positive: Left axilla swelling, headache Negative: Fever, chills  Physical Exam  BP (!) 149/90 (BP Location: Right Arm)   Pulse (!) 104   Temp 98.6 F (37 C) (Oral)   Resp 18   Ht 5' 2.5" (1.588 m)   Wt 61.2 kg   SpO2 98%   BMI 24.30 kg/m  Gen:   Awake, no distress   Resp:  Normal effort MSK:   Moves extremities without difficulty  Other:    Medical Decision Making  Medically screening exam initiated at 11:48 AM.  Appropriate orders placed.  Mary Rubio was informed that the remainder of the evaluation will be completed by another provider, this initial triage assessment does not replace that evaluation, and the importance of remaining in the ED until their evaluation is complete.     Loni Beckwith, PA-C 06/18/20 1149    Quintella Reichert, MD 06/21/20 [phone]

## 2020-06-26 DIAGNOSIS — Z09 Encounter for follow-up examination after completed treatment for conditions other than malignant neoplasm: Secondary | ICD-10-CM | POA: Diagnosis not present

## 2020-06-26 DIAGNOSIS — M542 Cervicalgia: Secondary | ICD-10-CM | POA: Diagnosis not present

## 2020-06-26 DIAGNOSIS — M79602 Pain in left arm: Secondary | ICD-10-CM | POA: Diagnosis not present

## 2020-07-11 DIAGNOSIS — H40013 Open angle with borderline findings, low risk, bilateral: Secondary | ICD-10-CM | POA: Diagnosis not present

## 2020-07-11 DIAGNOSIS — H35373 Puckering of macula, bilateral: Secondary | ICD-10-CM | POA: Diagnosis not present

## 2020-07-11 DIAGNOSIS — H2513 Age-related nuclear cataract, bilateral: Secondary | ICD-10-CM | POA: Diagnosis not present

## 2020-07-15 DIAGNOSIS — E538 Deficiency of other specified B group vitamins: Secondary | ICD-10-CM | POA: Diagnosis not present

## 2020-07-16 DIAGNOSIS — A281 Cat-scratch disease: Secondary | ICD-10-CM | POA: Diagnosis not present

## 2020-07-19 DIAGNOSIS — U071 COVID-19: Secondary | ICD-10-CM

## 2020-07-19 HISTORY — DX: COVID-19: U07.1

## 2020-07-29 ENCOUNTER — Other Ambulatory Visit: Payer: Self-pay

## 2020-07-29 ENCOUNTER — Encounter (HOSPITAL_BASED_OUTPATIENT_CLINIC_OR_DEPARTMENT_OTHER): Payer: Self-pay | Admitting: Emergency Medicine

## 2020-07-29 ENCOUNTER — Emergency Department (HOSPITAL_BASED_OUTPATIENT_CLINIC_OR_DEPARTMENT_OTHER): Payer: Medicare Other

## 2020-07-29 ENCOUNTER — Emergency Department (HOSPITAL_BASED_OUTPATIENT_CLINIC_OR_DEPARTMENT_OTHER)
Admission: EM | Admit: 2020-07-29 | Discharge: 2020-07-29 | Disposition: A | Discharge status: Home / Self Care | Payer: Medicare Other | Attending: Emergency Medicine | Admitting: Emergency Medicine

## 2020-07-29 DIAGNOSIS — R509 Fever, unspecified: Secondary | ICD-10-CM | POA: Diagnosis not present

## 2020-07-29 DIAGNOSIS — Z91040 Latex allergy status: Secondary | ICD-10-CM | POA: Diagnosis not present

## 2020-07-29 DIAGNOSIS — K219 Gastro-esophageal reflux disease without esophagitis: Secondary | ICD-10-CM | POA: Diagnosis not present

## 2020-07-29 DIAGNOSIS — J45909 Unspecified asthma, uncomplicated: Secondary | ICD-10-CM | POA: Diagnosis not present

## 2020-07-29 DIAGNOSIS — R1084 Generalized abdominal pain: Secondary | ICD-10-CM | POA: Insufficient documentation

## 2020-07-29 DIAGNOSIS — U071 COVID-19: Secondary | ICD-10-CM | POA: Insufficient documentation

## 2020-07-29 DIAGNOSIS — E039 Hypothyroidism, unspecified: Secondary | ICD-10-CM | POA: Diagnosis not present

## 2020-07-29 DIAGNOSIS — Z79899 Other long term (current) drug therapy: Secondary | ICD-10-CM | POA: Diagnosis not present

## 2020-07-29 DIAGNOSIS — R9431 Abnormal electrocardiogram [ECG] [EKG]: Secondary | ICD-10-CM | POA: Diagnosis not present

## 2020-07-29 DIAGNOSIS — R519 Headache, unspecified: Secondary | ICD-10-CM | POA: Diagnosis not present

## 2020-07-29 DIAGNOSIS — Z85828 Personal history of other malignant neoplasm of skin: Secondary | ICD-10-CM | POA: Insufficient documentation

## 2020-07-29 DIAGNOSIS — J029 Acute pharyngitis, unspecified: Secondary | ICD-10-CM | POA: Diagnosis not present

## 2020-07-29 DIAGNOSIS — R059 Cough, unspecified: Secondary | ICD-10-CM | POA: Diagnosis not present

## 2020-07-29 MED ORDER — BENZONATATE 100 MG PO CAPS: 100.0000 mg | ORAL_CAPSULE | Freq: Four times a day (QID) | ORAL | 0 refills | Status: DC | PRN

## 2020-07-29 MED ORDER — ALBUTEROL SULFATE HFA 108 (90 BASE) MCG/ACT IN AERS: 2.0000 | INHALATION_SPRAY | Freq: Four times a day (QID) | RESPIRATORY_TRACT | 2 refills | Status: AC | PRN

## 2020-07-29 MED ORDER — ONDANSETRON 4 MG PO TBDP
8.0000 mg | ORAL_TABLET | Freq: Once | ORAL | Status: DC
  Filled 2020-07-29: qty 2

## 2020-07-29 MED ORDER — DICYCLOMINE HCL 10 MG PO CAPS
10.0000 mg | ORAL_CAPSULE | Freq: Once | ORAL | Status: DC
  Filled 2020-07-29: qty 1

## 2020-07-29 NOTE — Discharge Instructions (Addendum)
See your Physician for recheck.  Return if any problems.  °

## 2020-07-29 NOTE — ED Notes (Signed)
Pt states that she does not want to take Zofran because "it constiated me so bad I wound up in the ER"  Pt also reports she is out of her inhaler at home.

## 2020-07-29 NOTE — ED Notes (Signed)
ED Provider at bedside. 

## 2020-07-29 NOTE — ED Notes (Signed)
XR at bedside

## 2020-07-29 NOTE — ED Triage Notes (Signed)
Pt arrives pov with c/o HA, cough, generalized body aches, fever, sore throat and congestion. X 3 days. Pt endorses Positive home covid test, daughter was covid + last week. Endorses concern for pneumonia, asthma patient, expired inhaler. Pt denies shob. Pt also c/o N/V/D since yesterday

## 2020-07-30 NOTE — ED Provider Notes (Signed)
[place] EMERGENCY DEPARTMENT Provider Note   CSN: [id number] Arrival date & time: 07/29/20  1415     History Chief Complaint  Patient presents with   Abdominal Pain    Mary Rubio is a 75 y.o. female.  The history is provided by the patient. No language interpreter was used.  Abdominal Pain Pain location:  Generalized Pain quality: aching   Pain radiates to:  Does not radiate Pain severity:  Moderate Onset quality:  Gradual Duration:  3 days Progression:  Worsening Chronicity:  New Relieved by:  Nothing Worsened by:  Nothing Ineffective treatments:  None tried Associated symptoms: no chills and no nausea   Pt reports she has covid.  Pt reports several family members have had     Past Medical History:  Diagnosis Date   Allergy    Anxiety    Asthma    Colon polyps    Depression    Diverticulosis    Environmental allergies    Weekly allergy shots   Eosinophilic esophagitis    Esophageal stricture    Esophagitis    GERD (gastroesophageal reflux disease)    Hiatal hernia    Hyperlipidemia    Hypothyroidism    IBS (irritable bowel syndrome)    Osteopenia    Osteoporosis    Skin cancer    Left arm Squamous cell 10/10/2018    Patient Active Problem List   Diagnosis Date Noted   Suspected COVID-19 virus infection 10/24/2018   Esophageal obstruction due to food impaction    Esophagitis    Vitamin D deficiency 01/24/2015   Osteopenia 07/24/2014   Depression 06/11/2014   Vaginal lesion 04/12/2013   Dysphagia 05/26/2012   Esophageal reflux 05/26/2012   Diaphragmatic hernia 05/26/2012   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   Precordial pain 05/12/2012   Hypothyroidism 08/14/2007   HLD (hyperlipidemia) 08/14/2007   ALLERGIC RHINITIS 08/14/2007   ASTHMA 08/14/2007    Past Surgical History:  Procedure Laterality Date   ABDOMINAL HYSTERECTOMY     BARTHOLIN GLAND CYST EXCISION     BUNIONECTOMY Right    CHOLECYSTECTOMY     COLONOSCOPY   2016   ESOPHAGOGASTRODUODENOSCOPY N/A 10/21/2012   Procedure: ESOPHAGOGASTRODUODENOSCOPY (EGD);  Surgeon: Beryle Beams, MD;  Location: [facility];  Service: Endoscopy;  Laterality: N/A;   ESOPHAGOGASTRODUODENOSCOPY N/A 11/02/2017   Procedure: ESOPHAGOGASTRODUODENOSCOPY (EGD);  Surgeon: Jerene Bears, MD;  Location: [facility] ENDOSCOPY;  Service: Gastroenterology;  Laterality: N/A;   EXPLORATORY LAPAROTOMY     endometriosis   facial cyst     excision   NASAL SEPTUM SURGERY     SKIN CANCER EXCISION Left 10/10/2018   thumb surgery Left    for cyst     OB History     Gravida  2   Para  2   Term  2   Preterm      AB      Living  1      SAB      IAB      Ectopic      Multiple      Live Births           Obstetric Comments  One miscarriage         Family History  Problem Relation Age of Onset   Lung cancer Mother    Hypertension Mother    Thyroid disease Mother    Heart disease Mother    Skin cancer Father    Dementia Father  Arrhythmia Father    Heart disease Father        Several relatives on dad's side have had MIs/CABG   Alzheimer's disease Father    Heart disease Sister    Thyroid disease Daughter    Heart disease Maternal Grandfather    Heart disease Paternal 4    CAD Sister        MI/stent age 79, CABG age 61   Thyroid disease Sister    Colon cancer Neg Hx    Esophageal cancer Neg Hx    Stomach cancer Neg Hx    Rectal cancer Neg Hx     Social History   Tobacco Use   Smoking status: Never   Smokeless tobacco: Never  Vaping Use   Vaping Use: Never used  Substance Use Topics   Alcohol use: Not Currently    Alcohol/week: 0.0 standard drinks    Comment: occ beer   Drug use: No    Home Medications Prior to Admission medications   Medication Sig Start Date End Date Taking? Authorizing Provider  albuterol (VENTOLIN HFA) 108 (90 Base) MCG/ACT inhaler Inhale 2 puffs into the lungs every 6 (six) hours as needed for wheezing or  shortness of breath. 07/29/20  Yes Caryl Ada K, PA-C  benzonatate (TESSALON PERLES) 100 MG capsule Take 1 capsule (100 mg total) by mouth every 6 (six) hours as needed for cough. 07/29/20 07/29/21 Yes Fransico Meadow, PA-C  azelastine (ASTELIN) 0.1 % nasal spray  05/06/18   [provider]  azelastine (OPTIVAR) 0.05 % ophthalmic solution Place 1 drop into both eyes daily.     [provider]  cholecalciferol (VITAMIN D) 1000 units tablet Take 2,000 Units by mouth daily.    [provider]  clindamycin (CLEOCIN) 300 MG capsule Take 1 capsule (300 mg total) by mouth 4 (four) times daily. X 7 days 06/15/20   Malvin Johns, MD  dicyclomine (BENTYL) 10 MG capsule Take 1 capsule (10 mg total) by mouth 3 (three) times daily as needed for spasms. 12/21/19   Pyrtle, Lajuan Lines, MD  EPINEPHrine 0.3 mg/0.3 mL IJ SOAJ injection Inject 0.3 mg into the muscle once.    [provider]  metoprolol tartrate (LOPRESSOR) 100 MG tablet Take 1 tablet (100 mg total) by mouth once for 1 dose. Take 1 tablet (100 mg total) 2 hours prior to CT scan 06/05/19 06/05/19  Sueanne Margarita, MD  pantoprazole (PROTONIX) 40 MG tablet Take 1 tablet (40 mg total) by mouth daily. 09/29/19   Irene Shipper, MD  SYNTHROID 88 MCG tablet Take 1 tablet (88 mcg total) by mouth daily before breakfast. 11/10/18   Philemon Kingdom, MD    Allergies    Augmentin [amoxicillin-pot clavulanate], Crestor [rosuvastatin], Demerol [meperidine], Latex, Meperidine hcl, Morphine and related, Pentazocine lactate, Simvastatin, Talwin [pentazocine], and Codeine  Review of Systems   Review of Systems  Constitutional:  Negative for chills.  Gastrointestinal:  Positive for abdominal pain. Negative for nausea.  All other systems reviewed and are negative.  Physical Exam Updated Vital Signs BP 128/69 (BP Location: Right Arm)   Pulse 85   Temp 98.5 F (36.9 C) (Oral)   Resp 18   Ht 5\' 2"  (1.575 m)   Wt 61.2 kg   SpO2 98%    BMI 24.69 kg/m   Physical Exam Vitals reviewed.  HENT:     Head: Normocephalic.  Cardiovascular:     Rate and Rhythm: Normal rate and regular rhythm.  Pulmonary:  Effort: Pulmonary effort is normal.  Abdominal:     General: Abdomen is flat.  Skin:    General: Skin is warm.  Neurological:     General: No focal deficit present.     Mental Status: She is alert.    ED Results / Procedures / Treatments   Labs (all labs ordered are listed, but only abnormal results are displayed) Labs Reviewed - No data to display  EKG EKG Interpretation  Date/Time:  Monday July 29 2020 14:37:14 EDT Ventricular Rate:  92 PR Interval:  143 QRS Duration: 74 QT Interval:  339 QTC Calculation: 420 R Axis:   71 Text Interpretation: Sinus rhythm Atrial premature complex Right atrial enlargement Minimal ST depression, diffuse leads ST depression mpre pronounced than prior 06-12-2022 Confirmed by Aletta Edouard [id number]) on 07/29/2020 2:41:27 PM  Radiology DG Chest Portable 1 View  Result Date: 07/29/2020 CLINICAL DATA:  Pt arrives pov with c/o headache, cough, generalized body aches, fever, sore throat and congestion. X 3 days. Pt endorses Positive home covid test, EXAM: PORTABLE CHEST 1 VIEW COMPARISON:  06/18/2020 FINDINGS: Atherosclerotic calcification along the descending thoracic aorta. The lungs appear clear. No blunting of the costophrenic angles. Heart size within normal limits. No significant bony abnormality observed. IMPRESSION: 1.  No active cardiopulmonary disease is radiographically apparent. 2.  Aortic Atherosclerosis (ICD10-I70.0). Electronically Signed   By: Van Clines M.D.   On: 07/29/2020 15:40    Procedures Procedures   Medications Ordered in ED Medications - No data to display  ED Course  I have reviewed the triage vital signs and the nursing notes.  Pertinent labs & imaging results that were available during my care of the patient were reviewed by me and considered in my  medical decision making (see chart for details).    MDM Rules/Calculators/A&P                          MDM:  Chest xray is normal.  Pt counseled on tylenol for fevers and bodyaches   Final Clinical Impression(s) / ED Diagnoses Final diagnoses:  COVID    Rx / DC Orders ED Discharge Orders          Ordered    albuterol (VENTOLIN HFA) 108 (90 Base) MCG/ACT inhaler  Every 6 hours PRN        07/29/20 1610    benzonatate (TESSALON PERLES) 100 MG capsule  Every 6 hours PRN        07/29/20 1610          An After Visit Summary was printed and given to the patient.    Sidney Ace 07/30/20 Silas Flood, April, MD 08/08/20 [id number]

## 2020-08-07 DIAGNOSIS — U071 COVID-19: Secondary | ICD-10-CM | POA: Diagnosis not present

## 2020-08-07 DIAGNOSIS — J209 Acute bronchitis, unspecified: Secondary | ICD-10-CM | POA: Diagnosis not present

## 2020-08-15 DIAGNOSIS — E538 Deficiency of other specified B group vitamins: Secondary | ICD-10-CM | POA: Diagnosis not present

## 2020-09-06 DIAGNOSIS — U099 Post covid-19 condition, unspecified: Secondary | ICD-10-CM | POA: Diagnosis not present

## 2020-09-06 DIAGNOSIS — H6983 Other specified disorders of Eustachian tube, bilateral: Secondary | ICD-10-CM | POA: Diagnosis not present

## 2020-09-06 DIAGNOSIS — R053 Chronic cough: Secondary | ICD-10-CM | POA: Diagnosis not present

## 2020-09-16 DIAGNOSIS — E538 Deficiency of other specified B group vitamins: Secondary | ICD-10-CM | POA: Diagnosis not present

## 2020-10-03 DIAGNOSIS — M542 Cervicalgia: Secondary | ICD-10-CM | POA: Diagnosis not present

## 2020-10-03 DIAGNOSIS — H9201 Otalgia, right ear: Secondary | ICD-10-CM | POA: Diagnosis not present

## 2020-10-04 DIAGNOSIS — E538 Deficiency of other specified B group vitamins: Secondary | ICD-10-CM | POA: Diagnosis not present

## 2020-10-04 DIAGNOSIS — Z0000 Encounter for general adult medical examination without abnormal findings: Secondary | ICD-10-CM | POA: Diagnosis not present

## 2020-10-18 DIAGNOSIS — E538 Deficiency of other specified B group vitamins: Secondary | ICD-10-CM | POA: Diagnosis not present

## 2020-11-13 DIAGNOSIS — Z23 Encounter for immunization: Secondary | ICD-10-CM | POA: Diagnosis not present

## 2020-11-20 ENCOUNTER — Encounter: Payer: Self-pay | Admitting: Physician Assistant

## 2020-11-20 ENCOUNTER — Ambulatory Visit (INDEPENDENT_AMBULATORY_CARE_PROVIDER_SITE_OTHER): Payer: Medicare Other | Admitting: Physician Assistant

## 2020-11-20 VITALS — BP 112/70 | HR 82 | Ht 62.5 in | Wt 132.4 lb

## 2020-11-20 DIAGNOSIS — R1319 Other dysphagia: Secondary | ICD-10-CM | POA: Diagnosis not present

## 2020-11-20 DIAGNOSIS — R1013 Epigastric pain: Secondary | ICD-10-CM | POA: Diagnosis not present

## 2020-11-20 DIAGNOSIS — K219 Gastro-esophageal reflux disease without esophagitis: Secondary | ICD-10-CM | POA: Diagnosis not present

## 2020-11-20 MED ORDER — ESOMEPRAZOLE MAGNESIUM 40 MG PO CPDR: 40.0000 mg | DELAYED_RELEASE_CAPSULE | Freq: Two times a day (BID) | ORAL | 2 refills | Status: DC

## 2020-11-20 NOTE — Patient Instructions (Signed)
We have sent the following medications to your pharmacy for you to pick up at your convenience: Nexium   You have been scheduled for an endoscopy. Please follow written instructions given to you at your visit today. If you use inhalers (even only as needed), please bring them with you on the day of your procedure.  If you are age 73 or older, your body mass index should be between 23-30. Your Body mass index is 23.83 kg/m. If this is out of the aforementioned range listed, please consider follow up with your Primary Care Provider.  If you are age 53 or younger, your body mass index should be between 19-25. Your Body mass index is 23.83 kg/m. If this is out of the aformentioned range listed, please consider follow up with your Primary Care Provider.   ________________________________________________________  The [facility] providers would like to encourage you to use [facility] to communicate with providers for non-urgent requests or questions.  Due to long hold times on the telephone, sending your provider a message by [facility] may be a faster and more efficient way to get a response.  Please allow 48 business hours for a response.  Please remember that this is for non-urgent requests.  _______________________________________________________  Thank you for choosing me and [facility].  Ellouise Newer PA

## 2020-11-20 NOTE — Progress Notes (Signed)
Chief Complaint: GERD  HPI:    Mrs. Mary Rubio is a 75 year old Caucasian female with a past medical history as listed below including prior cholecystectomy, reflux with associated esophageal eosinophilia and history of stricture with prior dilation and IBS-D, functional abdominal pain, known to Dr. Hilarie Fredrickson, who presents to clinic today with a complaint of reflux.    12/21/2019 patient seen in clinic by Dr. Hilarie Fredrickson.  That time was some reflux symptoms.  Also discussed dysphagia and was scheduled for repeat EGD and continued on Pantoprazole 40 twice a day.  Also had SIBO breath testing and Bentyl 10 mg 3 times daily was added for abdominal pain/bloating.  Noted that she had a complete colonoscopy in May 2016 with plans for repeat May 2026.  (Patient never had an EGD)    Today, the patient tells me that she continues with stomach problems.  Currently taking Pantoprazole 40 mg once daily but she does not ever feel like this really worked.  She gets pain in her epigastrium immediately after eating and then sometimes 6 hours later she can feel pain in her lower abdomen related to what she ate.  Tells me it just feels like "it is on fire in there", like there is just acid.  Also describes reflux.  She has trouble sleeping due to trouble laying flat and oftentimes has to raise the head of her bed or sit up in a chair.  Has also noticed some increase in dysphagia recently with foods feeling like they get stuck on the way down.  Also describes some what sounds like "esophageal spasm" with water, tells me oftentimes she will take a sip and things seem to close down and it will come right back out.  Tells me she continues with her chronic diarrhea which is typically once a day.  "I would rather this than constipation".  Reminds me of her eosinophilic esophagitis, apparently previously followed with an allergist in regards to shots but was told to discontinue these in 2019 due to [place].    Tells me she had a rough year  with the loss of her sister, having COVID and multiple other medical complaints.    Denies fever, chills, weight loss, blood in her stool or symptoms that awaken her from sleep.  Past Medical History:  Diagnosis Date   Allergy    Anxiety    Asthma    Colon polyps    COVID-19 07/2020   Depression    Diverticulosis    Environmental allergies    Weekly allergy shots   Eosinophilic esophagitis    Esophageal stricture    Esophagitis    GERD (gastroesophageal reflux disease)    Hiatal hernia    Hyperlipidemia    Hypothyroidism    IBS (irritable bowel syndrome)    Osteopenia    Osteoporosis    Skin cancer    Left arm Squamous cell 10/10/2018    Past Surgical History:  Procedure Laterality Date   ABDOMINAL HYSTERECTOMY     BARTHOLIN GLAND CYST EXCISION     BUNIONECTOMY Right    CHOLECYSTECTOMY     COLONOSCOPY  2016   ESOPHAGOGASTRODUODENOSCOPY N/A 10/21/2012   Procedure: ESOPHAGOGASTRODUODENOSCOPY (EGD);  Surgeon: Beryle Beams, MD;  Location: [facility];  Service: Endoscopy;  Laterality: N/A;   ESOPHAGOGASTRODUODENOSCOPY N/A 11/02/2017   Procedure: ESOPHAGOGASTRODUODENOSCOPY (EGD);  Surgeon: Jerene Bears, MD;  Location: [facility] ENDOSCOPY;  Service: Gastroenterology;  Laterality: N/A;   EXPLORATORY LAPAROTOMY     endometriosis   facial cyst  excision   NASAL SEPTUM SURGERY     SKIN CANCER EXCISION Left 10/10/2018   thumb surgery Left    for cyst    Current Outpatient Medications  Medication Sig Dispense Refill   albuterol (VENTOLIN HFA) 108 (90 Base) MCG/ACT inhaler Inhale 2 puffs into the lungs every 6 (six) hours as needed for wheezing or shortness of breath. 8 g 2   azelastine (ASTELIN) 0.1 % nasal spray      azelastine (OPTIVAR) 0.05 % ophthalmic solution Place 1 drop into both eyes daily.      cholecalciferol (VITAMIN D) 1000 units tablet Take 2,000 Units by mouth daily.     EPINEPHrine 0.3 mg/0.3 mL IJ SOAJ injection Inject 0.3 mg into the muscle once.      pantoprazole (PROTONIX) 40 MG tablet Take 1 tablet (40 mg total) by mouth daily. 90 tablet 3   SYNTHROID 88 MCG tablet Take 1 tablet (88 mcg total) by mouth daily before breakfast. 90 tablet 3   No current facility-administered medications for this visit.    Allergies as of 11/20/2020 - Review Complete 11/20/2020  Allergen Reaction Noted   Augmentin [amoxicillin-pot clavulanate] Other (See Comments) 01/03/2014   Crestor [rosuvastatin]  05/12/2012   Demerol [meperidine]  05/21/2014   Latex Other (See Comments) 04/27/2012   Meperidine hcl Other (See Comments)    Morphine and related Itching 05/12/2012   Pentazocine lactate Nausea And Vomiting    Simvastatin Other (See Comments)    Talwin [pentazocine]  05/21/2014   Codeine Rash     Family History  Problem Relation Age of Onset   Lung cancer Mother    Hypertension Mother    Thyroid disease Mother    Heart disease Mother    Skin cancer Father    Dementia Father    Arrhythmia Father    Heart disease Father        Several relatives on dad's side have had MIs/CABG   Alzheimer's disease Father    Heart disease Sister    Thyroid disease Daughter    Heart disease Maternal Grandfather    Heart disease Paternal 69    CAD Sister        MI/stent age 76, CABG age 35   Thyroid disease Sister    Colon cancer Neg Hx    Esophageal cancer Neg Hx    Stomach cancer Neg Hx    Rectal cancer Neg Hx     Social History   Socioeconomic History   Marital status: Single    Spouse name: Not on file   Number of children: 1   Years of education: college   Highest education level: Not on file  Occupational History   Occupation: caregiver    Employer: NOT EMPLOYED  Tobacco Use   Smoking status: Never   Smokeless tobacco: Never  Vaping Use   Vaping Use: Never used  Substance and Sexual Activity   Alcohol use: Not Currently    Alcohol/week: 0.0 standard drinks    Comment: occ beer   Drug use: No   Sexual activity: Never  Other  Topics Concern   Not on file  Social History Narrative   Not on file   Social Determinants of Health   Financial Resource Strain: Not on file  Food Insecurity: Not on file  Transportation Needs: Not on file  Physical Activity: Not on file  Stress: Not on file  Social Connections: Not on file  Intimate Partner Violence: Not on file    Review  of Systems:    Constitutional: No weight loss, fever or chills Cardiovascular: No chest pain Respiratory: No SOB Gastrointestinal: See HPI and otherwise negative   Physical Exam:  Vital signs: BP 112/70   Pulse 82   Ht 5' 2.5" (1.588 m)   Wt 132 lb 6 oz (60 kg)   BMI 23.83 kg/m    Constitutional:   Pleasant Caucasian female appears to be in NAD, Well developed, Well nourished, alert and cooperative Respiratory: Respirations even and unlabored. Lungs clear to auscultation bilaterally.   No wheezes, crackles, or rhonchi.  Cardiovascular: Normal S1, S2. No MRG. Regular rate and rhythm. No peripheral edema, cyanosis or pallor.  Gastrointestinal:  Soft, nondistended, moderate epigastric TTP, no rebound or guarding. Normal bowel sounds. No appreciable masses or hepatomegaly. Rectal:  Not performed.  Psychiatric: Oriented to person, place and time. Demonstrates good judgement and reason without abnormal affect or behaviors.  RELEVANT LABS AND IMAGING: CBC    Component Value Date/Time   WBC 5.0 06/18/2020 1300   RBC 4.46 06/18/2020 1300   HGB 14.1 06/18/2020 1300   HCT 40.3 06/18/2020 1300   PLT 206 06/18/2020 1300   MCV 90.4 06/18/2020 1300   MCV 92.0 08/14/2014 1752   MCH 31.6 06/18/2020 1300   MCHC 35.0 06/18/2020 1300   RDW 12.2 06/18/2020 1300   LYMPHSABS 1.4 06/18/2020 1300   MONOABS 0.4 06/18/2020 1300   EOSABS 0.1 06/18/2020 1300   BASOSABS 0.0 06/18/2020 1300    CMP     Component Value Date/Time   NA 137 06/18/2020 1300   K 3.7 06/18/2020 1300   CL 104 06/18/2020 1300   CO2 23 06/18/2020 1300   GLUCOSE 94  06/18/2020 1300   BUN 11 06/18/2020 1300   CREATININE 0.98 06/18/2020 1300   CREATININE 0.81 11/28/2014 1227   CALCIUM 9.2 06/18/2020 1300   PROT 7.1 09/27/2019 1721   ALBUMIN 4.2 09/27/2019 1721   AST 21 09/27/2019 1721   ALT 17 09/27/2019 1721   ALKPHOS 58 09/27/2019 1721   BILITOT 0.1 (L) 09/27/2019 1721   GFRNONAA >60 06/18/2020 1300   GFRNONAA 66 03/08/2013 1551   GFRAA 55 (L) 09/27/2019 1721   GFRAA 77 03/08/2013 1551    Assessment: 1.  Dysphagia: Increase in symptoms recently, patient reminds me of her history of EOE, but also describes a stricture in the past; consider stricture+/-EOE 2.  GERD: Increased symptoms, does not believe Pantoprazole 40 mg is working; consider possible gastroparesis worsening problems? 3.  Epigastric pain: With all the above  Plan: 1.  Scheduled patient for an EGD with dilation in the [place] with Dr. Hilarie Fredrickson.  Did provide patient with a detailed list of risks for the procedure and she agrees to proceed. Patient is appropriate for endoscopic procedure(s) in the ambulatory ([facility]) setting.  2.  Pending results from above could consider gastric emptying study +/- esophageal manometry given symptoms described today. 3.  Stop Pantoprazole.  Start Nexium 40 mg, will increase this to twice daily dosing.  30-60 minutes for breakfast and dinner.  #60 with 5 refills. 4.  Patient to follow in clinic per recommendations from Dr. Hilarie Fredrickson after time of procedure.  Ellouise Newer, PA-C [facility] 11/20/2020, 9:59 AM  Cc: Bernerd Limbo, MD

## 2020-11-21 NOTE — Progress Notes (Signed)
Addendum: Reviewed and agree with assessment and management plan. Keaunna Skipper M, MD  

## 2020-11-22 ENCOUNTER — Telehealth: Payer: Self-pay | Admitting: *Deleted

## 2020-11-22 DIAGNOSIS — H903 Sensorineural hearing loss, bilateral: Secondary | ICD-10-CM | POA: Diagnosis not present

## 2020-11-22 DIAGNOSIS — H838X3 Other specified diseases of inner ear, bilateral: Secondary | ICD-10-CM | POA: Diagnosis not present

## 2020-11-22 DIAGNOSIS — G501 Atypical facial pain: Secondary | ICD-10-CM | POA: Diagnosis not present

## 2020-11-22 NOTE — Telephone Encounter (Signed)
PA submitted and approved via Covermymeds for Nexium. Faxed approval to patient's pharmacy.

## 2020-12-20 ENCOUNTER — Other Ambulatory Visit: Payer: Self-pay

## 2020-12-20 ENCOUNTER — Ambulatory Visit (AMBULATORY_SURGERY_CENTER): Payer: Medicare Other | Admitting: Internal Medicine

## 2020-12-20 ENCOUNTER — Encounter: Payer: Self-pay | Admitting: Internal Medicine

## 2020-12-20 VITALS — BP 131/72 | HR 90 | Temp 98.5°F | Resp 16 | Ht 62.0 in | Wt 132.0 lb

## 2020-12-20 DIAGNOSIS — K222 Esophageal obstruction: Secondary | ICD-10-CM | POA: Diagnosis not present

## 2020-12-20 DIAGNOSIS — K449 Diaphragmatic hernia without obstruction or gangrene: Secondary | ICD-10-CM

## 2020-12-20 DIAGNOSIS — R1314 Dysphagia, pharyngoesophageal phase: Secondary | ICD-10-CM | POA: Diagnosis not present

## 2020-12-20 DIAGNOSIS — R1310 Dysphagia, unspecified: Secondary | ICD-10-CM | POA: Diagnosis not present

## 2020-12-20 DIAGNOSIS — K219 Gastro-esophageal reflux disease without esophagitis: Secondary | ICD-10-CM | POA: Diagnosis not present

## 2020-12-20 DIAGNOSIS — R1319 Other dysphagia: Secondary | ICD-10-CM | POA: Diagnosis not present

## 2020-12-20 MED ORDER — SODIUM CHLORIDE 0.9 % IV SOLN: 500.0000 mL | Freq: Once | INTRAVENOUS | Status: DC

## 2020-12-20 NOTE — Progress Notes (Signed)
Vitals-CW  History reviewed. 

## 2020-12-20 NOTE — Progress Notes (Signed)
Transported to PACU, VSS 

## 2020-12-20 NOTE — Op Note (Signed)
[facility] Patient Name: Mary Rubio Procedure Date: 12/20/2020 10:17 AM MRN: [id number] Endoscopist: Jerene Bears , MD Age: 75 Referring MD:  Date of Birth: 07/11/1945 Gender: Female Account #: [id number] Procedure:                Upper GI endoscopy Indications:              Epigastric abdominal pain, Dysphagia,                            Gastro-esophageal reflux disease, history of EoE                            (versus reflux related eosinophilia), PPI changed                            to Nexium recently but patient reports not taking                            daily Medicines:                Monitored Anesthesia Care Procedure:                Pre-Anesthesia Assessment:                           - Prior to the procedure, a History and Physical                            was performed, and patient medications and                            allergies were reviewed. The patient's tolerance of                            previous anesthesia was also reviewed. The risks                            and benefits of the procedure and the sedation                            options and risks were discussed with the patient.                            All questions were answered, and informed consent                            was obtained. Prior Anticoagulants: The patient has                            taken no previous anticoagulant or antiplatelet                            agents. ASA Grade Assessment: II - A patient with  mild systemic disease. After reviewing the risks                            and benefits, the patient was deemed in                            satisfactory condition to undergo the procedure.                           After obtaining informed consent, the endoscope was                            passed under direct vision. Throughout the                            procedure, the patient's blood pressure, pulse, and                             oxygen saturations were monitored continuously. The                            GIF [id number] #[id number] was introduced through the                            mouth, and advanced to the second part of duodenum.                            The upper GI endoscopy was accomplished without                            difficulty. The patient tolerated the procedure                            well. Scope In: Scope Out: Findings:                 Mucosal changes including ringed esophagus,                            longitudinal furrows and circumferential folds were                            found in the entire esophagus. Biopsies were                            obtained from the proximal and distal esophagus                            with cold forceps for histology of suspected                            eosinophilic esophagitis.                           One benign-appearing, intrinsic moderate                            (  circumferential scarring or stenosis; an endoscope                            may pass) stenosis was found 37 cm from the                            incisors (at [facility]). This stenosis measured 1.2 cm                            (inner diameter) x less than one cm (in length).                            The stenosis was traversed. A TTS dilator was                            passed through the scope. Dilation with a 16-17-18                            mm balloon dilator was performed to 17 mm. The                            dilation site was examined and showed moderate                            mucosal disruption.                           A 3 cm hiatal hernia was present.                           The entire examined stomach was normal.                           The examined duodenum was normal. Complications:            No immediate complications. Estimated Blood Loss:     Estimated blood loss was minimal. Impression:               - Esophageal mucosal changes  suggestive of                            eosinophilic esophagitis. Biopsied.                           - Benign-appearing esophageal stenosis. Dilated to                            17 mm.                           - 3 cm hiatal hernia.                           - Normal stomach.                           -  Normal examined duodenum. Recommendation:           - Patient has a contact number available for                            emergencies. The signs and symptoms of potential                            delayed complications were discussed with the                            patient. Return to normal activities tomorrow.                            Written discharge instructions were provided to the                            patient.                           - Resume previous diet.                           - Continue present medications. Please use the                            Nexium 40 mg twice daily (at least once daily).                            Will likely add swallowed fluticasone or budesonide                            after pathology results reviewed).                           - Await pathology results.                           - Office follow-up in 8-12 weeks. Jerene Bears, MD 12/20/2020 10:51:53 AM This report has been signed electronically.

## 2020-12-20 NOTE — Patient Instructions (Addendum)
YOU HAD AN ENDOSCOPIC PROCEDURE TODAY AT [facility]:   Refer to the procedure report that was given to you for any specific questions about what was found during the examination.  If the procedure report does not answer your questions, please call your gastroenterologist to clarify.  If you requested that your care partner not be given the details of your procedure findings, then the procedure report has been included in a sealed envelope for you to review at your convenience later.  YOU SHOULD EXPECT: Some feelings of bloating in the abdomen. Passage of more gas than usual.  Walking can help get rid of the air that was put into your GI tract during the procedure and reduce the bloating. If you had a lower endoscopy (such as a colonoscopy or flexible sigmoidoscopy) you may notice spotting of blood in your stool or on the toilet paper. If you underwent a bowel prep for your procedure, you may not have a normal bowel movement for a few days.  Please Note:  You might notice some irritation and congestion in your nose or some drainage.  This is from the oxygen used during your procedure.  There is no need for concern and it should clear up in a day or so.  SYMPTOMS TO REPORT IMMEDIATELY:  Following upper endoscopy (EGD)  Vomiting of blood or coffee ground material  New chest pain or pain under the shoulder blades  Painful or persistently difficult swallowing  New shortness of breath  Fever of 100F or higher  Black, tarry-looking stools  For urgent or emergent issues, a gastroenterologist can be reached at any hour by calling [phone]. Do not use MyChart messaging for urgent concerns.    DIET:  Follow a Post-dilation diet (see handout given to you by your recovery nurse): Nothing by mouth until 1200 pm, Clear Liquids only from 12:00 pm to 2:00 pm, then you may progress to a Soft Diet at 2:00pm for the rest of the day today. You may proceed to your regular diet as tolerated  tomorrow morning.  Drink plenty of fluids but you should avoid alcoholic beverages for 24 hours.  MEDICATIONS: Continue present medications. Please use NEXIUM 40 MG TWICE DAILY (AT LEAST ONCE DAILY).   Follow Up with Dr. Hilarie Fredrickson in his office in 8-12 weeks. We have made an appointment for you on Wednesday, February 09, 2021 at 2:10pm.  Please see handouts given to you by your recovery nurse.  Thank you for allowing [place] to provide for your healthcare needs today.  ACTIVITY:  You should plan to take it easy for the rest of today and you should NOT DRIVE or use heavy machinery until tomorrow (because of the sedation medicines used during the test).    FOLLOW UP: Our staff will call the number listed on your records 48-72 hours following your procedure to check on you and address any questions or concerns that you may have regarding the information given to you following your procedure. If we do not reach you, we will leave a message.  We will attempt to reach you two times.  During this call, we will ask if you have developed any symptoms of COVID 19. If you develop any symptoms (ie: fever, flu-like symptoms, shortness of breath, cough etc.) before then, please call [phone].  If you test positive for Covid 19 in the 2 weeks post procedure, please call and report this information to [place].    If any biopsies were taken you  will be contacted by phone or by letter within the next 1-3 weeks.  Please call us at [phone] if you have not heard about the biopsies in 3 weeks.    SIGNATURES/CONFIDENTIALITY: You and/or your care partner have signed paperwork which will be entered into your electronic medical record.  These signatures attest to the fact that that the information above on your After Visit Summary has been reviewed and is understood.  Full responsibility of the confidentiality of this discharge information lies with you and/or your care-partner.

## 2020-12-20 NOTE — Progress Notes (Signed)
Called to room to assist during endoscopic procedure.  Patient ID and intended procedure confirmed with present staff. Received instructions for my participation in the procedure from the performing physician.  

## 2020-12-20 NOTE — Progress Notes (Signed)
Patient presents for upper endoscopy with probable dilation to evaluate history of GERD, eosinophilia associated with GERD and dysphagia.  See office note from 11/20/2020 by Ellouise Newer, PA-C for details  Patient remains appropriate for outpatient upper endoscopy in the [facility] today.  No significant changes in symptoms or medical history since that office visit

## 2020-12-24 ENCOUNTER — Telehealth: Payer: Self-pay

## 2020-12-24 NOTE — Telephone Encounter (Signed)
  Follow up Call-  Call back number 12/20/2020 09/19/2018  Post procedure Call Back phone  # [phone]  Permission to leave phone message Yes Yes  Some recent data might be hidden     Patient questions:  Do you have a fever, pain , No.or abdominal swelling? No. Pain Score  0 *  Have you tolerated food without any problems? Yes.    Have you been able to return to your normal activities? Yes.    Do you have any questions about your discharge instructions: Diet   No. Medications  No. Follow up visit  No.  Do you have questions or concerns about your Care? No.  Actions: * If pain score is 4 or above: No action needed, pain <4.  Have you developed a fever since your procedure? no  2.   Have you had an respiratory symptoms (SOB or cough) since your procedure? no  3.   Have you tested positive for COVID 19 since your procedure no  4.   Have you had any family members/close contacts diagnosed with the COVID 19 since your procedure?  no   If yes to any of these questions please route to Joylene John, RN and Joella Prince, RN

## 2020-12-24 NOTE — Telephone Encounter (Signed)
  Follow up Call-  Call back number 12/20/2020 09/19/2018  Post procedure Call Back phone  # [phone]  Permission to leave phone message Yes Yes  Some recent data might be hidden     F/u call attempted, no answer, left msg

## 2020-12-30 DIAGNOSIS — E538 Deficiency of other specified B group vitamins: Secondary | ICD-10-CM | POA: Diagnosis not present

## 2020-12-30 DIAGNOSIS — R0683 Snoring: Secondary | ICD-10-CM | POA: Diagnosis not present

## 2020-12-30 DIAGNOSIS — E559 Vitamin D deficiency, unspecified: Secondary | ICD-10-CM | POA: Diagnosis not present

## 2020-12-30 DIAGNOSIS — E039 Hypothyroidism, unspecified: Secondary | ICD-10-CM | POA: Diagnosis not present

## 2020-12-30 DIAGNOSIS — J301 Allergic rhinitis due to pollen: Secondary | ICD-10-CM | POA: Diagnosis not present

## 2020-12-30 DIAGNOSIS — E785 Hyperlipidemia, unspecified: Secondary | ICD-10-CM | POA: Diagnosis not present

## 2021-01-14 DIAGNOSIS — D225 Melanocytic nevi of trunk: Secondary | ICD-10-CM | POA: Diagnosis not present

## 2021-01-14 DIAGNOSIS — L820 Inflamed seborrheic keratosis: Secondary | ICD-10-CM | POA: Diagnosis not present

## 2021-01-14 DIAGNOSIS — L821 Other seborrheic keratosis: Secondary | ICD-10-CM | POA: Diagnosis not present

## 2021-02-24 DIAGNOSIS — Z1389 Encounter for screening for other disorder: Secondary | ICD-10-CM | POA: Diagnosis not present

## 2021-02-24 DIAGNOSIS — M2550 Pain in unspecified joint: Secondary | ICD-10-CM | POA: Diagnosis not present

## 2021-02-24 DIAGNOSIS — M8588 Other specified disorders of bone density and structure, other site: Secondary | ICD-10-CM | POA: Diagnosis not present

## 2021-02-24 DIAGNOSIS — E039 Hypothyroidism, unspecified: Secondary | ICD-10-CM | POA: Diagnosis not present

## 2021-02-24 DIAGNOSIS — M79609 Pain in unspecified limb: Secondary | ICD-10-CM | POA: Diagnosis not present

## 2021-02-24 DIAGNOSIS — E7800 Pure hypercholesterolemia, unspecified: Secondary | ICD-10-CM | POA: Diagnosis not present

## 2021-03-12 ENCOUNTER — Ambulatory Visit (INDEPENDENT_AMBULATORY_CARE_PROVIDER_SITE_OTHER): Payer: Medicare Other | Admitting: Internal Medicine

## 2021-03-12 ENCOUNTER — Encounter: Payer: Self-pay | Admitting: Internal Medicine

## 2021-03-12 ENCOUNTER — Other Ambulatory Visit (INDEPENDENT_AMBULATORY_CARE_PROVIDER_SITE_OTHER): Payer: Medicare Other

## 2021-03-12 VITALS — BP 124/70 | HR 85 | Ht 62.5 in | Wt 134.5 lb

## 2021-03-12 DIAGNOSIS — Z79899 Other long term (current) drug therapy: Secondary | ICD-10-CM

## 2021-03-12 DIAGNOSIS — K200 Eosinophilic esophagitis: Secondary | ICD-10-CM

## 2021-03-12 DIAGNOSIS — K219 Gastro-esophageal reflux disease without esophagitis: Secondary | ICD-10-CM

## 2021-03-12 DIAGNOSIS — R195 Other fecal abnormalities: Secondary | ICD-10-CM | POA: Diagnosis not present

## 2021-03-12 LAB — MAGNESIUM: Magnesium: 1.9 mg/dL (ref 1.5–2.5)

## 2021-03-12 NOTE — Progress Notes (Signed)
° °  Subjective:    Patient ID: Mary Rubio, female    DOB: 10-29-1945, 76 y.o.   MRN: [id number]  HPI Mary Rubio is a 76 year old female with a history of GERD previously with associated esophageal eosinophilia, history of esophageal stricture with previous dilation, functional abdominal pain, IBS D who is here for follow-up.  She was last here on 12/20/2020 for EGD.  She also has a history of hyperlipidemia and hypothyroidism.  EGD was performed on 12/20/2020.  There were mucosal changes that appeared like EoE but biopsies were negative for elevated coastal eosinophils.  Esophageal dilation was performed in the distal esophagus across the GEJ to 17 mm with moderate mucosal disruption.  A 3 cm hiatal hernia was seen.  Remainder the exam unremarkable.  After endoscopy I recommended that she be more adherent with her PPI which she had been sporadic with.  She reports that she is considerably better.  Her epigastric pain and heartburn/pyrosis and burping is gone.  She has been adherent to Nexium 40 mg at least once a day occasionally twice a day.  Dysphagia symptom is better but occasionally food transit slowly into the stomach.  She reports near constant drainage from her nose.  She is thinking about making followed up with Dr. Harold Hedge for her allergies and nasal drainage.  She has had some loose stools of late but no diarrhea.  No blood in stool or melena.  Review of Systems As per HPI, otherwise negative  Current Medications, Allergies, Past Medical History, Past Surgical History, Family History and Social History were reviewed in [employer] record.    Objective:   Physical Exam Ht 5' 2.5" (1.588 m)    Wt 134 lb 8 oz (61 kg)    BMI 24.21 kg/m  Gen: awake, alert, NAD HEENT: anicteric  Neuro: nonfocal     Assessment & Plan:  76 year old female with a history of GERD with associated esophageal eosinophilia, history of esophageal stricture with  previous dilation, functional abdominal pain, IBS D who is here for follow-up.    GERD with esophageal stricture/hiatal hernia/prior dysphagia/high suspicion for EoE in remission on PPI--significant improvement in GERD and esophageal dysphagia symptoms with adherence to PPI. --Continue Nexium 40 mg twice daily before meals --Check magnesium today with chronic PPI   2.  CRC screening --complete and normal colonoscopy in May 2016, repeat recommended around May 2026  3.  Loose stools --possibly a side effect from Nexium but Nexium certainly worth this side effect.  No evidence for infectious diarrhea.  Trial of Metamucil 2 teaspoons daily  41-month follow-up, sooner if needed  30 minutes total spent today including patient facing time, coordination of care, reviewing medical history/procedures/pertinent radiology studies, and documentation of the encounter.

## 2021-03-12 NOTE — Patient Instructions (Addendum)
Continue Nexium 40 mg twice daily  Take Metamucil 2 tsp daily  If you are age 74 or older, your body mass index should be between 23-30. Your Body mass index is 24.21 kg/m. If this is out of the aforementioned range listed, please consider follow up with your Primary Care Provider.  If you are age 18 or younger, your body mass index should be between 19-25. Your Body mass index is 24.21 kg/m. If this is out of the aformentioned range listed, please consider follow up with your Primary Care Provider.   ________________________________________________________  The [place] [facility] providers would like to encourage you to use [facility] to communicate with providers for non-urgent requests or questions.  Due to long hold times on the telephone, sending your provider a message by [employer] may be a faster and more efficient way to get a response.  Please allow 48 business hours for a response.  Please remember that this is for non-urgent requests.  _______________________________________________________   I appreciate the  opportunity to care for you  Thank You   Ulice Dash Pyrtle,MD

## 2021-03-14 DIAGNOSIS — R0602 Shortness of breath: Secondary | ICD-10-CM | POA: Diagnosis not present

## 2021-03-14 DIAGNOSIS — J301 Allergic rhinitis due to pollen: Secondary | ICD-10-CM | POA: Diagnosis not present

## 2021-03-14 DIAGNOSIS — J3089 Other allergic rhinitis: Secondary | ICD-10-CM | POA: Diagnosis not present

## 2021-03-14 DIAGNOSIS — K200 Eosinophilic esophagitis: Secondary | ICD-10-CM | POA: Diagnosis not present

## 2021-03-14 DIAGNOSIS — H1045 Other chronic allergic conjunctivitis: Secondary | ICD-10-CM | POA: Diagnosis not present

## 2021-04-17 DIAGNOSIS — K200 Eosinophilic esophagitis: Secondary | ICD-10-CM | POA: Diagnosis not present

## 2021-04-17 DIAGNOSIS — Z9109 Other allergy status, other than to drugs and biological substances: Secondary | ICD-10-CM | POA: Diagnosis not present

## 2021-04-17 DIAGNOSIS — Z20822 Contact with and (suspected) exposure to covid-19: Secondary | ICD-10-CM | POA: Diagnosis not present

## 2021-04-17 DIAGNOSIS — J029 Acute pharyngitis, unspecified: Secondary | ICD-10-CM | POA: Diagnosis not present

## 2021-04-17 DIAGNOSIS — R0982 Postnasal drip: Secondary | ICD-10-CM | POA: Diagnosis not present

## 2021-04-17 DIAGNOSIS — K219 Gastro-esophageal reflux disease without esophagitis: Secondary | ICD-10-CM | POA: Diagnosis not present

## 2021-04-21 DIAGNOSIS — J45909 Unspecified asthma, uncomplicated: Secondary | ICD-10-CM | POA: Diagnosis not present

## 2021-04-21 DIAGNOSIS — J329 Chronic sinusitis, unspecified: Secondary | ICD-10-CM | POA: Diagnosis not present

## 2021-04-30 DIAGNOSIS — J0100 Acute maxillary sinusitis, unspecified: Secondary | ICD-10-CM | POA: Diagnosis not present

## 2021-04-30 DIAGNOSIS — R051 Acute cough: Secondary | ICD-10-CM | POA: Diagnosis not present

## 2021-05-07 DIAGNOSIS — J343 Hypertrophy of nasal turbinates: Secondary | ICD-10-CM | POA: Diagnosis not present

## 2021-05-07 DIAGNOSIS — J310 Chronic rhinitis: Secondary | ICD-10-CM | POA: Diagnosis not present

## 2021-05-08 ENCOUNTER — Other Ambulatory Visit: Payer: Self-pay | Admitting: Otolaryngology

## 2021-05-08 DIAGNOSIS — J329 Chronic sinusitis, unspecified: Secondary | ICD-10-CM

## 2021-05-14 DIAGNOSIS — Z1231 Encounter for screening mammogram for malignant neoplasm of breast: Secondary | ICD-10-CM | POA: Diagnosis not present

## 2021-05-22 ENCOUNTER — Ambulatory Visit
Admission: RE | Admit: 2021-05-22 | Discharge: 2021-05-22 | Disposition: A | Discharge status: Home / Self Care | Payer: Medicare Other | Source: Ambulatory Visit | Attending: Otolaryngology | Admitting: Otolaryngology

## 2021-05-22 DIAGNOSIS — J329 Chronic sinusitis, unspecified: Secondary | ICD-10-CM | POA: Diagnosis not present

## 2021-05-22 DIAGNOSIS — J3489 Other specified disorders of nose and nasal sinuses: Secondary | ICD-10-CM | POA: Diagnosis not present

## 2021-05-22 DIAGNOSIS — H9319 Tinnitus, unspecified ear: Secondary | ICD-10-CM | POA: Diagnosis not present

## 2021-06-10 DIAGNOSIS — E039 Hypothyroidism, unspecified: Secondary | ICD-10-CM | POA: Diagnosis not present

## 2021-06-11 DIAGNOSIS — J342 Deviated nasal septum: Secondary | ICD-10-CM | POA: Diagnosis not present

## 2021-06-11 DIAGNOSIS — J310 Chronic rhinitis: Secondary | ICD-10-CM | POA: Diagnosis not present

## 2021-06-11 DIAGNOSIS — J343 Hypertrophy of nasal turbinates: Secondary | ICD-10-CM | POA: Diagnosis not present

## 2021-06-29 ENCOUNTER — Other Ambulatory Visit: Payer: Self-pay | Admitting: Physician Assistant

## 2021-07-16 DIAGNOSIS — H35373 Puckering of macula, bilateral: Secondary | ICD-10-CM | POA: Diagnosis not present

## 2021-07-16 DIAGNOSIS — H40013 Open angle with borderline findings, low risk, bilateral: Secondary | ICD-10-CM | POA: Diagnosis not present

## 2021-07-16 DIAGNOSIS — H2513 Age-related nuclear cataract, bilateral: Secondary | ICD-10-CM | POA: Diagnosis not present

## 2021-08-05 DIAGNOSIS — F321 Major depressive disorder, single episode, moderate: Secondary | ICD-10-CM | POA: Diagnosis not present

## 2021-08-05 DIAGNOSIS — M2550 Pain in unspecified joint: Secondary | ICD-10-CM | POA: Diagnosis not present

## 2021-08-05 DIAGNOSIS — M8588 Other specified disorders of bone density and structure, other site: Secondary | ICD-10-CM | POA: Diagnosis not present

## 2021-08-05 DIAGNOSIS — E559 Vitamin D deficiency, unspecified: Secondary | ICD-10-CM | POA: Diagnosis not present

## 2021-08-05 DIAGNOSIS — F4310 Post-traumatic stress disorder, unspecified: Secondary | ICD-10-CM | POA: Diagnosis not present

## 2021-08-05 DIAGNOSIS — R5382 Chronic fatigue, unspecified: Secondary | ICD-10-CM | POA: Diagnosis not present

## 2021-08-05 DIAGNOSIS — E039 Hypothyroidism, unspecified: Secondary | ICD-10-CM | POA: Diagnosis not present

## 2021-08-11 DIAGNOSIS — Z780 Asymptomatic menopausal state: Secondary | ICD-10-CM | POA: Diagnosis not present

## 2021-08-11 DIAGNOSIS — M8589 Other specified disorders of bone density and structure, multiple sites: Secondary | ICD-10-CM | POA: Diagnosis not present

## 2021-08-27 ENCOUNTER — Ambulatory Visit (INDEPENDENT_AMBULATORY_CARE_PROVIDER_SITE_OTHER): Payer: Medicare Other | Admitting: Internal Medicine

## 2021-08-27 ENCOUNTER — Other Ambulatory Visit: Payer: Medicare Other

## 2021-08-27 ENCOUNTER — Encounter: Payer: Self-pay | Admitting: Internal Medicine

## 2021-08-27 VITALS — BP 126/74 | HR 80 | Ht 62.5 in | Wt 137.2 lb

## 2021-08-27 DIAGNOSIS — K449 Diaphragmatic hernia without obstruction or gangrene: Secondary | ICD-10-CM

## 2021-08-27 DIAGNOSIS — K219 Gastro-esophageal reflux disease without esophagitis: Secondary | ICD-10-CM | POA: Diagnosis not present

## 2021-08-27 DIAGNOSIS — R195 Other fecal abnormalities: Secondary | ICD-10-CM

## 2021-08-27 DIAGNOSIS — R221 Localized swelling, mass and lump, neck: Secondary | ICD-10-CM

## 2021-08-27 DIAGNOSIS — K200 Eosinophilic esophagitis: Secondary | ICD-10-CM

## 2021-08-27 MED ORDER — ESOMEPRAZOLE MAGNESIUM 40 MG PO CPDR: DELAYED_RELEASE_CAPSULE | ORAL | 11 refills | Status: DC

## 2021-08-27 NOTE — Patient Instructions (Signed)
Your provider has requested that you go to the basement level for lab work before leaving today. Press "B" on the elevator. The lab is located at the first door on the left as you exit the elevator.  We have sent the following medications to your pharmacy for you to pick up at your convenience: Necium 40 mg twice daily.   You can continue Gas-x as needed.  Please follow up with Dr. Hilarie Fredrickson in 6 months.   The [place] [facility] providers would like to encourage you to use [facility] to communicate with providers for non-urgent requests or questions.  Due to long hold times on the telephone, sending your provider a message by [facility] may be a faster and more efficient way to get a response.  Please allow 48 business hours for a response.  Please remember that this is for non-urgent requests.   Due to recent changes in healthcare laws, you may see the results of your imaging and laboratory studies on MyChart before your provider has had a chance to review them.  We understand that in some cases there may be results that are confusing or concerning to you. Not all laboratory results come back in the same time frame and the provider may be waiting for multiple results in order to interpret others.  Please give [place] 48 hours in order for your provider to thoroughly review all the results before contacting the office for clarification of your results.

## 2021-08-27 NOTE — Progress Notes (Signed)
   Subjective:    Patient ID: Mary Rubio, female    DOB: 1945-04-29, 76 y.o.   MRN: [id number]  HPI Mary Rubio is a 76 year old female with a history of GERD previously associated with esophageal eosinophilia/EoE, history of esophageal stricture with prior dilation, history of functional abdominal pain and IBS-D who is here for follow-up.  She was last seen in February 2023 and she is here alone today.  She reports that overall she has been feeling well.  Her reflux has been well-controlled and she has not had dysphagia symptoms.  She has decreased her Nexium on some days to once daily.  She does not eat 3 meals a day and often does not eat dinner which is why she does not always take the second Nexium.  She does notice some discomfort around the epigastrium and substernal with movement and she wonders if this is her hiatal hernia.  She will occasionally wake up in the morning with stomach burning pain.  She did not try the higher fiber for loose stools.  She notices loose stools if she drinks sweet tea or more than 1 cup of coffee.  This is not a life limiting symptom.  No blood in stool or melena.  She has tried some Gas-X for abdominal gas pain which helps.  She has noticed with foods like beef that she has swelling in her neck but no trouble breathing.   Review of Systems As per HPI, otherwise negative  Current Medications, Allergies, Past Medical History, Past Surgical History, Family History and Social History were reviewed in [employer] record.    Objective:   Physical Exam BP 126/74   Pulse 80   Ht 5' 2.5" (1.588 m)   Wt 137 lb 4 oz (62.3 kg)   BMI 24.70 kg/m  Gen: awake, alert, NAD HEENT: anicteric, op clear CV: RRR, no mrg Pulm: CTA b/l Abd: soft, NT/ND, +BS throughout Ext: no c/c/e Neuro: nonfocal       Assessment & Plan:  76 year old female with a history of GERD previously associated with esophageal eosinophilia/EoE, history of  esophageal stricture with prior dilation, history of functional abdominal pain and IBS-D who is here for follow-up.   GERD with esophageal stricture/hiatal hernia/esophageal eosinophilia and EoE responsive to PPI --symptoms are significantly improved at present with Nexium 40 mg at least daily.  Occasionally twice daily.  Unclear if her substernal discomfort with movement is related to GERD, hiatal hernia or non-GI musculoskeletal pain.  I suggested she try to determine if this symptom is better when she uses twice daily PPI --Nexium 40 mg before meals, once to twice daily --No repeat upper endoscopy recommended at this time due to improvement in symptoms  2.  Loose stools --irritable bowel in nature.  Not currently an issue though she can still try the Metamucil 2 teaspoons daily if this becomes more of a hindrance.  3.  Throat swelling symptom --related to beef.  Concerning symptom and I asked that she follow-up with Dr. Harold Hedge to exclude allergy. --Avoid beef --Alpha-gal panel today  4.  Colon cancer screening --normal colonoscopy May 2016, repeat recommended around May 2026  41-monthfollow-up  30 minutes total spent today including patient facing time, coordination of care, reviewing medical history/procedures/pertinent radiology studies, and documentation of the encounter.

## 2021-08-29 DIAGNOSIS — W57XXXA Bitten or stung by nonvenomous insect and other nonvenomous arthropods, initial encounter: Secondary | ICD-10-CM | POA: Diagnosis not present

## 2021-08-29 DIAGNOSIS — S20361A Insect bite (nonvenomous) of right front wall of thorax, initial encounter: Secondary | ICD-10-CM | POA: Diagnosis not present

## 2021-08-30 DIAGNOSIS — B029 Zoster without complications: Secondary | ICD-10-CM | POA: Diagnosis not present

## 2021-08-30 LAB — ALPHA-GAL PANEL
Allergen, Mutton, f88: <0.1 kU/L
Allergen, Pork, f26: <0.1 kU/L
Beef: <0.1 kU/L
CLASS: 0
CLASS: 0
Class: 0
GALACTOSE-ALPHA-1,3-GALACTOSE IGE*: <0.1 kU/L (ref <0.10)

## 2021-08-30 LAB — INTERPRETATION:

## 2021-10-02 DIAGNOSIS — K200 Eosinophilic esophagitis: Secondary | ICD-10-CM | POA: Diagnosis not present

## 2021-10-02 DIAGNOSIS — J3089 Other allergic rhinitis: Secondary | ICD-10-CM | POA: Diagnosis not present

## 2021-10-02 DIAGNOSIS — J301 Allergic rhinitis due to pollen: Secondary | ICD-10-CM | POA: Diagnosis not present

## 2021-10-02 DIAGNOSIS — R0602 Shortness of breath: Secondary | ICD-10-CM | POA: Diagnosis not present

## 2021-10-02 DIAGNOSIS — H1045 Other chronic allergic conjunctivitis: Secondary | ICD-10-CM | POA: Diagnosis not present

## 2021-10-09 DIAGNOSIS — F341 Dysthymic disorder: Secondary | ICD-10-CM | POA: Diagnosis not present

## 2021-10-09 DIAGNOSIS — Z79899 Other long term (current) drug therapy: Secondary | ICD-10-CM | POA: Diagnosis not present

## 2021-10-27 DIAGNOSIS — E039 Hypothyroidism, unspecified: Secondary | ICD-10-CM | POA: Diagnosis not present

## 2021-10-27 DIAGNOSIS — Z0000 Encounter for general adult medical examination without abnormal findings: Secondary | ICD-10-CM | POA: Diagnosis not present

## 2021-10-27 DIAGNOSIS — Z136 Encounter for screening for cardiovascular disorders: Secondary | ICD-10-CM | POA: Diagnosis not present

## 2021-10-28 DIAGNOSIS — E7800 Pure hypercholesterolemia, unspecified: Secondary | ICD-10-CM | POA: Diagnosis not present

## 2021-10-28 DIAGNOSIS — F321 Major depressive disorder, single episode, moderate: Secondary | ICD-10-CM | POA: Diagnosis not present

## 2021-10-28 DIAGNOSIS — Z1331 Encounter for screening for depression: Secondary | ICD-10-CM | POA: Diagnosis not present

## 2021-10-28 DIAGNOSIS — I700 Atherosclerosis of aorta: Secondary | ICD-10-CM | POA: Diagnosis not present

## 2021-10-28 DIAGNOSIS — Z23 Encounter for immunization: Secondary | ICD-10-CM | POA: Diagnosis not present

## 2021-10-28 DIAGNOSIS — E039 Hypothyroidism, unspecified: Secondary | ICD-10-CM | POA: Diagnosis not present

## 2021-10-28 DIAGNOSIS — K219 Gastro-esophageal reflux disease without esophagitis: Secondary | ICD-10-CM | POA: Diagnosis not present

## 2021-10-28 DIAGNOSIS — M791 Myalgia, unspecified site: Secondary | ICD-10-CM | POA: Diagnosis not present

## 2021-10-28 DIAGNOSIS — F4310 Post-traumatic stress disorder, unspecified: Secondary | ICD-10-CM | POA: Diagnosis not present

## 2021-10-28 DIAGNOSIS — Z0000 Encounter for general adult medical examination without abnormal findings: Secondary | ICD-10-CM | POA: Diagnosis not present

## 2021-10-30 DIAGNOSIS — M5459 Other low back pain: Secondary | ICD-10-CM | POA: Diagnosis not present

## 2021-10-30 DIAGNOSIS — M25552 Pain in left hip: Secondary | ICD-10-CM | POA: Diagnosis not present

## 2021-11-10 DIAGNOSIS — F341 Dysthymic disorder: Secondary | ICD-10-CM | POA: Diagnosis not present

## 2021-11-15 DIAGNOSIS — M25552 Pain in left hip: Secondary | ICD-10-CM | POA: Diagnosis not present

## 2021-11-15 DIAGNOSIS — M5459 Other low back pain: Secondary | ICD-10-CM | POA: Diagnosis not present

## 2021-11-15 DIAGNOSIS — M5416 Radiculopathy, lumbar region: Secondary | ICD-10-CM | POA: Diagnosis not present

## 2021-11-27 DIAGNOSIS — H2511 Age-related nuclear cataract, right eye: Secondary | ICD-10-CM | POA: Diagnosis not present

## 2021-11-27 DIAGNOSIS — H25813 Combined forms of age-related cataract, bilateral: Secondary | ICD-10-CM | POA: Diagnosis not present

## 2021-11-27 DIAGNOSIS — H40013 Open angle with borderline findings, low risk, bilateral: Secondary | ICD-10-CM | POA: Diagnosis not present

## 2021-11-27 DIAGNOSIS — H35373 Puckering of macula, bilateral: Secondary | ICD-10-CM | POA: Diagnosis not present

## 2021-12-03 DIAGNOSIS — Z03818 Encounter for observation for suspected exposure to other biological agents ruled out: Secondary | ICD-10-CM | POA: Diagnosis not present

## 2021-12-03 DIAGNOSIS — R059 Cough, unspecified: Secondary | ICD-10-CM | POA: Diagnosis not present

## 2021-12-03 DIAGNOSIS — R051 Acute cough: Secondary | ICD-10-CM | POA: Diagnosis not present

## 2021-12-03 DIAGNOSIS — J209 Acute bronchitis, unspecified: Secondary | ICD-10-CM | POA: Diagnosis not present

## 2021-12-03 DIAGNOSIS — J019 Acute sinusitis, unspecified: Secondary | ICD-10-CM | POA: Diagnosis not present

## 2021-12-03 DIAGNOSIS — R6883 Chills (without fever): Secondary | ICD-10-CM | POA: Diagnosis not present

## 2021-12-03 DIAGNOSIS — R509 Fever, unspecified: Secondary | ICD-10-CM | POA: Diagnosis not present

## 2021-12-03 DIAGNOSIS — Z8709 Personal history of other diseases of the respiratory system: Secondary | ICD-10-CM | POA: Diagnosis not present

## 2021-12-03 DIAGNOSIS — H6503 Acute serous otitis media, bilateral: Secondary | ICD-10-CM | POA: Diagnosis not present

## 2021-12-06 DIAGNOSIS — R509 Fever, unspecified: Secondary | ICD-10-CM | POA: Diagnosis not present

## 2021-12-06 DIAGNOSIS — R059 Cough, unspecified: Secondary | ICD-10-CM | POA: Diagnosis not present

## 2021-12-06 DIAGNOSIS — H6993 Unspecified Eustachian tube disorder, bilateral: Secondary | ICD-10-CM | POA: Diagnosis not present

## 2021-12-06 DIAGNOSIS — J209 Acute bronchitis, unspecified: Secondary | ICD-10-CM | POA: Diagnosis not present

## 2021-12-13 DIAGNOSIS — J4540 Moderate persistent asthma, uncomplicated: Secondary | ICD-10-CM | POA: Diagnosis not present

## 2021-12-13 DIAGNOSIS — J208 Acute bronchitis due to other specified organisms: Secondary | ICD-10-CM | POA: Diagnosis not present

## 2021-12-22 DIAGNOSIS — M26621 Arthralgia of right temporomandibular joint: Secondary | ICD-10-CM | POA: Diagnosis not present

## 2021-12-22 DIAGNOSIS — J3489 Other specified disorders of nose and nasal sinuses: Secondary | ICD-10-CM | POA: Diagnosis not present

## 2021-12-22 DIAGNOSIS — R053 Chronic cough: Secondary | ICD-10-CM | POA: Diagnosis not present

## 2022-01-14 DIAGNOSIS — D2262 Melanocytic nevi of left upper limb, including shoulder: Secondary | ICD-10-CM | POA: Diagnosis not present

## 2022-01-14 DIAGNOSIS — D2272 Melanocytic nevi of left lower limb, including hip: Secondary | ICD-10-CM | POA: Diagnosis not present

## 2022-01-14 DIAGNOSIS — D2261 Melanocytic nevi of right upper limb, including shoulder: Secondary | ICD-10-CM | POA: Diagnosis not present

## 2022-01-14 DIAGNOSIS — D2271 Melanocytic nevi of right lower limb, including hip: Secondary | ICD-10-CM | POA: Diagnosis not present

## 2022-01-14 DIAGNOSIS — D1801 Hemangioma of skin and subcutaneous tissue: Secondary | ICD-10-CM | POA: Diagnosis not present

## 2022-01-14 DIAGNOSIS — D225 Melanocytic nevi of trunk: Secondary | ICD-10-CM | POA: Diagnosis not present

## 2022-01-14 DIAGNOSIS — L570 Actinic keratosis: Secondary | ICD-10-CM | POA: Diagnosis not present

## 2022-01-14 DIAGNOSIS — L821 Other seborrheic keratosis: Secondary | ICD-10-CM | POA: Diagnosis not present

## 2022-01-14 DIAGNOSIS — D485 Neoplasm of uncertain behavior of skin: Secondary | ICD-10-CM | POA: Diagnosis not present

## 2022-01-14 DIAGNOSIS — L720 Epidermal cyst: Secondary | ICD-10-CM | POA: Diagnosis not present

## 2022-01-14 DIAGNOSIS — L814 Other melanin hyperpigmentation: Secondary | ICD-10-CM | POA: Diagnosis not present

## 2022-01-21 ENCOUNTER — Other Ambulatory Visit: Payer: Self-pay

## 2022-01-21 ENCOUNTER — Emergency Department (HOSPITAL_BASED_OUTPATIENT_CLINIC_OR_DEPARTMENT_OTHER): Payer: Medicare Other | Admitting: Radiology

## 2022-01-21 ENCOUNTER — Emergency Department (HOSPITAL_BASED_OUTPATIENT_CLINIC_OR_DEPARTMENT_OTHER): Payer: Medicare Other

## 2022-01-21 ENCOUNTER — Encounter (HOSPITAL_BASED_OUTPATIENT_CLINIC_OR_DEPARTMENT_OTHER): Payer: Self-pay

## 2022-01-21 DIAGNOSIS — S76012A Strain of muscle, fascia and tendon of left hip, initial encounter: Secondary | ICD-10-CM | POA: Diagnosis not present

## 2022-01-21 DIAGNOSIS — S0990XA Unspecified injury of head, initial encounter: Secondary | ICD-10-CM | POA: Diagnosis not present

## 2022-01-21 DIAGNOSIS — Z043 Encounter for examination and observation following other accident: Secondary | ICD-10-CM | POA: Diagnosis not present

## 2022-01-21 DIAGNOSIS — S161XXA Strain of muscle, fascia and tendon at neck level, initial encounter: Secondary | ICD-10-CM | POA: Diagnosis not present

## 2022-01-21 DIAGNOSIS — S060X0A Concussion without loss of consciousness, initial encounter: Secondary | ICD-10-CM | POA: Insufficient documentation

## 2022-01-21 DIAGNOSIS — S8392XA Sprain of unspecified site of left knee, initial encounter: Secondary | ICD-10-CM | POA: Insufficient documentation

## 2022-01-21 DIAGNOSIS — M7989 Other specified soft tissue disorders: Secondary | ICD-10-CM | POA: Diagnosis not present

## 2022-01-21 DIAGNOSIS — T1490XA Injury, unspecified, initial encounter: Secondary | ICD-10-CM | POA: Diagnosis not present

## 2022-01-21 DIAGNOSIS — Z91040 Latex allergy status: Secondary | ICD-10-CM | POA: Insufficient documentation

## 2022-01-21 DIAGNOSIS — W010XXA Fall on same level from slipping, tripping and stumbling without subsequent striking against object, initial encounter: Secondary | ICD-10-CM | POA: Insufficient documentation

## 2022-01-21 DIAGNOSIS — M25552 Pain in left hip: Secondary | ICD-10-CM | POA: Diagnosis not present

## 2022-01-21 NOTE — ED Triage Notes (Signed)
Patient here POV from Home.  Endorses 1800, Falling after tripping over a Bag in her Home. No LOC. No Anticoagulants.   Fell onto [employer] and landed mostly onto Face. Pain to left Knee and Left Hip as well.   NAD Noted during Triage. A&Ox4. GCS 15. Ambulatory.

## 2022-01-22 ENCOUNTER — Emergency Department (HOSPITAL_BASED_OUTPATIENT_CLINIC_OR_DEPARTMENT_OTHER): Payer: Medicare Other

## 2022-01-22 ENCOUNTER — Emergency Department (HOSPITAL_BASED_OUTPATIENT_CLINIC_OR_DEPARTMENT_OTHER)
Admission: EM | Admit: 2022-01-22 | Discharge: 2022-01-22 | Disposition: A | Discharge status: Home / Self Care | Payer: Medicare Other | Attending: Emergency Medicine | Admitting: Emergency Medicine

## 2022-01-22 DIAGNOSIS — R600 Localized edema: Secondary | ICD-10-CM | POA: Diagnosis not present

## 2022-01-22 DIAGNOSIS — S060X0A Concussion without loss of consciousness, initial encounter: Secondary | ICD-10-CM

## 2022-01-22 DIAGNOSIS — S8392XA Sprain of unspecified site of left knee, initial encounter: Secondary | ICD-10-CM

## 2022-01-22 DIAGNOSIS — S161XXA Strain of muscle, fascia and tendon at neck level, initial encounter: Secondary | ICD-10-CM

## 2022-01-22 DIAGNOSIS — S76012A Strain of muscle, fascia and tendon of left hip, initial encounter: Secondary | ICD-10-CM

## 2022-01-22 DIAGNOSIS — W19XXXA Unspecified fall, initial encounter: Secondary | ICD-10-CM

## 2022-01-22 MED ORDER — ACETAMINOPHEN 325 MG PO TABS
650.0000 mg | ORAL_TABLET | Freq: Once | ORAL | Status: AC
  Administered 2022-01-22: 650 mg via ORAL
  Filled 2022-01-22: qty 2

## 2022-01-22 NOTE — ED Provider Notes (Signed)
[facility] DEPT Provider Note   CSN: [id number] Arrival date & time: 01/21/22  1920     History  Chief Complaint  Patient presents with   Mary Rubio Mary Rubio is a 77 y.o. female.  The history is provided by the patient.  Fall This is a new problem. The current episode started yesterday. Associated symptoms include headaches. The symptoms are aggravated by walking. The symptoms are relieved by rest.  Patient presents for a fall that occurred over 24 hours ago.  She reports she tripped over the bag holding her Christmas tree on January 2.  She reports she fell flat on her face.  No LOC.  Since that time she has had headache, facial pain and neck pain.  She also reports left knee and hip pain.  She is able to ambulate.  She does not take anticoagulation.  She went to an outside urgent care who told to go to the ER She reports mild nausea but no vomiting She reports her teeth hurt, but they do not feel loose.  She had 1 episode of epistaxis that has resolved    Home Medications Prior to Admission medications   Medication Sig Start Date End Date Taking? Authorizing Provider  albuterol (VENTOLIN HFA) 108 (90 Base) MCG/ACT inhaler Inhale 2 puffs into the lungs every 6 (six) hours as needed for wheezing or shortness of breath. 07/29/20   Fransico Meadow, PA-C  azelastine (ASTELIN) 0.1 % nasal spray  05/06/18   [provider]  azelastine (OPTIVAR) 0.05 % ophthalmic solution Place 1 drop into both eyes daily.     [provider]  cholecalciferol (VITAMIN D) 1000 units tablet Take 2,000 Units by mouth daily.    [provider]  Cyanocobalamin (VITAMIN B-12) 1000 MCG SUBL  10/19/20   [provider]  EPINEPHrine 0.3 mg/0.3 mL IJ SOAJ injection Inject 0.3 mg into the muscle once.    [provider]  esomeprazole (NEXIUM) 40 MG capsule TAKE 1 CAPSULE BY MOUTH TWICE DAILY BEFORE A MEAL 08/27/21   Pyrtle, Lajuan Lines, MD   levothyroxine (SYNTHROID) 75 MCG tablet Take 75 mcg by mouth daily before breakfast.    [provider]      Allergies    Cymbalta [duloxetine hcl], Augmentin [amoxicillin-pot clavulanate], Crestor [rosuvastatin], Demerol [meperidine], Latex, Meperidine hcl, Morphine and related, Pentazocine lactate, Simvastatin, Talwin [pentazocine], and Codeine    Review of Systems   Review of Systems  Constitutional:  Negative for fever.  Gastrointestinal:  Negative for vomiting.  Skin:        Denies bruising  Neurological:  Positive for headaches.    Physical Exam Updated Vital Signs BP 129/85   Pulse 82   Temp 98.9 F (37.2 C) (Temporal)   Resp 18   Ht 1.588 m (5' 2.5")   Wt 62.3 kg   SpO2 97%   BMI 24.72 kg/m  Physical Exam CONSTITUTIONAL: Well developed/well nourished HEAD: Normocephalic/atraumatic EYES: EOMI/PERRL ENMT: Mucous membranes moist, mild swelling noted left maxilla.  No step-offs or deformities.  No evidence of any nasal trauma.  No septal hematoma.  No dental fracture.  Teeth are tender to palpation, but are intact.  No malocclusion. NECK: supple no meningeal signs SPINE/BACK:entire spine nontender Bilateral cervical paraspinal tenderness No bruising/crepitance/stepoffs noted to spine CV: S1/S2 noted, no murmurs/rubs/gallops noted LUNGS: Lungs are clear to auscultation bilaterally, no apparent distress ABDOMEN: soft, nontender, no bruising NEURO: Pt is awake/alert/appropriate, moves all extremitiesx4.  No  facial droop.   EXTREMITIES: pulses normal/equal, full ROM Mild tenderness to palpation and range of motion of left knee.  Mild swelling is noted. SKIN: warm, color normal PSYCH: no abnormalities of mood noted, alert and oriented to situation  ED Results / Procedures / Treatments   Labs (all labs ordered are listed, but only abnormal results are displayed) Labs Reviewed - No data to display  EKG None  Radiology CT Knee Left Wo Contrast  Result  Date: 01/22/2022 CLINICAL DATA:  Knee pain, stress fracture suspected. EXAM: CT OF THE LEFT KNEE WITHOUT CONTRAST TECHNIQUE: Multidetector CT imaging of the left knee was performed according to the standard protocol. Multiplanar CT image reconstructions were also generated. RADIATION DOSE REDUCTION: This exam was performed according to the departmental dose-optimization program which includes automated exposure control, adjustment of the mA and/or kV according to patient size and/or use of iterative reconstruction technique. COMPARISON:  01/21/2022. FINDINGS: Bones/Joint/Cartilage No acute fracture or dislocation. A corticated bony density is noted along the posterior aspect of the femoral condyle on the left which is likely chronic. There is superior subluxation of the patella on the lateral view suggesting patella alta. Trace joint effusion is noted at the knee. Mild to moderate tricompartmental degenerative changes, most pronounced in the patellofemoral compartment. Ligaments Suboptimally assessed by CT. Muscles and Tendons No significant intramuscular edema or hematoma. Soft tissues Minimal subcutaneous edema anterior to the knee. No large hematoma is seen. Vascular calcifications are noted. IMPRESSION: 1. No acute fracture or dislocation. 2. Trace joint effusion at the knee. 3. 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 4. Mild tricompartmental degenerative changes. Electronically Signed   By: Brett Fairy M.D.   On: 01/22/2022 02:37   CT Maxillofacial Wo Contrast  Result Date: 01/21/2022 CLINICAL DATA:  Blunt poly trauma.  Fall at home. EXAM: CT MAXILLOFACIAL WITHOUT CONTRAST TECHNIQUE: Multidetector CT imaging of the maxillofacial structures was performed. Multiplanar CT image reconstructions were also generated. RADIATION DOSE REDUCTION: This exam was performed according to the departmental dose-optimization program which includes automated exposure control, adjustment of the mA and/or  kV according to patient size and/or use of iterative reconstruction technique. COMPARISON:  Sinus CT 05/22/2021 FINDINGS: Osseous: No acute fracture of the nasal bone, mandibles, zygomatic arches, maxilla or pterygoid plates. Temporomandibular joints are congruent with degenerative change. Chronic rightward nasal septal deviation. Orbits: No acute orbital fracture. No globe injury. No orbital inflammation. Sinuses: No sinus fracture or hemosinus. No sinus fluid levels. Mild opacification of right ethmoid air cells. No mastoid effusion. Soft tissues: Chin soft tissue edema.  No confluent hematoma. Limited intracranial: Assessed on concurrent head CT, reported separately. IMPRESSION: Chin soft tissue edema. No acute facial bone fracture. Electronically Signed   By: Keith Rake M.D.   On: 01/21/2022 20:16   DG Hip Unilat W or Wo Pelvis 2-3 Views Left  Result Date: 01/21/2022 CLINICAL DATA:  Status post fall with left hip pain. EXAM: DG HIP (WITH OR WITHOUT PELVIS) 2-3V LEFT COMPARISON:  None Available. FINDINGS: There is no evidence of hip fracture or dislocation. Mild degenerative changes are seen in the form of joint space narrowing and acetabular sclerosis. IMPRESSION: Degenerative changes in the left hip. Electronically Signed   By: Virgina Norfolk M.D.   On: 01/21/2022 20:12   CT Cervical Spine Wo Contrast  Result Date: 01/21/2022 CLINICAL DATA:  Polytrauma, blunt EXAM: CT CERVICAL SPINE WITHOUT CONTRAST TECHNIQUE: Multidetector CT imaging of the cervical spine was performed without intravenous contrast. Multiplanar CT  image reconstructions were also generated. RADIATION DOSE REDUCTION: This exam was performed according to the departmental dose-optimization program which includes automated exposure control, adjustment of the mA and/or kV according to patient size and/or use of iterative reconstruction technique. COMPARISON:  None Available. FINDINGS: Alignment: Grade 1 anterolisthesis of C4 on C5 is  likely facet mediated and degenerative. No traumatic subluxation. Skull base and vertebrae: No acute fracture. Vertebral body heights are maintained. The dens and skull base are intact. Soft tissues and spinal canal: No prevertebral fluid or swelling. No visible canal hematoma. Disc levels: Disc space narrowing and spurring C4-C5 and C5-C6. Spurring at additional levels with preservation of disc spaces. Partial ankylosis of C2-C3 facets on the left. Bilateral C4-C5 facet hypertrophy, more so on the left. Upper chest: No acute or unexpected findings. Other: None. IMPRESSION: Degenerative change in the cervical spine without acute fracture or traumatic subluxation. Electronically Signed   By: Keith Rake M.D.   On: 01/21/2022 20:11   DG Knee Complete 4 Views Left  Result Date: 01/21/2022 CLINICAL DATA:  Status post fall. EXAM: LEFT KNEE - COMPLETE 4+ VIEW COMPARISON:  None Available. FINDINGS: A 7 mm cortical density is seen within the soft tissues posterior to the lateral epicondyle of the distal left femur. This is of indeterminate age. There is no evidence of dislocation. Mild lateral soft tissue swelling is seen. A small joint effusion is also noted. IMPRESSION: Findings which may represent a small fracture fragment of indeterminate age posterior to the lateral epicondyle of the distal left femur. Correlation with physical examination is recommended to determine the presence of point tenderness. Electronically Signed   By: Virgina Norfolk M.D.   On: 01/21/2022 20:08   CT Head Wo Contrast  Result Date: 01/21/2022 CLINICAL DATA:  [place] polar trauma. Tripped over a bag at home, fall onto floor. EXAM: CT HEAD WITHOUT CONTRAST TECHNIQUE: Contiguous axial images were obtained from the base of the skull through the vertex without intravenous contrast. RADIATION DOSE REDUCTION: This exam was performed according to the departmental dose-optimization program which includes automated exposure control, adjustment  of the mA and/or kV according to patient size and/or use of iterative reconstruction technique. COMPARISON:  None Available. FINDINGS: Brain: No intracranial hemorrhage, mass effect, or midline shift. No hydrocephalus. The basilar cisterns are patent. No evidence of territorial infarct or acute ischemia. No extra-axial or intracranial fluid collection. Mildly enlarged partially empty sella. Vascular: Atherosclerosis of skullbase vasculature without hyperdense vessel or abnormal calcification. Skull: No skull fracture. Rounded excrescence in the inner table of the left frontal bone has no significant mass effect or surrounding edema, of doubtful clinical significance. Sinuses/Orbits: Assessed on concurrent face CT, reported separately. Other: None. IMPRESSION: No acute intracranial abnormality. No skull fracture. Electronically Signed   By: Keith Rake M.D.   On: 01/21/2022 20:07    Procedures Procedures    Medications Ordered in ED Medications  acetaminophen (TYLENOL) tablet 650 mg (650 mg Oral Given 01/22/22 0220)    ED Course/ Medical Decision Making/ A&P Clinical Course as of 01/22/22 0311  Thu Jan 22, 2022  0311 We discussed CT knee results.  No acute fracture. ?  Patella alta, but she has full flexion extension of the left knee without difficulty.  She can fully extend the knee and flex at the hip without difficulty.  She can also ambulate without difficulty.  She can follow with orthopedics. [DW]    Clinical Course User Index [DW] Ripley Fraise, MD  Medical Decision Making Amount and/or Complexity of Data Reviewed Radiology: ordered.  Risk OTC drugs.   This patient presents to the ED for concern of fall and head injury, this involves an extensive number of treatment options, and is a complaint that carries with it a high risk of complications and morbidity.  The differential diagnosis includes but is not limited to subdural hematoma, subarachnoid  hemorrhage, skull fracture, concussion  Comorbidities that complicate the patient evaluation: Patient's presentation is complicated by their history of hypo- thyroid   Imaging Studies ordered: I ordered imaging studies including CT scan head and C-spine   I independently visualized and interpreted imaging which showed no acute findings I agree with the radiologist interpretation ?  Femoral condyle fracture, will obtain CT imaging  Medicines ordered and prescription drug management: I ordered medication including Tylenol for pain Reevaluation of the patient after these medicines showed that the patient    improved   Reevaluation: After the interventions noted above, I reevaluated the patient and found that they have :improved  Complexity of problems addressed: Patient's presentation is most consistent with  acute presentation with potential threat to life or bodily function  Disposition: After consideration of the diagnostic results and the patient's response to treatment,  I feel that the patent would benefit from discharge   .           Final Clinical Impression(s) / ED Diagnoses Final diagnoses:  Fall, initial encounter  Concussion without loss of consciousness, initial encounter  Strain of neck muscle, initial encounter  Sprain of left knee, unspecified ligament, initial encounter  Hip strain, left, initial encounter    Rx / DC Orders ED Discharge Orders     None         Ripley Fraise, MD 01/22/22 [phone]

## 2022-01-22 NOTE — ED Notes (Signed)
Patient transported to CT 

## 2022-01-27 ENCOUNTER — Encounter: Payer: Self-pay | Admitting: Internal Medicine

## 2022-01-27 ENCOUNTER — Ambulatory Visit (INDEPENDENT_AMBULATORY_CARE_PROVIDER_SITE_OTHER): Payer: Medicare Other | Admitting: Internal Medicine

## 2022-01-27 VITALS — BP 124/68 | HR 77 | Ht 62.5 in | Wt 138.0 lb

## 2022-01-27 DIAGNOSIS — K219 Gastro-esophageal reflux disease without esophagitis: Secondary | ICD-10-CM | POA: Diagnosis not present

## 2022-01-27 DIAGNOSIS — R195 Other fecal abnormalities: Secondary | ICD-10-CM | POA: Diagnosis not present

## 2022-01-27 DIAGNOSIS — K200 Eosinophilic esophagitis: Secondary | ICD-10-CM | POA: Diagnosis not present

## 2022-01-27 NOTE — Patient Instructions (Signed)
_______________________________________________________  If you are age 75 or older, your body mass index should be between 23-30. Your Body mass index is 24.84 kg/m. If this is out of the aforementioned range listed, please consider follow up with your Primary Care Provider.  If you are age 40 or younger, your body mass index should be between 19-25. Your Body mass index is 24.84 kg/m. If this is out of the aformentioned range listed, please consider follow up with your Primary Care Provider.   ________________________________________________________  The [facility] providers would like to encourage you to use [facility] to communicate with providers for non-urgent requests or questions.  Due to long hold times on the telephone, sending your provider a message by [facility] may be a faster and more efficient way to get a response.  Please allow 48 business hours for a response.  Please remember that this is for non-urgent requests.  _______________________________________________________  Take 1-2 tablespoons of Metamucil a day  Continue Nexium daily  Please follow up in 6 months

## 2022-01-27 NOTE — Progress Notes (Signed)
   Subjective:    Patient ID: Mary Rubio, female    DOB: 07-11-1945, 77 y.o.   MRN: [id number]  HPI Mary Rubio is a 77 year old female with a history of GERD previously associated with esophageal eosinophilia/EoE, history of esophageal stricture with prior dilation, history of functional abdominal pain and IBS-D who is here for follow-up.  She is here alone today was last seen in August 2023.  Unfortunately she had a mechanical fall after tripping over her Christmas tree on 01/21/2022.  She had a mild concussion and some facial contusions but fortunately no broken bones.  From a GI perspective she has been adherent to Nexium 40 mg once a day occasionally twice a day.  Her heartburn and reflux symptoms are definitively better on this medicine.  She does occasionally have to be careful with flaky dry foods like biscuits and grits.  She feels like this can stick around her upper esophageal sphincter and cause coughing.  She had prior esophageal dilation which helped with solid food dysphagia further down in her esophagus.  She is still having intermittent loose stools but forgot to try the Metamucil previously recommended.   Review of Systems As per HPI, otherwise negative  Current Medications, Allergies, Past Medical History, Past Surgical History, Family History and Social History were reviewed in [employer] record.    Objective:   Physical Exam BP 124/68 (BP Location: Left Arm, Patient Position: Sitting, Cuff Size: Normal)   Pulse 77   Ht 5' 2.5" (1.588 m)   Wt 138 lb (62.6 kg)   SpO2 99%   BMI 24.84 kg/m  Gen: awake, alert, NAD HEENT: anicteric no Neuro: nonfocal      Assessment & Plan:  77 year old female with a history of GERD previously associated with esophageal eosinophilia/EoE, history of esophageal stricture with prior dilation, history of functional abdominal pain and IBS-D who is here for follow-up.  GERD with history of esophageal  stricture/small hiatal hernia/esophageal eosinophilia with EoE responsive to PPI --she is having some possible oropharyngeal dysphagia symptoms versus symptoms of proximal esophageal stricture.  Her last esophageal dilation was with balloon in the distal esophagus and GE junction.  Nexium is definitively helpful for her -- Continue Nexium 40 mg before breakfast, can take a second dose in the evening if needed -- I discussed barium esophagram with tablet to evaluate for a proximal GI stricture but also exclude aspiration.  She wishes to think about this and let me know  2.  Loose stools --irritable in nature.  She will try the Metamucil previously recommended -- Metamucil 1 working to 2 tablespoons daily  3.  CRC screening --screening colonoscopy recommended around May 2026, sooner symptoms arise and more earlier exam  30 minutes total spent today including patient facing time, coordination of care, reviewing medical history/procedures/pertinent radiology studies, and documentation of the encounter.

## 2022-02-23 DIAGNOSIS — B354 Tinea corporis: Secondary | ICD-10-CM | POA: Diagnosis not present

## 2022-02-23 DIAGNOSIS — B029 Zoster without complications: Secondary | ICD-10-CM | POA: Diagnosis not present

## 2022-02-23 DIAGNOSIS — R21 Rash and other nonspecific skin eruption: Secondary | ICD-10-CM | POA: Diagnosis not present

## 2022-02-24 DIAGNOSIS — M25562 Pain in left knee: Secondary | ICD-10-CM | POA: Diagnosis not present

## 2022-02-24 DIAGNOSIS — M25561 Pain in right knee: Secondary | ICD-10-CM | POA: Diagnosis not present

## 2022-02-25 DIAGNOSIS — L304 Erythema intertrigo: Secondary | ICD-10-CM | POA: Diagnosis not present

## 2022-02-25 DIAGNOSIS — F0781 Postconcussional syndrome: Secondary | ICD-10-CM | POA: Diagnosis not present

## 2022-03-16 DIAGNOSIS — G44319 Acute post-traumatic headache, not intractable: Secondary | ICD-10-CM | POA: Diagnosis not present

## 2022-03-18 ENCOUNTER — Other Ambulatory Visit: Payer: Self-pay | Admitting: Sports Medicine

## 2022-03-18 DIAGNOSIS — G44319 Acute post-traumatic headache, not intractable: Secondary | ICD-10-CM

## 2022-04-11 ENCOUNTER — Other Ambulatory Visit: Payer: BLUE CROSS/BLUE SHIELD

## 2022-04-28 ENCOUNTER — Ambulatory Visit: Payer: Medicare Other | Admitting: Neurology

## 2022-04-30 ENCOUNTER — Other Ambulatory Visit: Payer: BLUE CROSS/BLUE SHIELD

## 2022-05-11 DIAGNOSIS — Z87820 Personal history of traumatic brain injury: Secondary | ICD-10-CM | POA: Diagnosis not present

## 2022-05-11 DIAGNOSIS — R519 Headache, unspecified: Secondary | ICD-10-CM | POA: Diagnosis not present

## 2022-05-11 DIAGNOSIS — H9203 Otalgia, bilateral: Secondary | ICD-10-CM | POA: Diagnosis not present

## 2022-05-11 DIAGNOSIS — F439 Reaction to severe stress, unspecified: Secondary | ICD-10-CM | POA: Diagnosis not present

## 2022-05-11 DIAGNOSIS — G44219 Episodic tension-type headache, not intractable: Secondary | ICD-10-CM | POA: Diagnosis not present

## 2022-05-20 DIAGNOSIS — H1045 Other chronic allergic conjunctivitis: Secondary | ICD-10-CM | POA: Diagnosis not present

## 2022-05-20 DIAGNOSIS — K200 Eosinophilic esophagitis: Secondary | ICD-10-CM | POA: Diagnosis not present

## 2022-05-20 DIAGNOSIS — J3089 Other allergic rhinitis: Secondary | ICD-10-CM | POA: Diagnosis not present

## 2022-05-20 DIAGNOSIS — J301 Allergic rhinitis due to pollen: Secondary | ICD-10-CM | POA: Diagnosis not present

## 2022-06-03 DIAGNOSIS — Z1231 Encounter for screening mammogram for malignant neoplasm of breast: Secondary | ICD-10-CM | POA: Diagnosis not present

## 2022-06-23 DIAGNOSIS — R110 Nausea: Secondary | ICD-10-CM | POA: Diagnosis not present

## 2022-06-23 DIAGNOSIS — R5383 Other fatigue: Secondary | ICD-10-CM | POA: Diagnosis not present

## 2022-06-23 DIAGNOSIS — R109 Unspecified abdominal pain: Secondary | ICD-10-CM | POA: Diagnosis not present

## 2022-06-23 DIAGNOSIS — R52 Pain, unspecified: Secondary | ICD-10-CM | POA: Diagnosis not present

## 2022-06-23 DIAGNOSIS — R051 Acute cough: Secondary | ICD-10-CM | POA: Diagnosis not present

## 2022-06-23 DIAGNOSIS — B349 Viral infection, unspecified: Secondary | ICD-10-CM | POA: Diagnosis not present

## 2022-06-23 DIAGNOSIS — Z03818 Encounter for observation for suspected exposure to other biological agents ruled out: Secondary | ICD-10-CM | POA: Diagnosis not present

## 2022-07-22 ENCOUNTER — Ambulatory Visit
Admission: RE | Admit: 2022-07-22 | Discharge: 2022-07-22 | Disposition: A | Discharge status: Home / Self Care | Payer: Medicare Other | Source: Ambulatory Visit | Attending: Physician Assistant | Admitting: Physician Assistant

## 2022-07-22 ENCOUNTER — Other Ambulatory Visit: Payer: Self-pay | Admitting: Physician Assistant

## 2022-07-22 DIAGNOSIS — M549 Dorsalgia, unspecified: Secondary | ICD-10-CM | POA: Diagnosis not present

## 2022-07-22 DIAGNOSIS — R309 Painful micturition, unspecified: Secondary | ICD-10-CM | POA: Diagnosis not present

## 2022-07-22 DIAGNOSIS — N390 Urinary tract infection, site not specified: Secondary | ICD-10-CM | POA: Diagnosis not present

## 2022-07-22 DIAGNOSIS — R3129 Other microscopic hematuria: Secondary | ICD-10-CM | POA: Diagnosis not present

## 2022-07-22 DIAGNOSIS — M545 Low back pain, unspecified: Secondary | ICD-10-CM | POA: Diagnosis not present

## 2022-07-25 DIAGNOSIS — H60501 Unspecified acute noninfective otitis externa, right ear: Secondary | ICD-10-CM | POA: Diagnosis not present

## 2022-07-30 DIAGNOSIS — H60501 Unspecified acute noninfective otitis externa, right ear: Secondary | ICD-10-CM | POA: Diagnosis not present

## 2022-08-11 DIAGNOSIS — R8290 Unspecified abnormal findings in urine: Secondary | ICD-10-CM | POA: Diagnosis not present

## 2022-08-11 DIAGNOSIS — R3129 Other microscopic hematuria: Secondary | ICD-10-CM | POA: Diagnosis not present

## 2022-08-13 ENCOUNTER — Encounter: Payer: Self-pay | Admitting: Internal Medicine

## 2022-08-13 ENCOUNTER — Ambulatory Visit: Payer: Medicare Other | Admitting: Internal Medicine

## 2022-08-13 ENCOUNTER — Other Ambulatory Visit: Payer: Medicare Other

## 2022-08-13 VITALS — BP 118/68 | HR 68 | Ht 62.0 in | Wt 135.0 lb

## 2022-08-13 DIAGNOSIS — K200 Eosinophilic esophagitis: Secondary | ICD-10-CM | POA: Diagnosis not present

## 2022-08-13 DIAGNOSIS — R1314 Dysphagia, pharyngoesophageal phase: Secondary | ICD-10-CM | POA: Diagnosis not present

## 2022-08-13 DIAGNOSIS — K219 Gastro-esophageal reflux disease without esophagitis: Secondary | ICD-10-CM

## 2022-08-13 DIAGNOSIS — R319 Hematuria, unspecified: Secondary | ICD-10-CM | POA: Diagnosis not present

## 2022-08-13 DIAGNOSIS — K580 Irritable bowel syndrome with diarrhea: Secondary | ICD-10-CM

## 2022-08-13 MED ORDER — HYOSCYAMINE SULFATE 0.125 MG SL SUBL: 0.1250 mg | SUBLINGUAL_TABLET | SUBLINGUAL | 11 refills | Status: DC | PRN

## 2022-08-13 MED ORDER — ESOMEPRAZOLE MAGNESIUM 40 MG PO CPDR: DELAYED_RELEASE_CAPSULE | ORAL | 11 refills | Status: DC

## 2022-08-13 NOTE — Patient Instructions (Addendum)
Your provider has requested that you go to the basement level for lab work before leaving today. Press "B" on the elevator. The lab is located at the first door on the left as you exit the elevator.  We have sent the following medications to your pharmacy for you to pick up at your convenience: Nexium 40 mg taking 1 capsule by mouth 1-2 x daily and Levsin taking 1 capsule by mouth every 4 hours as needed for abdominal cramping/pain.  _______________________________________________________  If your blood pressure at your visit was 140/90 or greater, please contact your primary care physician to follow up on this.  _______________________________________________________  If you are age 75 or older, your body mass index should be between 23-30. Your Body mass index is 24.69 kg/m. If this is out of the aforementioned range listed, please consider follow up with your Primary Care Provider.  If you are age 67 or younger, your body mass index should be between 19-25. Your Body mass index is 24.69 kg/m. If this is out of the aformentioned range listed, please consider follow up with your Primary Care Provider.   ________________________________________________________  The [facility] providers would like to encourage you to use [facility] to communicate with providers for non-urgent requests or questions.  Due to long hold times on the telephone, sending your provider a message by [facility] may be a faster and more efficient way to get a response.  Please allow 48 business hours for a response.  Please remember that this is for non-urgent requests.  _______________________________________________________

## 2022-08-13 NOTE — Progress Notes (Signed)
Subjective:    Patient ID: Mary Rubio, female    DOB: Jul 19, 1945, 77 y.o.   MRN: [id number]  HPI Mary Rubio is a 77 year old female with a history of GERD previously associated with esophageal eosinophilia/EoE, history of esophageal stricture requiring dilation, history of functional abdominal pain and IBS-D who is here for follow-up.  She was last seen in January 2024.  She reports that she is feeling fairly well.  She continues Nexium at least once daily and occasionally twice a day.  Her reflux and swallowing have not gotten any worse.  She will occasionally feel choking with liquids.  She tries to chew well and take very small bites.  Her loose stools continue most days but diet definitively affects her bowel movements.  She states if she has coffee in the morning she typically has diarrhea about 30 minutes later but then no further stools throughout the day.  Tea is also a trigger as well as occasionally salads.  She will feel lower abdominal cramping with bowel movements and occasionally right upper quadrant spasm.  Fried foods, butter and tomatoes make her lower cramping worse.  She has had some blood in her urine on 3 occasions initially moderate and then trace x 2.  She had a CT scan done at [facility] primary care which was negative.  She is also had some lower abdominal burning pain after urination but not with urination.  She has been referred to urology.  She is status postcholecystectomy and hysterectomy with oophorectomy  Review of Systems As per HPI, otherwise negative  Current Medications, Allergies, Past Medical History, Past Surgical History, Family History and Social History were reviewed in [employer] record.    Objective:   Physical Exam BP 118/68   Pulse 68   Ht 5\' 2"  (1.575 m)   Wt 135 lb (61.2 kg)   BMI 24.69 kg/m  Gen: awake, alert, NAD HEENT: anicteric  CV: RRR, no mrg Pulm: CTA b/l Abd: soft, mild tenderness in the  lower right greater than left abdomen without rebound or guarding, ND, +BS throughout Ext: no c/c/e Neuro: nonfocal  [facility] labs not visible via [facility]Everywhere, but done this year per patient      Assessment & Plan:  77 year old female with a history of GERD previously associated with esophageal eosinophilia/EoE, history of esophageal stricture requiring dilation, history of functional abdominal pain and IBS-D who is here for follow-up.    GERD with history of esophageal stricture/small hiatal hernia/esophageal eosinophilia responsive to PPI --moderate oropharyngeal dysphagia versus proximal esophageal stricture.  Stable symptoms without alarm --Continue Nexium 40 mg before breakfast, can take a second dose in the evening if needed  2.  IBS with lower abdominal cramping and loose stool --not having pure diarrhea.  Diet triggered.  Most consistent with IBS --Levsin 0.125 mg every 4 hours as needed crampy abdominal pain --FIT testing given darker stool on occasion  3.  Colon cancer screening --normal colonoscopy in May 2016; would be due repeat colonoscopy in 2026 unless FIT is positive  4.  Blood in urine and burning pelvic pain --she has been referred to urology which is completely appropriate.  Would also consider gynecologic evaluation, perhaps even urogyn could be a 1 visit option.  If no bladder or urethral or kidney issue then exclude vaginal atrophy causing burning pain.  10-month follow-up, sooner if needed  40 minutes total spent today including patient facing time, coordination of care, reviewing medical history/procedures/pertinent radiology studies,  and documentation of the encounter.

## 2022-08-17 ENCOUNTER — Ambulatory Visit (INDEPENDENT_AMBULATORY_CARE_PROVIDER_SITE_OTHER): Payer: Medicare Other

## 2022-08-17 DIAGNOSIS — K580 Irritable bowel syndrome with diarrhea: Secondary | ICD-10-CM | POA: Diagnosis not present

## 2022-08-17 LAB — FECAL OCCULT BLOOD, IMMUNOCHEMICAL: Fecal Occult Bld: NEGATIVE

## 2022-09-24 DIAGNOSIS — Z9109 Other allergy status, other than to drugs and biological substances: Secondary | ICD-10-CM | POA: Diagnosis not present

## 2022-09-24 DIAGNOSIS — E039 Hypothyroidism, unspecified: Secondary | ICD-10-CM | POA: Diagnosis not present

## 2022-09-24 DIAGNOSIS — Z131 Encounter for screening for diabetes mellitus: Secondary | ICD-10-CM | POA: Diagnosis not present

## 2022-09-24 DIAGNOSIS — R5382 Chronic fatigue, unspecified: Secondary | ICD-10-CM | POA: Diagnosis not present

## 2022-09-24 DIAGNOSIS — F5104 Psychophysiologic insomnia: Secondary | ICD-10-CM | POA: Diagnosis not present

## 2022-09-24 DIAGNOSIS — E7800 Pure hypercholesterolemia, unspecified: Secondary | ICD-10-CM | POA: Diagnosis not present

## 2022-09-24 DIAGNOSIS — F411 Generalized anxiety disorder: Secondary | ICD-10-CM | POA: Diagnosis not present

## 2022-09-30 DIAGNOSIS — R3121 Asymptomatic microscopic hematuria: Secondary | ICD-10-CM | POA: Diagnosis not present

## 2022-09-30 DIAGNOSIS — R350 Frequency of micturition: Secondary | ICD-10-CM | POA: Diagnosis not present

## 2022-11-09 DIAGNOSIS — E538 Deficiency of other specified B group vitamins: Secondary | ICD-10-CM | POA: Diagnosis not present

## 2022-11-09 DIAGNOSIS — M8588 Other specified disorders of bone density and structure, other site: Secondary | ICD-10-CM | POA: Diagnosis not present

## 2022-11-09 DIAGNOSIS — E559 Vitamin D deficiency, unspecified: Secondary | ICD-10-CM | POA: Diagnosis not present

## 2022-11-09 DIAGNOSIS — R110 Nausea: Secondary | ICD-10-CM | POA: Diagnosis not present

## 2022-11-09 DIAGNOSIS — N1831 Chronic kidney disease, stage 3a: Secondary | ICD-10-CM | POA: Diagnosis not present

## 2022-11-09 DIAGNOSIS — Z0000 Encounter for general adult medical examination without abnormal findings: Secondary | ICD-10-CM | POA: Diagnosis not present

## 2022-12-15 DIAGNOSIS — M25561 Pain in right knee: Secondary | ICD-10-CM | POA: Diagnosis not present

## 2022-12-15 DIAGNOSIS — M7701 Medial epicondylitis, right elbow: Secondary | ICD-10-CM | POA: Diagnosis not present

## 2022-12-15 DIAGNOSIS — M180 Bilateral primary osteoarthritis of first carpometacarpal joints: Secondary | ICD-10-CM | POA: Diagnosis not present

## 2023-01-07 DIAGNOSIS — R350 Frequency of micturition: Secondary | ICD-10-CM | POA: Diagnosis not present

## 2023-01-07 DIAGNOSIS — H9202 Otalgia, left ear: Secondary | ICD-10-CM | POA: Diagnosis not present

## 2023-01-07 DIAGNOSIS — R3121 Asymptomatic microscopic hematuria: Secondary | ICD-10-CM | POA: Diagnosis not present

## 2023-01-07 DIAGNOSIS — G8929 Other chronic pain: Secondary | ICD-10-CM | POA: Diagnosis not present

## 2023-01-07 DIAGNOSIS — M8588 Other specified disorders of bone density and structure, other site: Secondary | ICD-10-CM | POA: Diagnosis not present

## 2023-01-07 DIAGNOSIS — H9201 Otalgia, right ear: Secondary | ICD-10-CM | POA: Diagnosis not present

## 2023-01-11 ENCOUNTER — Other Ambulatory Visit (HOSPITAL_COMMUNITY): Payer: Self-pay | Admitting: Urology

## 2023-01-11 DIAGNOSIS — R3129 Other microscopic hematuria: Secondary | ICD-10-CM

## 2023-01-18 DIAGNOSIS — D2261 Melanocytic nevi of right upper limb, including shoulder: Secondary | ICD-10-CM | POA: Diagnosis not present

## 2023-01-18 DIAGNOSIS — D225 Melanocytic nevi of trunk: Secondary | ICD-10-CM | POA: Diagnosis not present

## 2023-01-18 DIAGNOSIS — L570 Actinic keratosis: Secondary | ICD-10-CM | POA: Diagnosis not present

## 2023-01-18 DIAGNOSIS — L720 Epidermal cyst: Secondary | ICD-10-CM | POA: Diagnosis not present

## 2023-01-18 DIAGNOSIS — L738 Other specified follicular disorders: Secondary | ICD-10-CM | POA: Diagnosis not present

## 2023-01-18 DIAGNOSIS — D2271 Melanocytic nevi of right lower limb, including hip: Secondary | ICD-10-CM | POA: Diagnosis not present

## 2023-01-18 DIAGNOSIS — D2262 Melanocytic nevi of left upper limb, including shoulder: Secondary | ICD-10-CM | POA: Diagnosis not present

## 2023-01-18 DIAGNOSIS — L821 Other seborrheic keratosis: Secondary | ICD-10-CM | POA: Diagnosis not present

## 2023-01-18 DIAGNOSIS — L718 Other rosacea: Secondary | ICD-10-CM | POA: Diagnosis not present

## 2023-01-19 ENCOUNTER — Ambulatory Visit (HOSPITAL_COMMUNITY)
Admission: RE | Admit: 2023-01-19 | Discharge: 2023-01-19 | Disposition: A | Discharge status: Home / Self Care | Payer: Medicare Other | Source: Ambulatory Visit | Attending: Urology | Admitting: Urology

## 2023-01-19 DIAGNOSIS — K449 Diaphragmatic hernia without obstruction or gangrene: Secondary | ICD-10-CM | POA: Diagnosis not present

## 2023-01-19 DIAGNOSIS — Z9049 Acquired absence of other specified parts of digestive tract: Secondary | ICD-10-CM | POA: Diagnosis not present

## 2023-01-19 DIAGNOSIS — R3129 Other microscopic hematuria: Secondary | ICD-10-CM | POA: Diagnosis not present

## 2023-01-19 LAB — POCT I-STAT CREATININE: Creatinine, Ser: 1 mg/dL (ref 0.44–1.00)

## 2023-01-19 MED ORDER — IOHEXOL 350 MG/ML SOLN
100.0000 mL | Freq: Once | INTRAVENOUS | Status: AC | PRN
  Administered 2023-01-19: 100 mL via INTRAVENOUS

## 2023-01-22 ENCOUNTER — Ambulatory Visit (INDEPENDENT_AMBULATORY_CARE_PROVIDER_SITE_OTHER): Payer: Medicare Other | Admitting: Internal Medicine

## 2023-01-22 ENCOUNTER — Encounter: Payer: Self-pay | Admitting: Internal Medicine

## 2023-01-22 VITALS — BP 118/78 | HR 77 | Ht 62.0 in | Wt 135.0 lb

## 2023-01-22 DIAGNOSIS — R1310 Dysphagia, unspecified: Secondary | ICD-10-CM

## 2023-01-22 DIAGNOSIS — K2100 Gastro-esophageal reflux disease with esophagitis, without bleeding: Secondary | ICD-10-CM | POA: Diagnosis not present

## 2023-01-22 DIAGNOSIS — K219 Gastro-esophageal reflux disease without esophagitis: Secondary | ICD-10-CM

## 2023-01-22 DIAGNOSIS — N182 Chronic kidney disease, stage 2 (mild): Secondary | ICD-10-CM

## 2023-01-22 DIAGNOSIS — R319 Hematuria, unspecified: Secondary | ICD-10-CM

## 2023-01-22 DIAGNOSIS — K200 Eosinophilic esophagitis: Secondary | ICD-10-CM

## 2023-01-22 MED ORDER — ESOMEPRAZOLE MAGNESIUM 40 MG PO CPDR: DELAYED_RELEASE_CAPSULE | ORAL | 11 refills | Status: AC

## 2023-01-22 NOTE — Progress Notes (Signed)
   Subjective:    Patient ID: Mary Rubio, female    DOB: 1945/02/15, 78 y.o.   MRN: [id number]  HPI [name] is a 78 year old female with a history of GERD previously associated with esophageal eosinophilia/EoE, history of esophageal stricture requiring dilation, history of functional abdominal pain and IBS-D who is here for follow-up.   The patient, with a history of gastroesophageal reflux disease (GERD) and eosinophilic esophagitis (EoE), presents with worsening dysphagia and epigastric pain. She reports a sensation of food sticking in the esophagus, particularly with certain foods like biscuits. The patient also experiences pain in the epigastric region, which she describes as a tight sensation. Despite taking Nexium  40mg  daily, and occasionally twice daily, the symptoms persist. The patient's eating habits have been affected, with a reduction to one large meal and a small snack per day due to the discomfort.  The patient also reports intermittent right-sided earache and headaches, with pain radiating down into the neck upon swallowing. This pain is not consistent but has been increasing in frequency over the past year. The patient describes a sensation of swelling or tightness in the neck after eating certain foods, although there is no visible swelling.  In addition to the above, the patient has been diagnosed with stage 2 kidney disease, with persistent hematuria since January. A recent CT scan with contrast was performed to rule out kidney cancer, following the standard protocol for such a diagnosis. The patient also underwent a cystoscopy, which showed no signs of bladder disease or cancer.  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 Despite these challenges, the patient remains active in her role and is managing her various health concerns.   Review of Systems As per HPI, otherwise  negative  Current Medications, Allergies, Past Medical History, Past Surgical History, Family History and Social History were reviewed in [employer] record.    Objective:   Physical Exam BP 118/78   Pulse 77   Ht 5' 2 (1.575 m)   Wt 135 lb (61.2 kg)   BMI 24.69 kg/m  Gen: awake, alert, NAD HEENT: anicteric  Ext: no c/c/e Neuro: nonfocal  FIT negative, 08/17/2022      Assessment & Plan:  78 year old female with a history of GERD previously associated with esophageal eosinophilia/EoE, history of esophageal stricture requiring dilation, history of functional abdominal pain and IBS-D who is here for follow-up.   Esophageal Stricture/GERD with eosinophilia Reports of dysphagia and epigastric pain despite Nexium  40mg  daily. History of previous esophageal dilations. -Schedule EGD with possible dilation -Continue Nexium  40mg  daily until EGD. -Discuss possible medication changes after EGD.  Chronic Kidney Disease Stage 2 Recent diagnosis with persistent hematuria. Recent CT scan with contrast performed by urology to rule out kidney cancer. -Await results of CT scan. -Continue current management as directed by primary care and urology.  General Health Maintenance -Annual fecal immunochemical test (FIT) for colorectal cancer screening. -Consider colonoscopy in 2026.  30 minutes total spent today including patient facing time, coordination of care, reviewing medical history/procedures/pertinent radiology studies, and documentation of the encounter.

## 2023-01-22 NOTE — Patient Instructions (Signed)
 We have sent the following medications to your pharmacy for you to pick up at your convenience:Nexium  40 mg twice daily.   You have been scheduled for an endoscopy. Please follow written instructions given to you at your visit today.  If you use inhalers (even only as needed), please bring them with you on the day of your procedure.  If you take any of the following medications, they will need to be adjusted prior to your procedure:   DO NOT TAKE 7 DAYS PRIOR TO TEST- Trulicity (dulaglutide) Ozempic, Wegovy (semaglutide) Mounjaro (tirzepatide) Bydureon Bcise (exanatide extended release)  DO NOT TAKE 1 DAY PRIOR TO YOUR TEST Rybelsus (semaglutide) Adlyxin (lixisenatide) Victoza (liraglutide) Byetta (exanatide) ___________________________________________________________________________   If your blood pressure at your visit was 140/90 or greater, please contact your primary care physician to follow up on this.  _______________________________________________________  If you are age 13 or older, your body mass index should be between 23-30. Your Body mass index is 24.69 kg/m. If this is out of the aforementioned range listed, please consider follow up with your Primary Care Provider.  If you are age 29 or younger, your body mass index should be between 19-25. Your Body mass index is 24.69 kg/m. If this is out of the aformentioned range listed, please consider follow up with your Primary Care Provider.   ________________________________________________________  The [facility] providers would like to encourage you to use MYCHART to communicate with providers for non-urgent requests or questions.  Due to long hold times on the telephone, sending your provider a message by [facility] may be a faster and more efficient way to get a response.  Please allow 48 business hours for a response.  Please remember that this is for non-urgent requests.   _______________________________________________________

## 2023-01-25 ENCOUNTER — Encounter: Payer: Self-pay | Admitting: Certified Registered Nurse Anesthetist

## 2023-01-27 ENCOUNTER — Encounter: Payer: Self-pay | Admitting: Internal Medicine

## 2023-01-27 ENCOUNTER — Ambulatory Visit (AMBULATORY_SURGERY_CENTER): Payer: Medicare Other | Admitting: Internal Medicine

## 2023-01-27 VITALS — BP 138/72 | HR 85 | Temp 98.2°F | Resp 15 | Ht 62.0 in | Wt 135.0 lb

## 2023-01-27 DIAGNOSIS — K2289 Other specified disease of esophagus: Secondary | ICD-10-CM

## 2023-01-27 DIAGNOSIS — K219 Gastro-esophageal reflux disease without esophagitis: Secondary | ICD-10-CM

## 2023-01-27 DIAGNOSIS — K200 Eosinophilic esophagitis: Secondary | ICD-10-CM

## 2023-01-27 DIAGNOSIS — K449 Diaphragmatic hernia without obstruction or gangrene: Secondary | ICD-10-CM

## 2023-01-27 DIAGNOSIS — K222 Esophageal obstruction: Secondary | ICD-10-CM | POA: Diagnosis not present

## 2023-01-27 MED ORDER — SODIUM CHLORIDE 0.9 % IV SOLN: 500.0000 mL | INTRAVENOUS | Status: DC

## 2023-01-27 NOTE — Patient Instructions (Signed)
 Educational handout provided to patient related to post dilation diet & stricture  Resume previous diet  Continue present medications  Awaiting pathology results  YOU HAD AN ENDOSCOPIC PROCEDURE TODAY AT THE [facility]:   Refer to the procedure report that was given to you for any specific questions about what was found during the examination.  If the procedure report does not answer your questions, please call your gastroenterologist to clarify.  If you requested that your care partner not be given the details of your procedure findings, then the procedure report has been included in a sealed envelope for you to review at your convenience later.  YOU SHOULD EXPECT: Some feelings of bloating in the abdomen. Passage of more gas than usual.  Walking can help get rid of the air that was put into your GI tract during the procedure and reduce the bloating. If you had a lower endoscopy (such as a colonoscopy or flexible sigmoidoscopy) you may notice spotting of blood in your stool or on the toilet paper. If you underwent a bowel prep for your procedure, you may not have a normal bowel movement for a few days.  Please Note:  You might notice some irritation and congestion in your nose or some drainage.  This is from the oxygen used during your procedure.  There is no need for concern and it should clear up in a day or so.  SYMPTOMS TO REPORT IMMEDIATELY:  Following upper endoscopy (EGD)  Vomiting of blood or coffee ground material  New chest pain or pain under the shoulder blades  Painful or persistently difficult swallowing  New shortness of breath  Fever of 100F or higher  Black, tarry-looking stools  For urgent or emergent issues, a gastroenterologist can be reached at any hour by calling [phone]. Do not use MyChart messaging for urgent concerns.    DIET:  We do recommend a small meal at first, but then you may proceed to your regular diet.  Drink plenty of fluids but  you should avoid alcoholic beverages for 24 hours.  ACTIVITY:  You should plan to take it easy for the rest of today and you should NOT DRIVE or use heavy machinery until tomorrow (because of the sedation medicines used during the test).    FOLLOW UP: Our staff will call the number listed on your records the next business day following your procedure.  We will call around 7:15- 8:00 am to check on you and address any questions or concerns that you may have regarding the information given to you following your procedure. If we do not reach you, we will leave a message.     If any biopsies were taken you will be contacted by phone or by letter within the next 1-3 weeks.  Please call us  at [phone] if you have not heard about the biopsies in 3 weeks.    SIGNATURES/CONFIDENTIALITY: You and/or your care partner have signed paperwork which will be entered into your electronic medical record.  These signatures attest to the fact that that the information above on your After Visit Summary has been reviewed and is understood.  Full responsibility of the confidentiality of this discharge information lies with you and/or your care-partner.

## 2023-01-27 NOTE — Progress Notes (Signed)
 Called to room to assist during endoscopic procedure.  Patient ID and intended procedure confirmed with present staff. Received instructions for my participation in the procedure from the performing physician.

## 2023-01-27 NOTE — Progress Notes (Signed)
 Please see office note dated 01/22/2023 for details and current H&P  Patient presenting for follow-up esophageal stricture, dysphagia and GERD with eosinophilia Currently Nexium 40 mg daily  She remains appropriate for EGD

## 2023-01-27 NOTE — Progress Notes (Signed)
1440 Robinul 0.1 mg IV given due large amount of secretions upon assessment.  MD made aware, vss  

## 2023-01-27 NOTE — Op Note (Signed)
 [facility] Patient Name: Mary Rubio Procedure Date: 01/27/2023 2:43 PM MRN: [id number] Endoscopist: Gordy CHRISTELLA Starch , MD, [id number] Age: 78 Referring MD:  Date of Birth: October 13, 1945 Gender: Female Account #: [id number] Procedure:                Upper GI endoscopy Indications:              Dysphagia, Gastro-esophageal reflux disease; hx of                            previous dilation with symptom improvement (last                            EGD Dec 2022 balloon dilation to 17 mm), current                            Nexium  40 mg once daily Medicines:                Monitored Anesthesia Care Procedure:                Pre-Anesthesia Assessment:                           - Prior to the procedure, a History and Physical                            was performed, and patient medications and                            allergies were reviewed. The patient's tolerance of                            previous anesthesia was also reviewed. The risks                            and benefits of the procedure and the sedation                            options and risks were discussed with the patient.                            All questions were answered, and informed consent                            was obtained. Prior Anticoagulants: The patient has                            taken no anticoagulant or antiplatelet agents. ASA                            Grade Assessment: III - A patient with severe                            systemic disease. After reviewing the risks and  benefits, the patient was deemed in satisfactory                            condition to undergo the procedure.                           After obtaining informed consent, the endoscope was                            passed under direct vision. Throughout the                            procedure, the patient's blood pressure, pulse, and                            oxygen saturations were  monitored continuously. The                            GIF [id number] #[id number] was introduced through the                            mouth, and advanced to the second part of duodenum.                            The upper GI endoscopy was accomplished without                            difficulty. The patient tolerated the procedure                            well. Scope In: Scope Out: Findings:                 Diffuse mild mucosal changes characterized by                            scarring (post esophagitis) were found in the                            entire esophagus. Several biopsies were obtained                            with cold forceps for histology in the lower third                            of the esophagus to exclude active inflammation.                           Two benign-appearing, intrinsic moderate                            (circumferential scarring or stenosis; an endoscope                            may pass) stenoses were found 36 to 38  cm from the                            incisors. The narrowest stenosis measured 1.2 cm                            (inner diameter) x 2 cm (in length). The stenoses                            were traversed. A TTS dilator was passed through                            the scope. Dilation with a 15-16.5-18 mm balloon                            dilator was performed to 16.5 mm. The dilation site                            was examined and showed moderate mucosal disruption.                           A 3 cm hiatal hernia was present.                           The gastroesophageal flap valve was visualized                            endoscopically and classified as Hill Grade IV (no                            fold, wide open lumen, hiatal hernia present).                           The entire examined stomach was normal.                           The examined duodenum was normal. Complications:            No immediate  complications. Estimated Blood Loss:     Estimated blood loss was minimal. Impression:               - Scarred (post esophagitis) mucosa in the                            esophagus.                           - Benign-appearing esophageal stenoses. Dilated to                            16.5 mm with balloon.                           - 3 cm hiatal hernia.                           -  Normal stomach.                           - Normal examined duodenum.                           - Biopsies performed in the lower third of the                            esophagus to evaluate for active inflammation. Recommendation:           - Patient has a contact number available for                            emergencies. The signs and symptoms of potential                            delayed complications were discussed with the                            patient. Return to normal activities tomorrow.                            Written discharge instructions were provided to the                            patient.                           - Resume previous diet.                           - Continue present medications.                           - Await pathology results.                           - Repeat upper endoscopy PRN for retreatment. Gordy CHRISTELLA Starch, MD 01/27/2023 3:01:57 PM This report has been signed electronically.

## 2023-01-27 NOTE — Progress Notes (Signed)
 Pt's states no medical or surgical changes since previsit or office visit.

## 2023-01-28 ENCOUNTER — Telehealth: Payer: Self-pay

## 2023-01-28 NOTE — Telephone Encounter (Signed)
  Follow up Call-     01/27/2023    2:19 PM 12/20/2020    9:29 AM 12/20/2020    9:23 AM  Call back number  Post procedure Call Back phone  # [phone]   Permission to leave phone message Yes  Yes     Patient questions:  Do you have a fever, pain , or abdominal swelling? No. Pain Score  0 *  Have you tolerated food without any problems? Yes.    Have you been able to return to your normal activities? Yes.    Do you have any questions about your discharge instructions: Diet   No. Medications  No. Follow up visit  No.  Do you have questions or concerns about your Care? No.  Actions: * If pain score is 4 or above: No action needed, pain <4.

## 2023-02-01 LAB — SURGICAL PATHOLOGY

## 2023-02-05 ENCOUNTER — Encounter (INDEPENDENT_AMBULATORY_CARE_PROVIDER_SITE_OTHER): Payer: Self-pay | Admitting: Otolaryngology

## 2023-02-05 ENCOUNTER — Encounter: Payer: Self-pay | Admitting: Internal Medicine

## 2023-02-08 DIAGNOSIS — M25562 Pain in left knee: Secondary | ICD-10-CM | POA: Diagnosis not present

## 2023-02-08 DIAGNOSIS — M25561 Pain in right knee: Secondary | ICD-10-CM | POA: Diagnosis not present

## 2023-02-15 ENCOUNTER — Encounter: Payer: Self-pay | Admitting: Internal Medicine

## 2023-02-15 NOTE — Telephone Encounter (Signed)
01/27/23 EGD and path results faxed to PCP via epic.

## 2023-02-25 IMAGING — CR DG CHEST 2V
2 series · 2 of 2 positions shown · non-contrast
Comparison: PA and lateral chest 11/02/2017.

CLINICAL DATA: Patient diagnosed with cellulitis. Lungs some to the
left arm.

EXAM:
CHEST - 2 VIEW

[w chest pa]
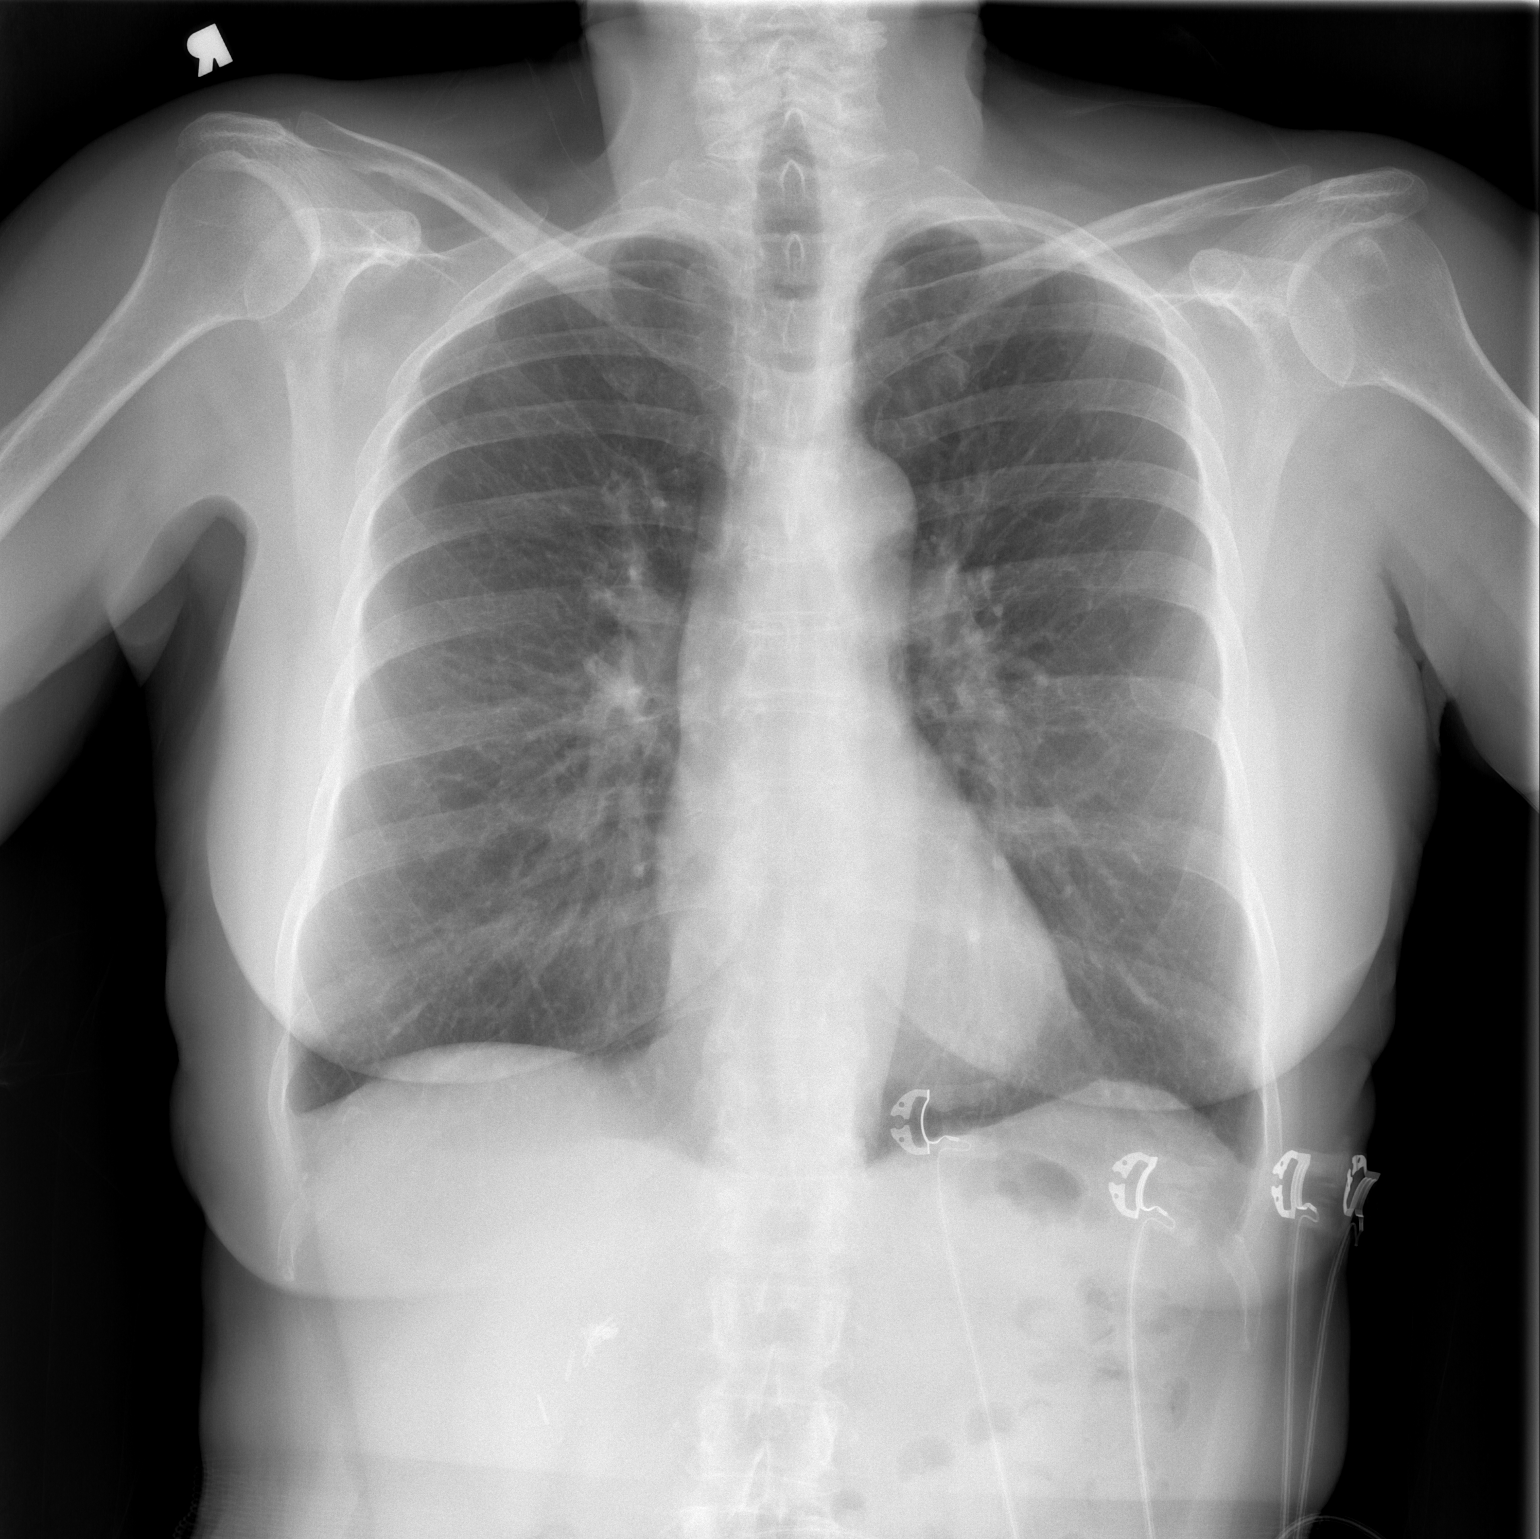

[w chest lat]
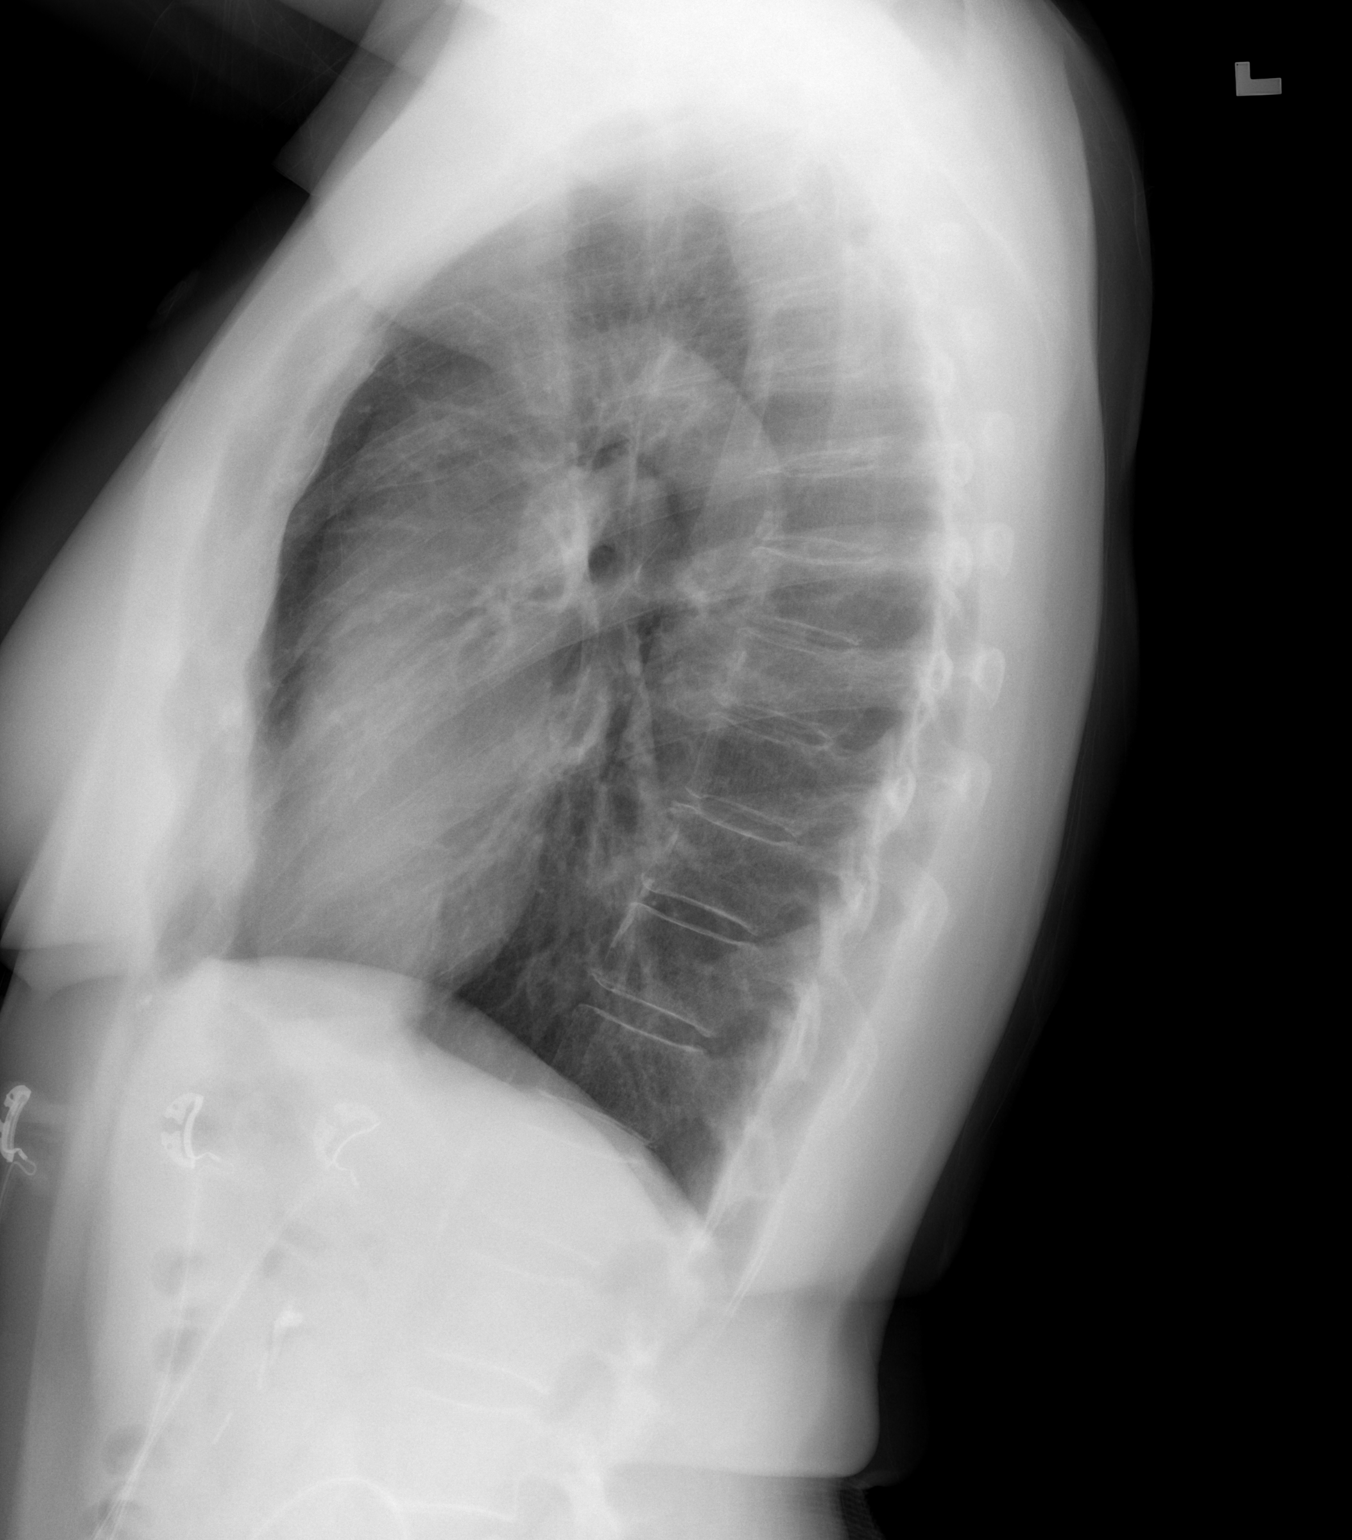

[2 of 2 positions shown; findings below may reference images not displayed]

FINDINGS: Lungs clear. Heart size normal. No pneumothorax or pleural effusion.
Aortic atherosclerosis. No acute or focal bony abnormality.
IMPRESSION: No acute disease.

Aortic Atherosclerosis (GNM5P-D26.6).

## 2023-03-04 DIAGNOSIS — N130 Hydronephrosis with ureteropelvic junction obstruction: Secondary | ICD-10-CM | POA: Diagnosis not present

## 2023-03-04 DIAGNOSIS — R3121 Asymptomatic microscopic hematuria: Secondary | ICD-10-CM | POA: Diagnosis not present

## 2023-03-08 ENCOUNTER — Encounter: Payer: Self-pay | Admitting: Internal Medicine

## 2023-03-19 DIAGNOSIS — R252 Cramp and spasm: Secondary | ICD-10-CM | POA: Diagnosis not present

## 2023-04-05 ENCOUNTER — Telehealth (INDEPENDENT_AMBULATORY_CARE_PROVIDER_SITE_OTHER): Payer: Self-pay | Admitting: Otolaryngology

## 2023-04-05 NOTE — Telephone Encounter (Signed)
 spoke w/ patient & confirmed appt date, time & location

## 2023-04-06 ENCOUNTER — Ambulatory Visit (INDEPENDENT_AMBULATORY_CARE_PROVIDER_SITE_OTHER): Payer: BLUE CROSS/BLUE SHIELD | Admitting: Otolaryngology

## 2023-04-06 ENCOUNTER — Encounter (INDEPENDENT_AMBULATORY_CARE_PROVIDER_SITE_OTHER): Payer: Self-pay

## 2023-04-06 VITALS — BP 131/77 | HR 103 | Ht 62.5 in | Wt 132.0 lb

## 2023-04-06 DIAGNOSIS — H608X3 Other otitis externa, bilateral: Secondary | ICD-10-CM

## 2023-04-06 DIAGNOSIS — H9190 Unspecified hearing loss, unspecified ear: Secondary | ICD-10-CM | POA: Diagnosis not present

## 2023-04-06 DIAGNOSIS — H9203 Otalgia, bilateral: Secondary | ICD-10-CM | POA: Diagnosis not present

## 2023-04-06 MED ORDER — CIPROFLOXACIN-DEXAMETHASONE 0.3-0.1 % OT SUSP: 4.0000 [drp] | Freq: Two times a day (BID) | OTIC | 1 refills | Status: AC

## 2023-04-06 MED ORDER — BETAMETHASONE DIPROPIONATE 0.05 % EX CREA: TOPICAL_CREAM | Freq: Two times a day (BID) | CUTANEOUS | 0 refills | Status: AC

## 2023-04-06 NOTE — Patient Instructions (Signed)
 Use betamethasone ointment -- pea sized amount twice per day for 14 days over right ear canal (just outside) Use Ciprodex 4 drops twice daily for 14 days in right ear Use warm compresses multiple times per day Use ibuprofen 400mg  twice daily No hard or crunchy foods for 21 days

## 2023-04-06 NOTE — Progress Notes (Signed)
 Dear Dr. Katrinka Blazing, Here is my assessment for our mutual patient, Mary Rubio. Thank you for allowing me the opportunity to care for your patient. Please do not hesitate to contact me should you have any other questions. Sincerely, Dr. Jovita Kussmaul  Otolaryngology Clinic Note Referring provider: Dr. Katrinka Blazing HPI:  Mary Rubio is a 78 y.o. female kindly referred by Dr. Katrinka Blazing for evaluation of otalgia.   Initial visit (03/2023): Patient reports: bilateral otalgia (right ear worse), ongoing for the past year or so. Ears also itch significantly. Opening her mouth also hurts. She reports unclear antecedent event, but sometimes also hurts to swallow and also reports some headache. Worse when she puts her finger in the ear and presses on ear canal. Comes and goes, nothing makes it better or worse. No ear drop or abx use. Has seen dentist and does not think it is dental related, no odontalgia. Also reports bilateral non-pulsatile tinnitus, and some high frequency hearing loss and rare popping/crackling. She reports that her hearing may have declined. Does report h/o allergies. No history of chronic sinus infections. No facial pressure/pain, no discolored drainage, no hyposmia.   Denies bruxism, no pain with chewing. No neck pain She saw Dr. Suszanne Conners about a year ago, who got a CT and reported she had bilateral TMJ.   She does report that she has had 5 radiation treatments to the right ear in 1953 for ear infections (reportedly complicated ear infection - with extension into brain)  Patient additionally denies: eustachian tube symptoms such as popping/crackling, sensitive to pressure changes Patient also denies barotrauma, vestibular suppressant use, ototoxic medication use Prior ear surgery: no  Has not tried warm compresses.  H&N Surgery: Septoplasty (1968) Personal or FHx of bleeding dz or anesthesia difficulty: no  Tobacco: no.  Independent Review of Additional Tests or Records:  Dr.  Katrinka Blazing (01/07/2023): noted right ear pain, with sharp pain in right ear ongoing for year; unclear etiology; Dx: Mary Rubio; Rx: Ref ENT CT Face 01/21/2022 independently reviewed and interpreted -- no significant mastoid or ME disease; mastoids and ME well aerated; no encephalocele; no paranasal sinus disease; some TMJ degen; no contrast so no enhancing masses; no parotid masses noted  PMH/Meds/All/SocHx/FamHx/ROS:   Past Medical History:  Diagnosis Date   Allergy    Anxiety    Asthma    Cataract    Colon polyps    COVID-19 07/2020   Depression    Diverticulosis    Environmental allergies    Weekly allergy shots   Eosinophilic esophagitis    Esophageal stricture    Esophagitis    Fall    GERD (gastroesophageal reflux disease)    Hiatal hernia    Hyperlipidemia    Hypothyroidism    IBS (irritable bowel syndrome)    Kidney disease    Osteopenia    Osteoporosis    Skin cancer    Left arm Squamous cell 10/10/2018   Sleep apnea      Past Surgical History:  Procedure Laterality Date   ABDOMINAL HYSTERECTOMY     BARTHOLIN GLAND CYST EXCISION     BUNIONECTOMY Right    CHOLECYSTECTOMY     COLONOSCOPY  2016   ESOPHAGOGASTRODUODENOSCOPY N/A 10/21/2012   Procedure: ESOPHAGOGASTRODUODENOSCOPY (EGD);  Surgeon: Theda Belfast, MD;  Location: [facility];  Service: Endoscopy;  Laterality: N/A;   ESOPHAGOGASTRODUODENOSCOPY N/A 11/02/2017   Procedure: ESOPHAGOGASTRODUODENOSCOPY (EGD);  Surgeon: Beverley Fiedler, MD;  Location: [facility] ENDOSCOPY;  Service: Gastroenterology;  Laterality: N/A;  EXPLORATORY LAPAROTOMY     endometriosis   facial cyst     excision   NASAL SEPTUM SURGERY     SKIN CANCER EXCISION Left 10/10/2018   thumb surgery Left    for cyst    Family History  Problem Relation Age of Onset   Colon polyps Mother    Lung cancer Mother    Hypertension Mother    Thyroid disease Mother    Heart disease Mother    Cancer - Lung Mother    Skin cancer Father    Dementia Father     Arrhythmia Father    Heart disease Father        Several relatives on dad's side have had MIs/CABG   Alzheimer's disease Father    Colon polyps Sister    Heart disease Sister    Multiple myeloma Sister    Breast cancer Maternal Aunt    Heart disease Maternal Grandfather    Heart disease Paternal Grandmother    Thyroid disease Daughter    Colon cancer Neg Hx    Esophageal cancer Neg Hx    Stomach cancer Neg Hx    Rectal cancer Neg Hx    Pancreatic cancer Neg Hx      Social Connections: Unknown (06/02/2021)   Received from [employer], [facility]   Social Network    Social Network: Not on file      Current Outpatient Medications:    albuterol (VENTOLIN HFA) 108 (90 Base) MCG/ACT inhaler, Inhale 2 puffs into the lungs every 6 (six) hours as needed for wheezing or shortness of breath., Disp: 8 g, Rfl: 2   azelastine (ASTELIN) 0.1 % nasal spray, Place into both nostrils 2 (two) times daily. Use in each nostril as directed, Disp: , Rfl:    azelastine (OPTIVAR) 0.05 % ophthalmic solution, Place 1 drop into both eyes daily. , Disp: , Rfl:    betamethasone dipropionate 0.05 % cream, Apply topically 2 (two) times daily for 14 days. Use a pea sized amount twice per day for 14 days, Disp: 15 g, Rfl: 0   cholecalciferol (VITAMIN D) 1000 units tablet, Take 2,000 Units by mouth daily., Disp: , Rfl:    ciprofloxacin-dexamethasone (CIPRODEX) OTIC suspension, Place 4 drops into the right ear 2 (two) times daily for 14 days., Disp: 7.5 mL, Rfl: 1   Cyanocobalamin (VITAMIN B-12) 1000 MCG SUBL, , Disp: , Rfl:    esomeprazole (NEXIUM) 40 MG capsule, TAKE 1 CAPSULE BY MOUTH TWICE DAILY BEFORE A MEAL, Disp: 60 capsule, Rfl: 11   levothyroxine (SYNTHROID) 75 MCG tablet, Take 75 mcg by mouth daily before breakfast., Disp: , Rfl:    Physical Exam:   BP 131/77 (BP Location: Left Arm, Patient Position: Sitting)   Pulse (!) 103   Ht 5' 2.5" (1.588 m)   Wt 132 lb (59.9 kg)   SpO2 96%   BMI 23.76 kg/m    Salient findings:  CN II-XII intact Given history and complaints, ear microscopy was indicated and performed for evaluation with findings as below in physical exam section and in procedures; Bilateral EAC clear and TM intact with well pneumatized middle ear spaces; mild bilateral eczematoid change Weber 512: mid Rinne 512: AC > BC b/l  Anterior rhinoscopy: Septum relatively midline; bilateral inferior turbinates without significant hypertrophy No lesions of oral cavity/oropharynx; mild trismus, bilateral TMJ crepitus No obviously palpable neck masses/lymphadenopathy/thyromegaly No respiratory distress or stridor  Seprately Identifiable Procedures:  Procedure: Bilateral ear microscopy using microscope (CPT 92504)  Pre-procedure diagnosis: bilateral otalgia, eczematoid otitis externa Post-procedure diagnosis: same Indication: see above; given patient's otologic complaints and history, for improved and comprehensive examination of external ear and tympanic membrane, bilateral otologic examination using microscope was performed  Procedure: Patient was placed semi-recumbent. Both ear canals were examined using the microscope with findings above. Patient tolerated the procedure well.   Impression & Plans:  Mary Rubio is a 78 y.o. female with:  1. Otalgia of both ears   2. Chronic eczematous otitis externa of both ears   3. Subjective hearing loss    Does have some TMJ pain it appears, but DDX is wide. Prior CT was reassuring. Also has a fair amount of itching and eczematoid change. We discussed her DDX and can be referred pain, do not think malignancy but could be cervicalgia or TMJ related pain. Will treat for it and get an audio for HL and follow up. If does not resolve, can consider contrasted CT (prior was non-con)  - ciprodex BID x14d right ear - Betamethasone BID right ear canal - Warm compresses - NSAIDs - soft diet x3 weeks - f/u 6 weeks  See below regarding exact  medications prescribed this encounter including dosages and route: Meds ordered this encounter  Medications   ciprofloxacin-dexamethasone (CIPRODEX) OTIC suspension    Sig: Place 4 drops into the right ear 2 (two) times daily for 14 days.    Dispense:  7.5 mL    Refill:  1   betamethasone dipropionate 0.05 % cream    Sig: Apply topically 2 (two) times daily for 14 days. Use a pea sized amount twice per day for 14 days    Dispense:  15 g    Refill:  0      Thank you for allowing me the opportunity to care for your patient. Please do not hesitate to contact me should you have any other questions.  Sincerely, Jovita Kussmaul, MD Otolaryngologist (ENT), [facility] Phone: [phone] Fax: [phone]  04/06/2023, 2:27 PM   MDM:  Level 4 Complexity/Problems addressed: mod - multiple chronic problems Data complexity: mod - independent review of notes; ordering tests; independent interpretation of CT imaging - Morbidity: mod  - Prescription Drug prescribed or managed: yes

## 2023-05-19 DIAGNOSIS — E7800 Pure hypercholesterolemia, unspecified: Secondary | ICD-10-CM | POA: Diagnosis not present

## 2023-05-19 DIAGNOSIS — E039 Hypothyroidism, unspecified: Secondary | ICD-10-CM | POA: Diagnosis not present

## 2023-05-19 DIAGNOSIS — F411 Generalized anxiety disorder: Secondary | ICD-10-CM | POA: Diagnosis not present

## 2023-05-19 DIAGNOSIS — N1831 Chronic kidney disease, stage 3a: Secondary | ICD-10-CM | POA: Diagnosis not present

## 2023-05-21 ENCOUNTER — Ambulatory Visit (INDEPENDENT_AMBULATORY_CARE_PROVIDER_SITE_OTHER): Admitting: Audiology

## 2023-05-21 ENCOUNTER — Ambulatory Visit (INDEPENDENT_AMBULATORY_CARE_PROVIDER_SITE_OTHER)

## 2023-05-24 DIAGNOSIS — J3089 Other allergic rhinitis: Secondary | ICD-10-CM | POA: Diagnosis not present

## 2023-05-24 DIAGNOSIS — H6693 Otitis media, unspecified, bilateral: Secondary | ICD-10-CM | POA: Diagnosis not present

## 2023-05-24 DIAGNOSIS — K200 Eosinophilic esophagitis: Secondary | ICD-10-CM | POA: Diagnosis not present

## 2023-05-24 DIAGNOSIS — R0602 Shortness of breath: Secondary | ICD-10-CM | POA: Diagnosis not present

## 2023-05-29 DIAGNOSIS — M26629 Arthralgia of temporomandibular joint, unspecified side: Secondary | ICD-10-CM | POA: Diagnosis not present

## 2023-06-10 DIAGNOSIS — R92323 Mammographic fibroglandular density, bilateral breasts: Secondary | ICD-10-CM | POA: Diagnosis not present

## 2023-06-10 DIAGNOSIS — Z1231 Encounter for screening mammogram for malignant neoplasm of breast: Secondary | ICD-10-CM | POA: Diagnosis not present

## 2023-06-21 DIAGNOSIS — H5213 Myopia, bilateral: Secondary | ICD-10-CM | POA: Diagnosis not present

## 2023-06-21 DIAGNOSIS — H35373 Puckering of macula, bilateral: Secondary | ICD-10-CM | POA: Diagnosis not present

## 2023-06-21 DIAGNOSIS — H2513 Age-related nuclear cataract, bilateral: Secondary | ICD-10-CM | POA: Diagnosis not present

## 2023-06-28 DIAGNOSIS — K134 Granuloma and granuloma-like lesions of oral mucosa: Secondary | ICD-10-CM | POA: Diagnosis not present

## 2023-06-30 DIAGNOSIS — K134 Granuloma and granuloma-like lesions of oral mucosa: Secondary | ICD-10-CM | POA: Diagnosis not present

## 2023-07-08 NOTE — Progress Notes (Unsigned)
  [address] [place], [place] [address] [phone]  Audiological Evaluation    Name: Mary Rubio     DOB:   08/08/1945      MRN:   [id number]                                                                                     Service Date: 07/08/2023     Accompanied by: ***   Patient comes today after Dr. Lydia Sams, ENT sent a referral for a hearing evaluation due to concerns with ear pain.   Symptoms Yes Details  Hearing loss  []    Tinnitus  []    Ear pain/ infections/pressure  []  ***  Balance problems  []    Noise exposure history  []    Previous ear surgeries  []    Family history of hearing loss  []    Amplification  []    Other  []      Otoscopy: Right ear: {otoscopy:31227} Left ear:  {otoscopy:31227}  Tympanometry: Right ear: {tympanometry results:31367}. Left ear: {tympanometry results:31367}.  Pure tone Audiometry: Right ear- *** {hearing loss types:31372::sensorineural hearing loss} from *** Hz - *** Hz. Left ear-  *** {hearing loss types:31372::sensorineural hearing loss} from *** Hz - *** Hz.  Speech Audiometry: Right ear- Speech Reception Threshold (SRT) was obtained at *** dBHL. Left ear-Speech Reception Threshold (SRT) was obtained at *** dBHL.   Word Recognition Score Tested using NU-6 (recorded) Right ear: ***% was obtained at a presentation level of *** dBHL with contralateral masking which is deemed as  {word recognition score:31373}. Left ear: ***% was obtained at a presentation level of *** dBHL with contralateral masking which is deemed as  {word recognition score:31373}.   The hearing test results were completed {transducer options:31388} and results are deemed to be of {test reliability:31390::good reliability}. Test technique:  conventional     Recommendations: {Audiology Recommendations:31370::Follow up with ENT as scheduled for today.}   Maureen Delatte MARIE LEROUX-MARTINEZ, AUD

## 2023-07-09 ENCOUNTER — Ambulatory Visit (INDEPENDENT_AMBULATORY_CARE_PROVIDER_SITE_OTHER): Admitting: Otolaryngology

## 2023-07-09 ENCOUNTER — Encounter (INDEPENDENT_AMBULATORY_CARE_PROVIDER_SITE_OTHER): Payer: Self-pay | Admitting: Otolaryngology

## 2023-07-09 ENCOUNTER — Ambulatory Visit (INDEPENDENT_AMBULATORY_CARE_PROVIDER_SITE_OTHER): Admitting: Audiology

## 2023-07-09 VITALS — BP 149/74 | HR 83 | Ht 62.5 in | Wt 134.0 lb

## 2023-07-09 DIAGNOSIS — H608X3 Other otitis externa, bilateral: Secondary | ICD-10-CM

## 2023-07-09 DIAGNOSIS — H903 Sensorineural hearing loss, bilateral: Secondary | ICD-10-CM

## 2023-07-09 DIAGNOSIS — M26623 Arthralgia of bilateral temporomandibular joint: Secondary | ICD-10-CM

## 2023-07-09 DIAGNOSIS — H9203 Otalgia, bilateral: Secondary | ICD-10-CM | POA: Diagnosis not present

## 2023-07-09 NOTE — Progress Notes (Signed)
 Dear Dr. Claudene, Here is my assessment for our mutual patient, Mary Rubio. Thank you for allowing me the opportunity to care for your patient. Please do not hesitate to contact me should you have any other questions. Sincerely, Dr. Eldora Blanch  Otolaryngology Clinic Note Referring provider: Dr. Claudene HPI:  Mary Rubio is a 78 y.o. female kindly referred by Dr. Claudene for evaluation of otalgia.   Initial visit (03/2023): Patient reports: bilateral otalgia (right ear much worse), ongoing for the past year or so. Ears also itch significantly. Opening her mouth also hurts. She reports unclear antecedent event, but sometimes also hurts to swallow and also reports some headache. Worse when she puts her finger in the ear and presses on ear canal. Comes and goes, nothing makes it better or worse. No ear drop or abx use. Has seen dentist and does not think it is dental related, no odontalgia. Also reports bilateral non-pulsatile tinnitus, and some high frequency hearing loss and rare popping/crackling. She reports that her hearing may have declined. Does report h/o allergies. No history of chronic sinus infections. No facial pressure/pain, no discolored drainage, no hyposmia.   Denies bruxism, no pain with chewing. No neck pain She saw Dr. Karis about a year ago, who got a CT and reported she had bilateral TMJ.   She does report that she has had 5 radiation treatments to the right ear in 1953 for ear infections (reportedly complicated ear infection - with extension into brain)  Patient additionally denies: eustachian tube symptoms such as popping/crackling, sensitive to pressure changes Patient also denies barotrauma, vestibular suppressant use, ototoxic medication use Prior ear surgery: no  Has not tried warm compresses.  --------------------------------------------------------- 07/09/2023 Itching is much better. Did have a sinus infection and saw Allergy, given abx/steroids with  improvement. No nasal symptoms. Continues to have right ear pain and discomfort, which is some better with regular warm compresses. Pressing the ear canal worse.  No odynophagia, no headache now. Of note, she does report that chewing hard foods causes pain, and she reports that she some masseter and temporalis pain as well. Pain radiates down the jaw. No neck masses. The pain is preauricular, not in the ear.  Patient otherwise denies: - dysphagia, odynophagia, unintentional weight loss - changes in voice, neck masses  She also has had recent biopsy of a right buccal lesion (excision, reportedly benign) by oral surgery, who has discussed with her about TMJ therapy and scans. She did have an audiogram today   H&N Surgery: Septoplasty (1968) Personal or FHx of bleeding dz or anesthesia difficulty: no  Tobacco: no.  Independent Review of Additional Tests or Records:  Dr. Claudene (01/07/2023): noted right ear pain, with sharp pain in right ear ongoing for year; unclear etiology; Dx: Riley; Rx: Ref ENT CT Face 01/21/2022 independently reviewed and interpreted -- no significant mastoid or ME disease; mastoids and ME well aerated; no encephalocele; no paranasal sinus disease; some TMJ degen; no contrast so no enhancing masses; no parotid masses noted 06/2023 Audiogram was independently reviewed and interpreted by me and it reveals - b/l downsloping symmetric SNHL AU as below; WRT 100% at 65dB AU; A/A tymps   SNHL= Sensorineural hearing loss   PMH/Meds/All/SocHx/FamHx/ROS:   Past Medical History:  Diagnosis Date   Allergy    Anxiety    Asthma    Cataract    Colon polyps    COVID-19 07/2020   Depression    Diverticulosis    Environmental allergies  Weekly allergy shots   Eosinophilic esophagitis    Esophageal stricture    Esophagitis    Fall    GERD (gastroesophageal reflux disease)    Hiatal hernia    Hyperlipidemia    Hypothyroidism    IBS (irritable bowel syndrome)    Kidney  disease    Osteopenia    Osteoporosis    Skin cancer    Left arm Squamous cell 10/10/2018   Sleep apnea      Past Surgical History:  Procedure Laterality Date   ABDOMINAL HYSTERECTOMY     BARTHOLIN GLAND CYST EXCISION     BUNIONECTOMY Right    CHOLECYSTECTOMY     COLONOSCOPY  2016   ESOPHAGOGASTRODUODENOSCOPY N/A 10/21/2012   Procedure: ESOPHAGOGASTRODUODENOSCOPY (EGD);  Surgeon: Belvie JONETTA Just, MD;  Location: [facility];  Service: Endoscopy;  Laterality: N/A;   ESOPHAGOGASTRODUODENOSCOPY N/A 11/02/2017   Procedure: ESOPHAGOGASTRODUODENOSCOPY (EGD);  Surgeon: Albertus Gordy HERO, MD;  Location: [facility] ENDOSCOPY;  Service: Gastroenterology;  Laterality: N/A;   EXPLORATORY LAPAROTOMY     endometriosis   facial cyst     excision   NASAL SEPTUM SURGERY     SKIN CANCER EXCISION Left 10/10/2018   thumb surgery Left    for cyst    Family History  Problem Relation Age of Onset   Colon polyps Mother    Lung cancer Mother    Hypertension Mother    Thyroid  disease Mother    Heart disease Mother    Cancer - Lung Mother    Skin cancer Father    Dementia Father    Arrhythmia Father    Heart disease Father        Several relatives on dad's side have had MIs/CABG   Alzheimer's disease Father    Colon polyps Sister    Heart disease Sister    Multiple myeloma Sister    Breast cancer Maternal Aunt    Heart disease Maternal Grandfather    Heart disease Paternal Grandmother    Thyroid  disease Daughter    Colon cancer Neg Hx    Esophageal cancer Neg Hx    Stomach cancer Neg Hx    Rectal cancer Neg Hx    Pancreatic cancer Neg Hx      Social Connections: Unknown (06/02/2021)   Received from [employer]   Social Network    Social Network: Not on file      Current Outpatient Medications:    albuterol  (VENTOLIN  HFA) 108 (90 Base) MCG/ACT inhaler, Inhale 2 puffs into the lungs every 6 (six) hours as needed for wheezing or shortness of breath., Disp: 8 g, Rfl: 2   azelastine (ASTELIN) 0.1  % nasal spray, Place into both nostrils 2 (two) times daily. Use in each nostril as directed, Disp: , Rfl:    azelastine (OPTIVAR) 0.05 % ophthalmic solution, Place 1 drop into both eyes daily. , Disp: , Rfl:    cholecalciferol (VITAMIN D ) 1000 units tablet, Take 2,000 Units by mouth daily., Disp: , Rfl:    Cyanocobalamin  (VITAMIN B-12) 1000 MCG SUBL, , Disp: , Rfl:    esomeprazole  (NEXIUM ) 40 MG capsule, TAKE 1 CAPSULE BY MOUTH TWICE DAILY BEFORE A MEAL, Disp: 60 capsule, Rfl: 11   levothyroxine  (SYNTHROID ) 75 MCG tablet, Take 75 mcg by mouth daily before breakfast., Disp: , Rfl:    traMADol (ULTRAM) 50 MG tablet, Take 50 mg by mouth every 6 (six) hours as needed., Disp: , Rfl:    Physical Exam:   BP (!) 149/74 (  BP Location: Right Arm, Patient Position: Sitting, Cuff Size: Normal)   Pulse 83   Ht 5' 2.5 (1.588 m)   Wt 134 lb (60.8 kg)   SpO2 95%   BMI 24.12 kg/m   Salient findings:  CN II-XII intact Bilateral EAC clear and TM intact with well pneumatized middle ear spaces; mild bilateral eczematoid change Weber 512: mid Rinne 512: AC > BC b/l  Anterior rhinoscopy: Septum relatively midline; bilateral inferior turbinates without significant hypertrophy No lesions of oral cavity/oropharynx except well healed right buccal biopsy area (presumed); no worrisome lesions noted; mild trismus, bilateral TMJ crepitus;  reproducible pain to palpation right TMJ area, with pain to palpation while mouth open to right temporalis and right masseter. No obviously palpable neck masses/lymphadenopathy/thyromegaly No respiratory distress or stridor  Seprately Identifiable Procedures:  None today   Impression & Plans:  Mary Rubio is a 78 y.o. female with:  1. Sensorineural hearing loss (SNHL) of both ears   2. Otalgia of both ears   3. Chronic eczematous otitis externa of both ears   4. Bilateral temporomandibular joint pain    Does have some TMJ pain it appears based on available  evidence and reproducibility. Prior CT was reassuring but non-con. We discussed a contrasted CT to rule out any occult masses but she declined, which is reasonable. At this point, did advise that she should discuss with her oral surgeon and dentist regarding TMJ PT.  Eczematoid OE: resolved. SNHL: wear appropriate hearing protection in loud environments; would recheck hearing if subjective decline  - continue soft diet, NSAIDs PRN and warm compresses - Return if new or worsening symptoms or if she wishes to pursue contrasted CT  See below regarding exact medications prescribed this encounter including dosages and route: No orders of the defined types were placed in this encounter.   Thank you for allowing me the opportunity to care for your patient. Please do not hesitate to contact me should you have any other questions.  Sincerely, Eldora Blanch, MD Otolaryngologist (ENT), [facility] Phone: [phone] Fax: [phone]  07/10/2023, 10:53 AM   I have personally spent 31 minutes involved in face-to-face and non-face-to-face activities for this patient on the day of the visit.  Professional time spent excludes any procedures performed but includes the following activities, in addition to those noted in the documentation: preparing to see the patient (review of outside documentation and results), performing a medically appropriate examination, counseling, documenting in the electronic health record

## 2023-07-09 NOTE — Progress Notes (Deleted)
  [address], [address] [place], [place] [address] [phone]  Audiological Evaluation    Name: Mary Rubio     DOB:   12-Jun-1945      MRN:   [id number]                                                                                     Service Date: 07/09/2023     Accompanied by: unaccompanied   Patient comes today after Dr. Lydia Sams, ENT sent a referral for a hearing evaluation due to concerns with ear pain.   Symptoms Yes Details  Hearing loss  []  Recalls that during a previous test was told to have some high frequency hearing loss  Tinnitus  [x]  Both ears  Ear pain/ infections/pressure  [x]  Right ear pain; recurrent ear infections, per pt  Balance problems  []    Noise exposure history  []    Previous ear surgeries  []    Family history of hearing loss  []    Amplification  []    Other  []      Otoscopy: Right ear: Clear external ear canal and notable landmarks visualized on the tympanic membrane. Left ear:  Clear external ear canal and notable landmarks visualized on the tympanic membrane.  Tympanometry: Right ear: Type A- Normal external ear canal volume with normal middle ear pressure and tympanic membrane compliance. Left ear: Type A- Normal external ear canal volume with normal middle ear pressure and tympanic membrane compliance.  Pure tone Audiometry: Right ear- Normal hearing from [phone], then moderately severe to severe presumably sensorineural hearing loss from 6000 Hz - 8000 Hz. Left ear-  Normal hearing from [phone], then mild to  severe presumably sensorineural hearing loss from 6000 Hz - 8000 Hz.  Speech Audiometry: Right ear- Speech Reception Threshold (SRT) was obtained at 20 dBHL. Left ear-Speech Reception Threshold (SRT) was obtained at 20 dBHL.   Word Recognition Score Tested using NU-6 (recorded) Right ear: 100% was obtained at a presentation level of 65 dBHL with contralateral masking which is deemed as  excellent. Left ear: 100%  was obtained at a presentation level of 65 dBHL with contralateral masking which is deemed as  excellent.   The hearing test results were completed under headphones and results are deemed to be of good reliability. Test technique:  conventional      Recommendations: Follow up with ENT as scheduled for today., Return for a hearing evaluation if concerns with hearing changes arise or per MD recommendation.   Genesi Stefanko MARIE LEROUX-MARTINEZ, AUD

## 2023-07-15 DIAGNOSIS — H2511 Age-related nuclear cataract, right eye: Secondary | ICD-10-CM | POA: Diagnosis not present

## 2023-07-15 DIAGNOSIS — Z961 Presence of intraocular lens: Secondary | ICD-10-CM | POA: Diagnosis not present

## 2023-07-15 DIAGNOSIS — H25811 Combined forms of age-related cataract, right eye: Secondary | ICD-10-CM | POA: Diagnosis not present

## 2023-07-20 ENCOUNTER — Ambulatory Visit: Admitting: Physician Assistant

## 2023-08-02 DIAGNOSIS — D485 Neoplasm of uncertain behavior of skin: Secondary | ICD-10-CM | POA: Diagnosis not present

## 2023-08-02 DIAGNOSIS — L820 Inflamed seborrheic keratosis: Secondary | ICD-10-CM | POA: Diagnosis not present

## 2023-08-12 DIAGNOSIS — Z961 Presence of intraocular lens: Secondary | ICD-10-CM | POA: Diagnosis not present

## 2023-08-12 DIAGNOSIS — H2512 Age-related nuclear cataract, left eye: Secondary | ICD-10-CM | POA: Diagnosis not present

## 2023-08-29 ENCOUNTER — Emergency Department (HOSPITAL_COMMUNITY)

## 2023-08-29 ENCOUNTER — Other Ambulatory Visit: Payer: Self-pay

## 2023-08-29 ENCOUNTER — Encounter (HOSPITAL_COMMUNITY): Payer: Self-pay | Admitting: *Deleted

## 2023-08-29 ENCOUNTER — Emergency Department (HOSPITAL_COMMUNITY)
Admission: EM | Admit: 2023-08-29 | Discharge: 2023-08-29 | Disposition: A | Discharge status: Home / Self Care | Attending: Emergency Medicine | Admitting: Emergency Medicine

## 2023-08-29 DIAGNOSIS — R9431 Abnormal electrocardiogram [ECG] [EKG]: Secondary | ICD-10-CM | POA: Diagnosis not present

## 2023-08-29 DIAGNOSIS — Z91040 Latex allergy status: Secondary | ICD-10-CM | POA: Insufficient documentation

## 2023-08-29 DIAGNOSIS — Z79899 Other long term (current) drug therapy: Secondary | ICD-10-CM | POA: Diagnosis not present

## 2023-08-29 DIAGNOSIS — Z0189 Encounter for other specified special examinations: Secondary | ICD-10-CM | POA: Diagnosis not present

## 2023-08-29 DIAGNOSIS — I700 Atherosclerosis of aorta: Secondary | ICD-10-CM | POA: Diagnosis not present

## 2023-08-29 DIAGNOSIS — E039 Hypothyroidism, unspecified: Secondary | ICD-10-CM | POA: Diagnosis not present

## 2023-08-29 DIAGNOSIS — R519 Headache, unspecified: Secondary | ICD-10-CM | POA: Insufficient documentation

## 2023-08-29 LAB — COMPREHENSIVE METABOLIC PANEL WITH GFR
ALT: 12 U/L (ref 0–44)
AST: 19 U/L (ref 15–41)
Albumin: 4 g/dL (ref 3.5–5.0)
Alkaline Phosphatase: 56 U/L (ref 38–126)
Anion gap: 14 (ref 5–15)
BUN: 8 mg/dL (ref 8–23)
CO2: 23 mmol/L (ref 22–32)
Calcium: 9.7 mg/dL (ref 8.9–10.3)
Chloride: 105 mmol/L (ref 98–111)
Creatinine, Ser: 1.01 mg/dL — ABNORMAL HIGH (ref 0.44–1.00)
GFR, Estimated: 58 mL/min — ABNORMAL LOW (ref >60)
Glucose, Bld: 127 mg/dL — ABNORMAL HIGH (ref 70–99)
Potassium: 3.9 mmol/L (ref 3.5–5.1)
Sodium: 142 mmol/L (ref 135–145)
Total Bilirubin: 0.4 mg/dL (ref 0.0–1.2)
Total Protein: 7.1 g/dL (ref 6.5–8.1)

## 2023-08-29 LAB — CBC WITH DIFFERENTIAL/PLATELET
Abs Immature Granulocytes: 0.04 K/uL (ref 0.00–0.07)
Basophils Absolute: 0.1 K/uL (ref 0.0–0.1)
Basophils Relative: 1 %
Eosinophils Absolute: 0.1 K/uL (ref 0.0–0.5)
Eosinophils Relative: 1 %
HCT: 40.2 % (ref 36.0–46.0)
Hemoglobin: 13.8 g/dL (ref 12.0–15.0)
Immature Granulocytes: 1 %
Lymphocytes Relative: 26 %
Lymphs Abs: 2 K/uL (ref 0.7–4.0)
MCH: 32 pg (ref 26.0–34.0)
MCHC: 34.3 g/dL (ref 30.0–36.0)
MCV: 93.3 fL (ref 80.0–100.0)
Monocytes Absolute: 0.6 K/uL (ref 0.1–1.0)
Monocytes Relative: 8 %
Neutro Abs: 4.9 K/uL (ref 1.7–7.7)
Neutrophils Relative %: 63 %
Platelets: 205 K/uL (ref 150–400)
RBC: 4.31 MIL/uL (ref 3.87–5.11)
RDW: 11.9 % (ref 11.5–15.5)
WBC: 7.6 K/uL (ref 4.0–10.5)
nRBC: 0 % (ref 0.0–0.2)

## 2023-08-29 LAB — T4, FREE: Free T4: 1.27 ng/dL — ABNORMAL HIGH (ref 0.61–1.12)

## 2023-08-29 LAB — TSH: TSH: 1.351 u[IU]/mL (ref 0.350–4.500)

## 2023-08-29 LAB — TROPONIN I (HIGH SENSITIVITY)
Troponin I (High Sensitivity): 4 ng/L (ref <18)
Troponin I (High Sensitivity): 4 ng/L (ref <18)

## 2023-08-29 LAB — SEDIMENTATION RATE: Sed Rate: 20 mm/h (ref 0–22)

## 2023-08-29 LAB — C-REACTIVE PROTEIN: CRP: <0.5 mg/dL (ref <1.0)

## 2023-08-29 LAB — LIPASE, BLOOD: Lipase: 35 U/L (ref 11–51)

## 2023-08-29 MED ORDER — OXYCODONE-ACETAMINOPHEN 5-325 MG PO TABS
ORAL_TABLET | ORAL | Status: AC
  Filled 2023-08-29: qty 1

## 2023-08-29 MED ORDER — DIPHENHYDRAMINE HCL 50 MG/ML IJ SOLN
25.0000 mg | Freq: Once | INTRAMUSCULAR | Status: AC
  Administered 2023-08-29: 25 mg via INTRAVENOUS
  Filled 2023-08-29: qty 1

## 2023-08-29 MED ORDER — IOHEXOL 350 MG/ML SOLN
75.0000 mL | Freq: Once | INTRAVENOUS | Status: AC | PRN
  Administered 2023-08-29: 75 mL via INTRAVENOUS

## 2023-08-29 MED ORDER — PROCHLORPERAZINE MALEATE 10 MG PO TABS: 10.0000 mg | ORAL_TABLET | Freq: Three times a day (TID) | ORAL | 0 refills | Status: DC | PRN

## 2023-08-29 MED ORDER — METOCLOPRAMIDE HCL 5 MG/ML IJ SOLN
10.0000 mg | Freq: Once | INTRAMUSCULAR | Status: AC
  Administered 2023-08-29: 10 mg via INTRAVENOUS
  Filled 2023-08-29: qty 2

## 2023-08-29 MED ORDER — METOPROLOL TARTRATE 5 MG/5ML IV SOLN: 5.0000 mg | Freq: Once | INTRAVENOUS | Status: DC

## 2023-08-29 MED ORDER — SODIUM CHLORIDE 0.9 % IV BOLUS
1000.0000 mL | Freq: Once | INTRAVENOUS | Status: AC
  Administered 2023-08-29: 1000 mL via INTRAVENOUS

## 2023-08-29 MED ORDER — METHYLPREDNISOLONE SODIUM SUCC 125 MG IJ SOLR
125.0000 mg | Freq: Once | INTRAMUSCULAR | Status: AC
  Administered 2023-08-29: 125 mg via INTRAVENOUS
  Filled 2023-08-29: qty 2

## 2023-08-29 MED ORDER — OXYCODONE-ACETAMINOPHEN 5-325 MG PO TABS
1.0000 | ORAL_TABLET | Freq: Once | ORAL | Status: AC
  Administered 2023-08-29: 1 via ORAL

## 2023-08-29 NOTE — ED Provider Notes (Signed)
 [place] EMERGENCY DEPARTMENT AT [facility] Provider Note   CSN: [id number] Arrival date & time: 08/29/23  1729     Patient presents with: Eye Problem   HARPER VANDERVOORT is a 78 y.o. female history of hypothyroidism, here presenting with multiple symptoms.  Patient has been having night sweats and palpitations for the last several weeks.  Patient had cataract surgery done about 2 weeks ago.  Patient then noticed left-sided headache and also left eye pain.  Patient called ophthalmology and was seen by Dr. Waylan this afternoon.  Patient had normal vision and normal eye pressure.  No obvious optic neuritis on exam per ophthalmology note.  However patient does have tenderness over the left temple area.  They were concerned for possible temporal arteritis.  Patient no has a history of lupus previously.    The history is provided by the patient.       Prior to Admission medications   Medication Sig Start Date End Date Taking? Authorizing Provider  albuterol  (VENTOLIN  HFA) 108 (90 Base) MCG/ACT inhaler Inhale 2 puffs into the lungs every 6 (six) hours as needed for wheezing or shortness of breath. 07/29/20   Sofia, Leslie K, PA-C  azelastine (ASTELIN) 0.1 % nasal spray Place into both nostrils 2 (two) times daily. Use in each nostril as directed    [provider]  azelastine (OPTIVAR) 0.05 % ophthalmic solution Place 1 drop into both eyes daily.     [provider]  cholecalciferol (VITAMIN D ) 1000 units tablet Take 2,000 Units by mouth daily.    [provider]  Cyanocobalamin  (VITAMIN B-12) 1000 MCG SUBL  10/19/20   [provider]  esomeprazole  (NEXIUM ) 40 MG capsule TAKE 1 CAPSULE BY MOUTH TWICE DAILY BEFORE A MEAL 01/22/23   Pyrtle, Gordy HERO, MD  levothyroxine  (SYNTHROID ) 75 MCG tablet Take 75 mcg by mouth daily before breakfast.    [provider]  traMADol (ULTRAM) 50 MG tablet Take 50 mg by mouth every 6 (six) hours as needed.  06/30/23   [provider]    Allergies: Cymbalta [duloxetine hcl], Augmentin [amoxicillin -pot clavulanate], Latex, Rosuvastatin, Codeine, Demerol [meperidine], Meperidine hcl, Morphine and codeine, Pentazocine lactate, Simvastatin, and Talwin [pentazocine]    Review of Systems  Neurological:  Positive for dizziness and headaches.  All other systems reviewed and are negative.   Updated Vital Signs BP (!) 149/74   Pulse 93   Temp 98.4 F (36.9 C) (Oral)   Resp 16   Ht 5' 5 (1.651 m)   Wt 60.8 kg   SpO2 100%   BMI 22.31 kg/m   Physical Exam Vitals and nursing note reviewed.  Constitutional:      Comments: Chronically ill and slightly uncomfortable  HENT:     Head: Normocephalic.     Comments: Patient does have some dilated arteries in the left temporal area.  Mild left temporal tenderness    Nose: Nose normal.     Mouth/Throat:     Mouth: Mucous membranes are moist.  Eyes:     Extraocular Movements: Extraocular movements intact.     Pupils: Pupils are equal, round, and reactive to light.  Cardiovascular:     Rate and Rhythm: Normal rate.     Pulses: Normal pulses.     Heart sounds: Normal heart sounds.  Pulmonary:     Effort: Pulmonary effort is normal.     Breath sounds: Normal breath sounds.  Abdominal:     General: Abdomen is flat.  Palpations: Abdomen is soft.  Musculoskeletal:        General: Normal range of motion.     Cervical back: Normal range of motion and neck supple.  Skin:    General: Skin is warm.     Capillary Refill: Capillary refill takes less than 2 seconds.  Neurological:     General: No focal deficit present.     Mental Status: She is oriented to person, place, and time.     Comments: Normal strength and sensation bilateral arms and legs.  No facial droop.  Psychiatric:        Mood and Affect: Mood normal.        Behavior: Behavior normal.     (all labs ordered are listed, but only abnormal results are displayed) Labs  Reviewed  COMPREHENSIVE METABOLIC PANEL WITH GFR - Abnormal; Notable for the following components:      Result Value   Glucose, Bld 127 (*)    Creatinine, Ser 1.01 (*)    GFR, Estimated 58 (*)    All other components within normal limits  LIPASE, BLOOD  CBC WITH DIFFERENTIAL/PLATELET  SEDIMENTATION RATE  C-REACTIVE PROTEIN  TSH  T4, FREE  TROPONIN I (HIGH SENSITIVITY)    EKG: EKG Interpretation Date/Time:  Sunday August 29 2023 19:34:09 EDT Ventricular Rate:  83 PR Interval:  160 QRS Duration:  91 QT Interval:  362 QTC Calculation: 426 R Axis:   39  Text Interpretation: Sinus rhythm No significant change since last tracing Confirmed by Patt Alm DEL [phone]) on 08/29/2023 7:41:22 PM  Radiology: No results found.   Procedures   Angiocath insertion Performed by: Alm DEL Patt  Consent: Verbal consent obtained. Risks and benefits: risks, benefits and alternatives were discussed Time out: Immediately prior to procedure a time out was called to verify the correct patient, procedure, equipment, support staff and site/side marked as required.  Preparation: Patient was prepped and draped in the usual sterile fashion.  Vein Location: R antecube   Ultrasound Guided  Gauge: 20 long   Normal blood return and flush without difficulty Patient tolerance: Patient tolerated the procedure well with no immediate complications.    Medications Ordered in the ED  metoCLOPramide  (REGLAN ) injection 10 mg (has no administration in time range)  diphenhydrAMINE  (BENADRYL ) injection 25 mg (has no administration in time range)  methylPREDNISolone  sodium succinate (SOLU-MEDROL ) 125 mg/2 mL injection 125 mg (has no administration in time range)  metoprolol  tartrate (LOPRESSOR ) injection 5 mg (has no administration in time range)  sodium chloride  0.9 % bolus 1,000 mL (has no administration in time range)  oxyCODONE -acetaminophen  (PERCOCET/ROXICET) 5-325 MG per tablet 1 tablet (1 tablet Oral  Given 08/29/23 1804)                                    Medical Decision Making JAIANA SHEFFER is a 78 y.o. female here presenting with left-sided headache and also left temporal tenderness.  Concern for possible temporal arteritis.  Will get CTA head and neck and also ESR and CRP and CBC and CMP.  Will give migraine cocktail as well.  Also consider hypothyroidism so we will check a TSH as well.  10:40 PM Reviewed patient's labs.  Patient's TSH is normal.  Troponin negative x 2.  ESR and CRP are normal.  11:25 PM CTA unremarkable.  Will discharge home with Compazine  as needed and neurology follow-up  Problems Addressed: Nonintractable  headache, unspecified chronicity pattern, unspecified headache type: acute illness or injury  Amount and/or Complexity of Data Reviewed Labs: ordered. Decision-making details documented in ED Course. Radiology: ordered and independent interpretation performed. Decision-making details documented in ED Course. ECG/medicine tests: ordered.  Risk Prescription drug management.     Final diagnoses:  None    ED Discharge Orders     None          Patt Alm Macho, MD 08/29/23 2326

## 2023-08-29 NOTE — ED Notes (Signed)
 Awaiting patient from lobby.

## 2023-08-29 NOTE — ED Notes (Signed)
 The pts eye doctor sent in lab tests he needed dr. Charlett

## 2023-08-29 NOTE — Discharge Instructions (Addendum)
 As we discussed, your inflammatory markers are normal and do not see any obvious aneurysms or blood clots  I have prescribed Compazine  as needed for headache  I have referred you to [facility] neurology for follow-up.  If they do not call you by the end of the week please call them  Return to ER if you have worse headaches or vomiting or eye pain or vision changes

## 2023-08-29 NOTE — ED Triage Notes (Signed)
 The pt had  lt eye pain since Friday she had cataract  surgery in that eye  July 24th her lt temple is the pain area  she saw her eye doctor and told her that she had no problem with the past surgery he has directions on a piece of paper for suggestions of treatment

## 2023-08-31 DIAGNOSIS — J019 Acute sinusitis, unspecified: Secondary | ICD-10-CM | POA: Diagnosis not present

## 2023-08-31 DIAGNOSIS — R0981 Nasal congestion: Secondary | ICD-10-CM | POA: Diagnosis not present

## 2023-08-31 DIAGNOSIS — U071 COVID-19: Secondary | ICD-10-CM | POA: Diagnosis not present

## 2023-08-31 DIAGNOSIS — R21 Rash and other nonspecific skin eruption: Secondary | ICD-10-CM | POA: Diagnosis not present

## 2023-08-31 DIAGNOSIS — R051 Acute cough: Secondary | ICD-10-CM | POA: Diagnosis not present

## 2023-09-13 DIAGNOSIS — J0111 Acute recurrent frontal sinusitis: Secondary | ICD-10-CM | POA: Diagnosis not present

## 2023-09-13 DIAGNOSIS — R5383 Other fatigue: Secondary | ICD-10-CM | POA: Diagnosis not present

## 2023-09-13 DIAGNOSIS — R0602 Shortness of breath: Secondary | ICD-10-CM | POA: Diagnosis not present

## 2023-09-13 DIAGNOSIS — R051 Acute cough: Secondary | ICD-10-CM | POA: Diagnosis not present

## 2023-09-13 DIAGNOSIS — R062 Wheezing: Secondary | ICD-10-CM | POA: Diagnosis not present

## 2023-09-14 DIAGNOSIS — J209 Acute bronchitis, unspecified: Secondary | ICD-10-CM | POA: Diagnosis not present

## 2023-09-14 DIAGNOSIS — T368X5A Adverse effect of other systemic antibiotics, initial encounter: Secondary | ICD-10-CM | POA: Diagnosis not present

## 2023-09-14 DIAGNOSIS — J019 Acute sinusitis, unspecified: Secondary | ICD-10-CM | POA: Diagnosis not present

## 2023-09-15 DIAGNOSIS — F419 Anxiety disorder, unspecified: Secondary | ICD-10-CM | POA: Diagnosis not present

## 2023-09-15 DIAGNOSIS — T50905A Adverse effect of unspecified drugs, medicaments and biological substances, initial encounter: Secondary | ICD-10-CM | POA: Diagnosis not present

## 2023-09-18 DIAGNOSIS — U099 Post covid-19 condition, unspecified: Secondary | ICD-10-CM | POA: Diagnosis not present

## 2023-09-18 DIAGNOSIS — R519 Headache, unspecified: Secondary | ICD-10-CM | POA: Diagnosis not present

## 2023-09-23 DIAGNOSIS — R519 Headache, unspecified: Secondary | ICD-10-CM | POA: Diagnosis not present

## 2023-09-25 DIAGNOSIS — J0111 Acute recurrent frontal sinusitis: Secondary | ICD-10-CM | POA: Diagnosis not present

## 2023-09-25 DIAGNOSIS — R110 Nausea: Secondary | ICD-10-CM | POA: Diagnosis not present

## 2023-09-25 DIAGNOSIS — R0981 Nasal congestion: Secondary | ICD-10-CM | POA: Diagnosis not present

## 2023-09-27 ENCOUNTER — Other Ambulatory Visit: Payer: Self-pay | Admitting: Family Medicine

## 2023-09-27 DIAGNOSIS — R519 Headache, unspecified: Secondary | ICD-10-CM

## 2023-09-30 ENCOUNTER — Inpatient Hospital Stay
Admission: RE | Admit: 2023-09-30 | Discharge: 2023-09-30 | Discharge status: Home / Self Care | Source: Ambulatory Visit | Attending: Family Medicine

## 2023-09-30 DIAGNOSIS — R519 Headache, unspecified: Secondary | ICD-10-CM | POA: Diagnosis not present

## 2023-09-30 MED ORDER — GADOPICLENOL 0.5 MMOL/ML IV SOLN
6.0000 mL | Freq: Once | INTRAVENOUS | Status: AC | PRN
  Administered 2023-09-30: 6 mL via INTRAVENOUS

## 2023-10-04 ENCOUNTER — Telehealth: Payer: Self-pay | Admitting: Neurology

## 2023-10-04 NOTE — Telephone Encounter (Signed)
 Patient called to verify appointment and to check if appointment was 2 hours.

## 2023-10-05 ENCOUNTER — Ambulatory Visit (INDEPENDENT_AMBULATORY_CARE_PROVIDER_SITE_OTHER): Admitting: Neurology

## 2023-10-05 ENCOUNTER — Encounter: Payer: Self-pay | Admitting: Neurology

## 2023-10-05 ENCOUNTER — Ambulatory Visit: Admitting: Neurology

## 2023-10-05 VITALS — BP 149/85 | HR 101 | Ht 64.5 in | Wt 155.5 lb

## 2023-10-05 DIAGNOSIS — R0683 Snoring: Secondary | ICD-10-CM | POA: Diagnosis not present

## 2023-10-05 DIAGNOSIS — G479 Sleep disorder, unspecified: Secondary | ICD-10-CM

## 2023-10-05 DIAGNOSIS — R519 Headache, unspecified: Secondary | ICD-10-CM | POA: Diagnosis not present

## 2023-10-05 DIAGNOSIS — Z8616 Personal history of COVID-19: Secondary | ICD-10-CM

## 2023-10-05 DIAGNOSIS — D329 Benign neoplasm of meninges, unspecified: Secondary | ICD-10-CM

## 2023-10-05 DIAGNOSIS — Z8669 Personal history of other diseases of the nervous system and sense organs: Secondary | ICD-10-CM

## 2023-10-05 NOTE — Progress Notes (Signed)
 Subjective:    Patient ID: Mary Rubio is a 78 y.o. female.  HPI    True Mar, MD, PhD [facility] [address] [address] [place], [place] [address]  I saw patient, Mary Rubio, as a referral from the emergency room for evaluation of her recurrent headaches and recent onset of left-sided headaches.  The patient is accompanied by her daughter today.  [name] is a 78 year old female with an underlying medical history of allergies, asthma, anxiety, depression, recent history of COVID, eosinophilic esophagitis, reflux disease, hiatal hernia, hyperlipidemia, osteopenia, osteoporosis, chronic kidney disease, irritable bowel syndrome, vitamin D  deficiency, vitamin B12 deficiency, and overweight state, who reports a recurrent left-sided temporal and retro-orbital headache since her second cataract surgery in August.  She had her right eye cataract surgery in July 2025 and in August she had her left eye done, there was no obvious complications.  She reports that she is sensitive to light, denies any prior history of migraines.  She was suspected to have giant cell arteritis and was recommended by the on-call ophthalmologist to go to the emergency room.  She reports ongoing issues with nausea and reports a recent diagnosis of COVID.  She went to [facility] 3 times, was diagnosed with COVID, was treated with Paxlovid, then was treated for sinus infection, was prescribed 3 different antibiotics and is still completing doxycycline at this time.  She had taste changes, had nausea from food smells and reports worsening reflux symptoms including acid pain and acid regurgitation.  She has a GI specialist but has not seen them.  She reports not keeping anything down, taste has changed and appetite is reduced and she is not hydrating well.  She feels sick.  She feels ill.  She has not been hydrating well with water , estimates that she drinks 2 to 3 cups  of water  per day and drinks coffee in the morning, also caffeine containing soda, up to 312 ounce bottles per day.  She presented to the emergency room at [facility] on 08/29/2023 with a 2-week history of left-sided headaches.  She had recently undergone cataract surgery with Dr. McCuen.   I reviewed the emergency room records.    She had blood work which showed Normal inflammatory markers, ESR was 20, CRP was less than 0.5.  TSH was normal but free T4 was mildly elevated at 1.27.  Troponin was negative.  She had a CT angiogram of the head and neck with and without contrast on 08/29/2023 and I reviewed the results:  IMPRESSION: 1. Normal CTA of the head and neck. No large vessel occlusion or other emergent finding. No hemodynamically significant or correctable stenosis. 2. No other acute intracranial abnormality. 3.  Aortic Atherosclerosis (ICD10-I70.0).    She had a brain MRI with and without contrast on 09/30/2023 and I reviewed the results:  IMPRESSION: 1. No acute intracranial abnormality. 2. Focal expansion of the inner table of the left frontal calvarium with subtle enhancement, possibly representing an intraosseous meningioma. No definite restricted diffusion.   Of note, she has been taking high-dose ibuprofen, 600 mg strength every 6 hours.  She has not tried Tylenol .  She does not sleep well.  She has a prior diagnosis of obstructive sleep apnea and was treated with a CPAP machine over 30 years ago but stopped using a PAP machine about 20+ years ago.  She does not drink any alcohol.  She snores loudly per daughter.  Her  Epworth sleepiness score is 6 out of 24, fatigue severity score is 23 out of 63.  Her Past Medical History Is Significant For: Past Medical History:  Diagnosis Date   Allergy    Anxiety    Asthma    Cataract    Colon polyps    COVID-19 07/2020   Depression    Diverticulosis    Environmental allergies    Weekly allergy shots   Eosinophilic  esophagitis    Esophageal stricture    Esophagitis    Fall    GERD (gastroesophageal reflux disease)    Hiatal hernia    Hyperlipidemia    Hypothyroidism    IBS (irritable bowel syndrome)    Kidney disease    stage 3   Osteopenia    Osteoporosis    Skin cancer    Left arm Squamous cell 10/10/2018   Sleep apnea    Vitamin B 12 deficiency    Vitamin D  deficiency     Her Past Surgical History Is Significant For: Past Surgical History:  Procedure Laterality Date   ABDOMINAL HYSTERECTOMY     BARTHOLIN GLAND CYST EXCISION     BUNIONECTOMY Right    CHOLECYSTECTOMY     COLONOSCOPY  2016   ESOPHAGOGASTRODUODENOSCOPY N/A 10/21/2012   Procedure: ESOPHAGOGASTRODUODENOSCOPY (EGD);  Surgeon: Belvie JONETTA Just, MD;  Location: [facility];  Service: Endoscopy;  Laterality: N/A;   ESOPHAGOGASTRODUODENOSCOPY N/A 11/02/2017   Procedure: ESOPHAGOGASTRODUODENOSCOPY (EGD);  Surgeon: Albertus Gordy HERO, MD;  Location: [facility] ENDOSCOPY;  Service: Gastroenterology;  Laterality: N/A;   EXPLORATORY LAPAROTOMY     endometriosis   facial cyst     excision   NASAL SEPTUM SURGERY     SKIN CANCER EXCISION Left 10/10/2018   thumb surgery Left    for cyst    Her Family History Is Significant For: Family History  Problem Relation Age of Onset   Migraines Mother    Colon polyps Mother    Lung cancer Mother    Hypertension Mother    Thyroid  disease Mother    Heart disease Mother    Cancer - Lung Mother    Skin cancer Father    Dementia Father    Arrhythmia Father    Heart disease Father        Several relatives on dad's side have had MIs/CABG   Alzheimer's disease Father    Migraines Sister    Colon polyps Sister    Heart disease Sister    Multiple myeloma Sister    Breast cancer Maternal Aunt    Heart disease Maternal Grandfather    Heart disease Paternal Grandmother    Thyroid  disease Daughter    Colon cancer Neg Hx    Esophageal cancer Neg Hx    Stomach cancer Neg Hx    Rectal cancer Neg Hx     Pancreatic cancer Neg Hx     Her Social History Is Significant For: Social History   Socioeconomic History   Marital status: Single    Spouse name: Not on file   Number of children: 1   Years of education: college   Highest education level: Not on file  Occupational History   Occupation: caregiver    Employer: NOT EMPLOYED  Tobacco Use   Smoking status: Never   Smokeless tobacco: Never  Vaping Use   Vaping status: Never Used  Substance and Sexual Activity   Alcohol use: Yes    Comment: occ beer   Drug use: No   Sexual activity:  Never  Other Topics Concern   Not on file  Social History Narrative   Caffiene 12 oz coffee, soda 2-3 cans daily   Working: retired, 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   Lives alone,  pet 3 cats     Social Drivers of Health   Financial Resource Strain: Unknown (10/01/2020)   Received from [facility]   Overall Financial Resource Strain (CARDIA)    Difficulty of Paying Living Expenses: Patient declined  Food Insecurity: Unknown (10/01/2020)   Received from [facility]   Hunger Vital Sign    Within the past 12 months, you worried that your food would run out before you got the money to buy more.: Patient declined    Within the past 12 months, the food you bought just didn't last and you didn't have money to get more.: Patient declined  Transportation Needs: No Transportation Needs (10/01/2020)   Received from [facility] - Transportation    Lack of Transportation (Medical): No    Lack of Transportation (Non-Medical): No  Physical Activity: Inactive (10/01/2020)   Received from [facility]   Exercise Vital Sign    On average, how many days per week do you engage in moderate to strenuous exercise (like a brisk walk)?: 0 days    On average, how many minutes do you engage in exercise at this level?: 0 min  Stress: No Stress Concern Present (10/01/2020)   Received from [facility] - Occupational  Stress Questionnaire    Feeling of Stress : Only a little  Social Connections: Unknown (06/02/2021)   Received from [facility]   Social Network    Social Network: Not on file    Her Allergies Are:  Allergies  Allergen Reactions   Cymbalta [Duloxetine Hcl] Diarrhea and Nausea And Vomiting   Augmentin [Amoxicillin -Pot Clavulanate] Other (See Comments)    Stomach pain    Latex Other (See Comments)    Canker sores in mouth (discovered by dentist)   Rosuvastatin Other (See Comments)    Myalgias   Codeine Rash    Has tolerated small doses of hydrocodone   Demerol [Meperidine] Rash    Rash   Meperidine Hcl Other (See Comments)    Does not remember    Morphine And Codeine Itching    Has tolerated small doses of hydrocodone   Pentazocine Lactate Nausea And Vomiting    Sick on stomach   Simvastatin Other (See Comments)    Muscle aches    Talwin [Pentazocine] Other (See Comments)    Unknown   :   Her Current Medications Are:  Outpatient Encounter Medications as of 10/05/2023  Medication Sig   albuterol  (VENTOLIN  HFA) 108 (90 Base) MCG/ACT inhaler Inhale 2 puffs into the lungs every 6 (six) hours as needed for wheezing or shortness of breath.   ALPRAZolam  (XANAX ) 0.5 MG tablet Take 0.5 mg by mouth daily as needed.   azelastine (ASTELIN) 0.1 % nasal spray Place into both nostrils 2 (two) times daily. Use in each nostril as directed (Patient taking differently: Place into both nostrils 2 (two) times daily as needed. Use in each nostril as directed)   azelastine (OPTIVAR) 0.05 % ophthalmic solution Place 1 drop into both eyes daily.  (Patient taking differently: Place 1 drop into both eyes daily as needed.)   cholecalciferol (VITAMIN D ) 1000 units tablet Take 2,000 Units by mouth daily.   Cyanocobalamin  (VITAMIN B-12) 1000 MCG SUBL  doxycycline (VIBRAMYCIN) 100 MG capsule 100 mg 2 (two) times daily.   esomeprazole  (NEXIUM ) 40 MG capsule TAKE 1 CAPSULE BY MOUTH TWICE DAILY BEFORE A  MEAL   ibuprofen (ADVIL) 600 MG tablet Take 600 mg by mouth every 6 (six) hours as needed.   levothyroxine  (SYNTHROID ) 75 MCG tablet Take 75 mcg by mouth daily before breakfast.   ondansetron  (ZOFRAN -ODT) 4 MG disintegrating tablet Take 4 mg by mouth every 8 (eight) hours as needed.   [DISCONTINUED] prochlorperazine  (COMPAZINE ) 10 MG tablet Take 1 tablet (10 mg total) by mouth every 8 (eight) hours as needed for nausea or vomiting.   [DISCONTINUED] traMADol (ULTRAM) 50 MG tablet Take 50 mg by mouth every 6 (six) hours as needed.   No facility-administered encounter medications on file as of 10/05/2023.  :   Review of Systems:  Out of a complete 14 point review of systems, all are reviewed and negative with the exception of these symptoms as listed below:   Review of Systems  Neurological:        Headaches L temporal. 08-27-2023.  Post cataract surgery. Has had MRI (meningioma) [facility].  ESS 6, FSS 23.    Objective:  Neurological Exam  Physical Exam Physical Examination:   Vitals:   10/05/23 1353 10/05/23 1416  BP: (!) 152/84 (!) 149/85  Pulse: (!) 105 (!) 101    General Examination: The patient is a very pleasant 78 y.o. female in no acute distress. She appears in mild distress, intermittently tearful.    HEENT: Normocephalic, atraumatic, no temporal tenderness, prominent veins in the temple areas bilaterally.  Mild photophobia.  Pupils are equal, round and reactive to light, extraocular tracking is good without limitation to gaze excursion or nystagmus noted. Hearing is grossly intact. Face is symmetric with normal facial animation. Speech is clear with no dysarthria noted. There is no hypophonia. There is no lip, neck/head, jaw or voice tremor. Neck is supple with full range of passive and active motion. There are no carotid bruits on auscultation. Oropharynx exam reveals: Moderate mouth dryness, adequate dental hygiene, moderate airway crowding secondary to small airway.   Tongue protrudes centrally and palate elevates symmetrically.    Chest: Clear to auscultation without wheezing, rhonchi or crackles noted.  Heart: S1+S2+0, regular and normal without murmurs, rubs or gallops noted.   Abdomen: Soft, non-tender and non-distended.  Extremities: There is no pitting edema in the distal lower extremities bilaterally.   Skin: Warm and dry without trophic changes noted.   Musculoskeletal: exam reveals no obvious joint deformities.   Neurologically:  Mental status: The patient is awake, alert and oriented in all 4 spheres. Her immediate and remote memory, attention, language skills and fund of knowledge are appropriate. There is no evidence of aphasia, agnosia, apraxia or anomia. Speech is clear with normal prosody and enunciation. Thought process is linear. Mood is normal and affect is normal.  Cranial nerves II - XII are as described above under HEENT exam.  Motor exam: Normal bulk, strength and tone is noted. There is no obvious action or resting tremor.  No drift or rebound. Reflexes 1+ throughout, toes are downgoing bilaterally. Fine motor skills and coordination: grossly intact.  Normal finger taps. Cerebellar testing: No dysmetria or intention tremor. There is no truncal or gait ataxia.  Sensory exam: intact to light touch in the upper and lower extremities.  Gait, station and balance: She stands easily. No veering to one side is noted. No leaning to one side is noted. Posture  is age-appropriate and stance is narrow based. Gait shows normal stride length and normal pace. No problems turning are noted.   Assessment and plan:  In summary, ARTHELLA HEADINGS is a very pleasant 78 y.o.-year old female with an underlying medical history of allergies, asthma, anxiety, depression, recent history of COVID, eosinophilic esophagitis, reflux disease, hiatal hernia, hyperlipidemia, osteopenia, osteoporosis, chronic kidney disease, irritable bowel syndrome, vitamin D   deficiency, vitamin B12 deficiency, and overweight state, who presents for evaluation of her new onset headache of approximately 5 to 6 weeks duration, started fairly abruptly in the context of COVID infection which then evolved into a sinus infection.  History and examination, as well as laboratory testing recently does not suggest temporal arteritis.  She has a prior diagnosis of obstructive sleep apnea and may still be at risk.  We talked about headache triggers and alleviating factors.  She is advised that her headaches are likely still a remnant from her recent COVID infection.  She is discouraged from taking high-dose ibuprofen or anti-inflammatory medication because of her history of esophagitis and symptoms of reflux disease which have exacerbated.  She is strongly advised to try to hydrate better and avoid caffeine which can also trigger tachycardia and worsening headaches.  Furthermore, caffeine may exacerbate her sleep disturbance.  She has had a CT angiogram of the head and neck as well as a recent brain MRI which showed an incidental finding of a meningioma. This was an extended visit of over 60 minutes with high complexity given copious record review involved, considerable counseling and coordination of care, addressing multiple problems.  Below is a summary of my recommendations and our discussion points from today's visit, based on chart review, history and examination. They were given these instructions verbally during the visit in detail and also in writing in the MyChart after visit summary (AVS), which they can access electronically. << Please remember, common headache triggers are: sleep deprivation, dehydration, overheating, stress, hypoglycemia or skipping meals and blood sugar fluctuations, excessive pain medications or excessive alcohol use or caffeine withdrawal. Some people have food triggers such as aged cheese, orange juice or chocolate, especially dark chocolate, or MSG (monosodium  glutamate). Try to avoid these headache triggers as much possible. It may be helpful to keep a headache diary to figure out what makes your headaches worse or brings them on and what alleviates them. Some people report headache onset after exercise but studies have shown that regular exercise may actually prevent headaches from coming. If you have exercise-induced headaches, please make sure that you drink plenty of fluid before and after exercising and that you do not over do it and do not overheat. Please reduce and eliminate daily caffeine consumption and avoid taking nonsteroidal anti-inflammatory medications such as ibuprofen, Advil or Motrin as this can exacerbate your reflux and your esophagitis. You can try Tylenol  as needed.  I will make a referral to a neurosurgeon due to your recent finding of a meningioma on your brain MRI.  Please hydrate better with water  as you may be at risk for dehydration.  If you have worsening pain despite these measures, go back to the emergency room for more urgent evaluation and treatment.  Your headaches are unlikely to be migraines and findings are not in keeping with temporal arteritis.  I will order a sleep study to look for signs of obstructive sleep apnea (aka OSA). As explained, the long-term risks and ramifications of untreated moderate to severe obstructive sleep apnea may include (but  are not limited to): increased risk for cardiovascular disease, including congestive heart failure, stroke, difficult to control hypertension, treatment resistant obesity, arrhythmias, especially irregular heartbeat commonly known as A. Fib. (atrial fibrillation); even type 2 diabetes has been linked to untreated OSA.  Follow-up with your eye doctor as scheduled.>>   True Mar, MD, PhD

## 2023-10-05 NOTE — Patient Instructions (Addendum)
 It was nice to meet you today.   As discussed, your headaches are likely due to a combination of factors.   Here is what we discussed today and my recommendations for you:   Please remember, common headache triggers are: sleep deprivation, dehydration, overheating, stress, hypoglycemia or skipping meals and blood sugar fluctuations, excessive pain medications or excessive alcohol use or caffeine withdrawal. Some people have food triggers such as aged cheese, orange juice or chocolate, especially dark chocolate, or MSG (monosodium glutamate). Try to avoid these headache triggers as much possible. It may be helpful to keep a headache diary to figure out what makes your headaches worse or brings them on and what alleviates them. Some people report headache onset after exercise but studies have shown that regular exercise may actually prevent headaches from coming. If you have exercise-induced headaches, please make sure that you drink plenty of fluid before and after exercising and that you do not over do it and do not overheat. Please reduce and eliminate daily caffeine consumption and avoid taking nonsteroidal anti-inflammatory medications such as ibuprofen, Advil or Motrin as this can exacerbate your reflux and your esophagitis. You can try Tylenol  as needed.  I will make a referral to a neurosurgeon due to your recent finding of a meningioma on your brain MRI.  Please hydrate better with water  as you may be at risk for dehydration.  If you have worsening pain despite these measures, go back to the emergency room for more urgent evaluation and treatment.  Your headaches are unlikely to be migraines and findings are not in keeping with temporal arteritis.  I will order a sleep study to look for signs of obstructive sleep apnea (aka OSA). As explained, the long-term risks and ramifications of untreated moderate to severe obstructive sleep apnea may include (but are not limited to): increased risk for  cardiovascular disease, including congestive heart failure, stroke, difficult to control hypertension, treatment resistant obesity, arrhythmias, especially irregular heartbeat commonly known as A. Fib. (atrial fibrillation); even type 2 diabetes has been linked to untreated OSA.  Follow-up with your eye doctor as scheduled.

## 2023-10-06 ENCOUNTER — Telehealth: Payer: Self-pay | Admitting: Neurology

## 2023-10-06 ENCOUNTER — Other Ambulatory Visit: Payer: Self-pay

## 2023-10-06 ENCOUNTER — Emergency Department (HOSPITAL_BASED_OUTPATIENT_CLINIC_OR_DEPARTMENT_OTHER)
Admission: EM | Admit: 2023-10-06 | Discharge: 2023-10-07 | Disposition: A | Discharge status: Home / Self Care | Attending: Emergency Medicine | Admitting: Emergency Medicine

## 2023-10-06 DIAGNOSIS — N183 Chronic kidney disease, stage 3 unspecified: Secondary | ICD-10-CM | POA: Insufficient documentation

## 2023-10-06 DIAGNOSIS — Z8616 Personal history of COVID-19: Secondary | ICD-10-CM | POA: Insufficient documentation

## 2023-10-06 DIAGNOSIS — Z79890 Hormone replacement therapy (postmenopausal): Secondary | ICD-10-CM | POA: Insufficient documentation

## 2023-10-06 DIAGNOSIS — R519 Headache, unspecified: Secondary | ICD-10-CM | POA: Insufficient documentation

## 2023-10-06 DIAGNOSIS — M62830 Muscle spasm of back: Secondary | ICD-10-CM | POA: Diagnosis not present

## 2023-10-06 DIAGNOSIS — E039 Hypothyroidism, unspecified: Secondary | ICD-10-CM | POA: Diagnosis not present

## 2023-10-06 DIAGNOSIS — Z91040 Latex allergy status: Secondary | ICD-10-CM | POA: Insufficient documentation

## 2023-10-06 DIAGNOSIS — Z85828 Personal history of other malignant neoplasm of skin: Secondary | ICD-10-CM | POA: Diagnosis not present

## 2023-10-06 DIAGNOSIS — J45909 Unspecified asthma, uncomplicated: Secondary | ICD-10-CM | POA: Insufficient documentation

## 2023-10-06 LAB — CBC
HCT: 38 % (ref 36.0–46.0)
Hemoglobin: 13.3 g/dL (ref 12.0–15.0)
MCH: 31.6 pg (ref 26.0–34.0)
MCHC: 35 g/dL (ref 30.0–36.0)
MCV: 90.3 fL (ref 80.0–100.0)
Platelets: 204 K/uL (ref 150–400)
RBC: 4.21 MIL/uL (ref 3.87–5.11)
RDW: 12.1 % (ref 11.5–15.5)
WBC: 7.2 K/uL (ref 4.0–10.5)
nRBC: 0 % (ref 0.0–0.2)

## 2023-10-06 LAB — URINALYSIS, ROUTINE W REFLEX MICROSCOPIC
Bilirubin Urine: NEGATIVE
Glucose, UA: NEGATIVE mg/dL
Hgb urine dipstick: NEGATIVE
Ketones, ur: NEGATIVE mg/dL
Leukocytes,Ua: NEGATIVE
Nitrite: NEGATIVE
Protein, ur: NEGATIVE mg/dL
Specific Gravity, Urine: <1.005 — ABNORMAL LOW (ref 1.005–1.030)
pH: 6.5 (ref 5.0–8.0)

## 2023-10-06 LAB — COMPREHENSIVE METABOLIC PANEL WITH GFR
ALT: 10 U/L (ref 0–44)
AST: 20 U/L (ref 15–41)
Albumin: 4.3 g/dL (ref 3.5–5.0)
Alkaline Phosphatase: 68 U/L (ref 38–126)
Anion gap: 14 (ref 5–15)
BUN: 13 mg/dL (ref 8–23)
CO2: 21 mmol/L — ABNORMAL LOW (ref 22–32)
Calcium: 9.5 mg/dL (ref 8.9–10.3)
Chloride: 104 mmol/L (ref 98–111)
Creatinine, Ser: 1.04 mg/dL — ABNORMAL HIGH (ref 0.44–1.00)
GFR, Estimated: 56 mL/min — ABNORMAL LOW (ref >60)
Glucose, Bld: 102 mg/dL — ABNORMAL HIGH (ref 70–99)
Potassium: 3.7 mmol/L (ref 3.5–5.1)
Sodium: 139 mmol/L (ref 135–145)
Total Bilirubin: 0.4 mg/dL (ref 0.0–1.2)
Total Protein: 6.8 g/dL (ref 6.5–8.1)

## 2023-10-06 LAB — LIPASE, BLOOD: Lipase: 24 U/L (ref 11–51)

## 2023-10-06 MED ORDER — SODIUM CHLORIDE 0.9 % IV BOLUS
500.0000 mL | Freq: Once | INTRAVENOUS | Status: AC
  Administered 2023-10-07: 500 mL via INTRAVENOUS

## 2023-10-06 MED ORDER — METHOCARBAMOL 1000 MG/10ML IJ SOLN
1000.0000 mg | Freq: Once | INTRAMUSCULAR | Status: AC
  Administered 2023-10-07: 1000 mg via INTRAVENOUS
  Filled 2023-10-06: qty 10

## 2023-10-06 MED ORDER — KETOROLAC TROMETHAMINE 15 MG/ML IJ SOLN
15.0000 mg | Freq: Once | INTRAMUSCULAR | Status: AC
  Administered 2023-10-07: 15 mg via INTRAVENOUS
  Filled 2023-10-06: qty 1

## 2023-10-06 NOTE — Telephone Encounter (Signed)
 Referral for neurosurgery sent through [facility] to [facility]. Phone: [phone], Fax: [phone]

## 2023-10-06 NOTE — ED Provider Notes (Signed)
 [place] [facility] DEPARTMENT AT [facility] Provider Note  CSN: [id number] Arrival date & time: 10/06/23 1802  Chief Complaint(s) Emesis and Headache  HPI Mary Rubio is a 78 y.o. female with a past medical history listed below who presents to the emergency department with persistent left-sided headache that began approximately 40 days ago and has been constant since.  She reports feeling a stabbing pain behind her left eye while throwing out the trash with her left arm.  Prior to this she had just gotten cataract surgery and the initial workup in the emergency department was focused on ruling out complication and ruling out temporal arteritis.  She also obtained a CTA of the head and neck that was negative.  On follow-up appointment with her primary care provider, she was noted to be positive for COVID.  Several days later she developed sinus congestion and cough.  She was treated with antibiotics.  Her pain persistent and she followed up with primary care provider who did an MRI which showed meningioma. she was subsequently referred to neurology who felt this headache was related to COVID headache.  The history is provided by the patient.    Past Medical History Past Medical History:  Diagnosis Date   Allergy    Anxiety    Asthma    Cataract    Colon polyps    COVID-19 07/2020   Depression    Diverticulosis    Environmental allergies    Weekly allergy shots   Eosinophilic esophagitis    Esophageal stricture    Esophagitis    Fall    GERD (gastroesophageal reflux disease)    Hiatal hernia    Hyperlipidemia    Hypothyroidism    IBS (irritable bowel syndrome)    Kidney disease    stage 3   Osteopenia    Osteoporosis    Skin cancer    Left arm Squamous cell 10/10/2018   Sleep apnea    Vitamin B 12 deficiency    Vitamin D  deficiency    Patient Active Problem List   Diagnosis Date Noted   Suspected COVID-19 virus infection 10/24/2018   Esophageal  obstruction due to food impaction    Esophagitis    Vitamin D  deficiency 01/24/2015   Osteopenia 07/24/2014   Depression 06/11/2014   Vaginal lesion 04/12/2013   Dysphagia 05/26/2012   Esophageal reflux 05/26/2012   Diaphragmatic hernia 05/26/2012   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 Authorizing Provider  methocarbamol  (ROBAXIN ) 500 MG tablet Take 1-2 tablets (500-1,000 mg total) by mouth every 8 (eight) hours as needed for muscle spasms. 10/07/23  Yes Mariabella Nilsen, Raynell Moder, MD  ondansetron  (ZOFRAN -ODT) 4 MG disintegrating tablet Take 1 tablet (4 mg total) by mouth every 8 (eight) hours as needed for up to 3 days for nausea or vomiting. 10/07/23 10/10/23 Yes Nathanyel Defenbaugh, Raynell Moder, MD  albuterol  (VENTOLIN  HFA) 108 [phone] Base) MCG/ACT inhaler Inhale 2 puffs into the lungs every 6 (six) hours as needed for wheezing or shortness of breath. 07/29/20   Sofia, Leslie K, PA-C  ALPRAZolam  (XANAX ) 0.5 MG tablet Take 0.5 mg by mouth daily as needed. 09/15/23   [provider]  azelastine (ASTELIN) 0.1 % nasal spray Place into both nostrils 2 (two) times daily. Use in each nostril as  directed Patient taking differently: Place into both nostrils 2 (two) times daily as needed. Use in each nostril as directed    [provider]  azelastine (OPTIVAR) 0.05 % ophthalmic solution Place 1 drop into both eyes daily.  Patient taking differently: Place 1 drop into both eyes daily as needed.    [provider]  cholecalciferol (VITAMIN D ) 1000 units tablet Take 2,000 Units by mouth daily.    [provider]  Cyanocobalamin  (VITAMIN B-12) 1000 MCG SUBL  10/19/20   [provider]  doxycycline (VIBRAMYCIN) 100 MG capsule 100 mg 2 (two) times  daily. 09/25/23   [provider]  esomeprazole  (NEXIUM ) 40 MG capsule TAKE 1 CAPSULE BY MOUTH TWICE DAILY BEFORE A MEAL 01/22/23   Pyrtle, Gordy HERO, MD  ibuprofen (ADVIL) 600 MG tablet Take 600 mg by mouth every 6 (six) hours as needed.    [provider]  levothyroxine  (SYNTHROID ) 75 MCG tablet Take 75 mcg by mouth daily before breakfast.    [provider]                                                                                                                                    Allergies Cymbalta [duloxetine hcl], Augmentin [amoxicillin -pot clavulanate], Latex, Rosuvastatin, Codeine, Demerol [meperidine], Meperidine hcl, Morphine and codeine, Pentazocine lactate, Simvastatin, and Talwin [pentazocine]  Review of Systems Review of Systems As noted in HPI  Physical Exam Vital Signs  I have reviewed the triage vital signs BP (!) 119/59   Pulse 74   Temp 98.1 F (36.7 C)   Resp 16   Ht 5' 2.5 (1.588 m)   Wt 61.2 kg   SpO2 98%   BMI 24.30 kg/m   Physical Exam Vitals reviewed.  Constitutional:      General: She is not in acute distress.    Appearance: She is well-developed. She is not diaphoretic.  HENT:     Head: Normocephalic and atraumatic.      Right Ear: External ear normal.     Left Ear: External ear normal.     Nose: Nose normal.  Eyes:     General: No scleral icterus.    Conjunctiva/sclera: Conjunctivae normal.  Neck:     Trachea: Phonation normal.  Cardiovascular:     Rate and Rhythm: Normal rate and regular rhythm.  Pulmonary:     Effort: Pulmonary effort is normal. No respiratory distress.     Breath sounds: No stridor.  Abdominal:     General: There is no distension.  Musculoskeletal:        General: Normal range of motion.     Cervical back: Normal range of motion. Spasms, torticollis and tenderness present. Muscular tenderness present.     Thoracic back: Spasms and tenderness present.       Back:     Comments: Palpating the  spastic musculature initially reproduce and worsen the patient's headache.  With persistent acupressure application, muscle spasm released and patient's headache improved.  Neurological:     Mental Status: She is alert and oriented to person, place, and time.  Psychiatric:        Behavior: Behavior normal.     ED Results and Treatments Labs (all labs ordered are listed, but only abnormal results are displayed) Labs Reviewed  COMPREHENSIVE METABOLIC PANEL WITH GFR - Abnormal; Notable for the following components:      Result Value   CO2 21 (*)    Glucose, Bld 102 (*)    Creatinine, Ser 1.04 (*)    GFR, Estimated 56 (*)    All other components within normal limits  URINALYSIS, ROUTINE W REFLEX MICROSCOPIC - Abnormal; Notable for the following components:   Color, Urine COLORLESS (*)    Specific Gravity, Urine <1.005 (*)    All other components within normal limits  LIPASE, BLOOD  CBC                                                                                                                         EKG  EKG Interpretation Date/Time:    Ventricular Rate:    PR Interval:    QRS Duration:    QT Interval:    QTC Calculation:   R Axis:      Text Interpretation:         Radiology No results found.  Medications Ordered in ED Medications  ketorolac  (TORADOL ) 15 MG/ML injection 15 mg (15 mg Intravenous Given 10/07/23 0014)  methocarbamol  (ROBAXIN ) injection 1,000 mg (1,000 mg Intravenous Given 10/07/23 0014)  sodium chloride  0.9 % bolus 500 mL (0 mLs Intravenous Stopped 10/07/23 0109)   Procedures Procedures  (including critical care time) Medical Decision Making / ED Course   Medical Decision Making Amount and/or Complexity of Data Reviewed Labs: ordered. Decision-making details documented in ED Course.  Risk Prescription drug management.    Headache differential diagnosis considered Patient has had extensive workup ruling out acute arterial process, temporal  arteritis, ocular etiology. On my exam, I am favoring muscle strain/spasm of the left shoulder girdle and neck musculature causing occipital and trigeminal neuralgia.  The patient had improvement with acupressure.  Provided with additional IV pain medicine.  Labs are reassuring.  On reassessment, patient reported near complete resolution of her headache    Final Clinical Impression(s) / ED Diagnoses Final diagnoses:  Bad headache  Muscle spasm of back   The patient appears reasonably screened and/or stabilized for discharge and I doubt any other medical condition or other [facility] requiring further screening, evaluation, or treatment in the ED at this time. I have discussed the findings, Dx and Tx plan with the patient/family who expressed understanding and agree(s) with the plan. Discharge instructions discussed at length. The patient/family was given strict return precautions who verbalized understanding of the instructions. No further questions at time of discharge.  Disposition: Discharge  Condition: Good  ED Discharge Orders  Ordered    methocarbamol  (ROBAXIN ) 500 MG tablet  Every 8 hours PRN        10/07/23 0048    ondansetron  (ZOFRAN -ODT) 4 MG disintegrating tablet  Every 8 hours PRN        10/07/23 0048              Follow Up: Claudene Lacks, MD [address] [place] [address] [phone]  Call      This chart was dictated using voice recognition software.  Despite best efforts to proofread,  errors can occur which can change the documentation meaning.    Trine Raynell Moder, MD 10/07/23 [phone]

## 2023-10-06 NOTE — ED Triage Notes (Signed)
 Pt POV reporting decreased PO intake, emesis, and headache since covid dx in August. Seen by neurologist yesterday, advised to come to ED for possible dehydration. Pt also reports hx EOE.

## 2023-10-07 ENCOUNTER — Encounter: Payer: Self-pay | Admitting: Internal Medicine

## 2023-10-07 DIAGNOSIS — R519 Headache, unspecified: Secondary | ICD-10-CM | POA: Diagnosis not present

## 2023-10-07 MED ORDER — ONDANSETRON 4 MG PO TBDP: 4.0000 mg | ORAL_TABLET | Freq: Three times a day (TID) | ORAL | 0 refills | Status: AC | PRN

## 2023-10-07 MED ORDER — METHOCARBAMOL 500 MG PO TABS: 500.0000 mg | ORAL_TABLET | Freq: Three times a day (TID) | ORAL | 0 refills | Status: DC | PRN

## 2023-10-07 NOTE — Discharge Instructions (Signed)
 You may take Acetaminophen  (Tylenol ), topical muscle creams such as SalonPas, [employer], Bengay, etc. Please stretch, apply ice or heat (whichever helps), red light therapy, and have massage therapy for additional assistance.  For pain control you may take 1000 mg of acetaminophen  (Tylenol ) every 8 hours as needed  Please limit 24 hr usage of the following medicines: - Acetaminophen  (Tylenol ) to 4000 mg    Please note that other over-the-counter medicine may contain acetaminophen  as a component of their ingredients.

## 2023-10-12 ENCOUNTER — Encounter: Payer: Self-pay | Admitting: Internal Medicine

## 2023-10-13 ENCOUNTER — Telehealth: Payer: Self-pay | Admitting: Neurology

## 2023-10-13 NOTE — Telephone Encounter (Signed)
 LVM for pt to call back to schedule    Medicare/bcbs supp no auth req

## 2023-10-14 NOTE — Telephone Encounter (Signed)
 Just an fyi.  Patient called me back and left a voicemail stating that she has found out that she has a tumor in her skull and will be meeting with the neuro surgeons. At this time she is not going to schedule a SS.

## 2023-10-14 NOTE — Telephone Encounter (Signed)
 Noted, thank you!

## 2023-10-14 NOTE — Telephone Encounter (Signed)
 noted

## 2023-10-21 DIAGNOSIS — D329 Benign neoplasm of meninges, unspecified: Secondary | ICD-10-CM | POA: Diagnosis not present

## 2023-10-21 DIAGNOSIS — R519 Headache, unspecified: Secondary | ICD-10-CM | POA: Diagnosis not present

## 2023-10-21 DIAGNOSIS — N1831 Chronic kidney disease, stage 3a: Secondary | ICD-10-CM | POA: Diagnosis not present

## 2023-10-26 ENCOUNTER — Ambulatory Visit: Admitting: Neurosurgery

## 2023-10-26 VITALS — BP 130/82 | HR 78 | Temp 98.1°F | Ht 62.5 in | Wt 125.4 lb

## 2023-10-26 DIAGNOSIS — D329 Benign neoplasm of meninges, unspecified: Secondary | ICD-10-CM | POA: Diagnosis not present

## 2023-10-26 MED ORDER — METHYLPREDNISOLONE 4 MG PO TBPK: ORAL_TABLET | ORAL | 0 refills | Status: AC

## 2023-10-26 NOTE — Progress Notes (Unsigned)
 Assessment : 78 year old lady with a past medical history significant for GERD, and surgeries for cholecystitis among others.  A few weeks ago she was taking the trash out and threw the trash into the bank and suddenly experienced a severe pain around her left orbit.  This has persisted and over time has gotten easier.  She went to the emergency room for evaluation and she was tested +2 days later for COVID.  Ever since then she has had this headache.  The pain is around her left orbit and when she pushes on her eyeball and touches her left temple, this makes the pain worse.  She has some sensitivity around her left cheek as well and this reminds her of her times that she has had sinusitis.  Patient saw Dr.Athar, neurologist, who recommended a sleep study and felt that this may be post COVID related headaches.  In 2004 patient had a head injury and had a CT scan and in 2024 had a CT scan as well.  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  Plan : On exam today, the periorbital area is definitely sensitive.  She had lab work done and the sedimentation rate was normal as well as the CRP.  Temporal arteritis was therefore ruled out.  I shared with her that the CT scan from 2004 demonstrates this intraosseous meningioma and in 2024 this was slightly larger but not significant enough for him need to recommend surgery.  She is 75 years young and I think that she has had this lesion for a very long period of time and I do not feel that this is related to her headaches.  I gave her the option of getting a repeat MRI in 1 year but she decided to hold off on that.  I will give her a Medrol  Dosepak to see if this helps with the headaches at all and I recommended follow-up with the neurology team for this.  If there is ever anything I can do for her in the future, she is welcome to call me.   Social History   Socioeconomic History    Marital status: Single    Spouse name: Not on file   Number of children: 1   Years of education: college   Highest education level: Not on file  Occupational History   Occupation: caregiver    Employer: NOT EMPLOYED  Tobacco Use   Smoking status: Never   Smokeless tobacco: Never  Vaping Use   Vaping status: Never Used  Substance and Sexual Activity   Alcohol use: Yes    Comment: occ beer   Drug use: No   Sexual activity: Never  Other Topics Concern   Not on file  Social History Narrative   Caffiene 12 oz coffee, soda 2-3 cans daily   Working: retired, 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   Lives alone,  pet 3 cats     Social Drivers of Health   Financial Resource Strain: Unknown (10/01/2020)   Received from [facility]   Overall Financial Resource Strain (CARDIA)    Difficulty of Paying Living Expenses: Patient declined  Food Insecurity: Unknown (10/01/2020)   Received from [facility]   Hunger Vital Sign    Within the past 12 months, you worried that your food would run out before you got the money to buy more.: Patient declined    Within the past 12 months, the food you bought just  didn't last and you didn't have money to get more.: Patient declined  Transportation Needs: No Transportation Needs (10/01/2020)   Received from [facility]   PRAPARE - Transportation    Lack of Transportation (Medical): No    Lack of Transportation (Non-Medical): No  Physical Activity: Inactive (10/01/2020)   Received from [facility]   Exercise Vital Sign    On average, how many days per week do you engage in moderate to strenuous exercise (like a brisk walk)?: 0 days    On average, how many minutes do you engage in exercise at this level?: 0 min  Stress: No Stress Concern Present (10/01/2020)   Received from [facility] - Occupational Stress Questionnaire    Feeling of Stress : Only a little  Social Connections: Unknown (06/02/2021)   Received from  [facility]   Social Network    Social Network: Not on file  Intimate Partner Violence: Unknown (04/24/2021)   Received from [facility]   HITS    Physically Hurt: Not on file    Insult or Talk Down To: Not on file    Threaten Physical Harm: Not on file    Scream or Curse: Not on file    Family History  Problem Relation Age of Onset   Migraines Mother    Colon polyps Mother    Lung cancer Mother    Hypertension Mother    Thyroid  disease Mother    Heart disease Mother    Cancer - Lung Mother    Skin cancer Father    Dementia Father    Arrhythmia Father    Heart disease Father        Several relatives on dad's side have had MIs/CABG   Alzheimer's disease Father    Migraines Sister    Colon polyps Sister    Heart disease Sister    Multiple myeloma Sister    Breast cancer Maternal Aunt    Heart disease Maternal Grandfather    Heart disease Paternal Grandmother    Thyroid  disease Daughter    Colon cancer Neg Hx    Esophageal cancer Neg Hx    Stomach cancer Neg Hx    Rectal cancer Neg Hx    Pancreatic cancer Neg Hx     Allergies  Allergen Reactions   Cymbalta [Duloxetine Hcl] Diarrhea and Nausea And Vomiting   Augmentin [Amoxicillin -Pot Clavulanate] Other (See Comments)    Stomach pain    Latex Other (See Comments)    Canker sores in mouth (discovered by dentist)   Rosuvastatin Other (See Comments)    Myalgias   Codeine Rash    Has tolerated small doses of hydrocodone   Demerol [Meperidine] Rash    Rash   Meperidine Hcl Other (See Comments)    Does not remember    Morphine And Codeine Itching    Has tolerated small doses of hydrocodone   Pentazocine Lactate Nausea And Vomiting    Sick on stomach   Simvastatin Other (See Comments)    Muscle aches    Talwin [Pentazocine] Other (See Comments)    Unknown     Past Medical History:  Diagnosis Date   Allergy    Anxiety    Asthma    Cataract    Colon polyps    COVID-19 07/2020   Depression     Diverticulosis    Environmental allergies    Weekly allergy shots   Eosinophilic esophagitis    Esophageal stricture  Esophagitis    Fall    GERD (gastroesophageal reflux disease)    Hiatal hernia    Hyperlipidemia    Hypothyroidism    IBS (irritable bowel syndrome)    Kidney disease    stage 3   Osteopenia    Osteoporosis    Skin cancer    Left arm Squamous cell 10/10/2018   Sleep apnea    Vitamin B 12 deficiency    Vitamin D  deficiency     Past Surgical History:  Procedure Laterality Date   ABDOMINAL HYSTERECTOMY     BARTHOLIN GLAND CYST EXCISION     BUNIONECTOMY Right    CHOLECYSTECTOMY     COLONOSCOPY  2016   ESOPHAGOGASTRODUODENOSCOPY N/A 10/21/2012   Procedure: ESOPHAGOGASTRODUODENOSCOPY (EGD);  Surgeon: Belvie JONETTA Just, MD;  Location: [facility];  Service: Endoscopy;  Laterality: N/A;   ESOPHAGOGASTRODUODENOSCOPY N/A 11/02/2017   Procedure: ESOPHAGOGASTRODUODENOSCOPY (EGD);  Surgeon: Albertus Gordy HERO, MD;  Location: [facility] ENDOSCOPY;  Service: Gastroenterology;  Laterality: N/A;   EXPLORATORY LAPAROTOMY     endometriosis   facial cyst     excision   NASAL SEPTUM SURGERY     SKIN CANCER EXCISION Left 10/10/2018   thumb surgery Left    for cyst     Physical Exam HENT:     Head: Normocephalic.     Nose: Nose normal.  Eyes:     Pupils: Pupils are equal, round, and reactive to light.  Cardiovascular:     Rate and Rhythm: Normal rate.  Pulmonary:     Effort: Pulmonary effort is normal.  Abdominal:     General: Abdomen is flat.  Musculoskeletal:     Cervical back: Normal range of motion.  Neurological:     Mental Status: She is alert.     Cranial Nerves: Cranial nerves 2-12 are intact.     Sensory: Sensation is intact.     Motor: Motor function is intact.     Coordination: Coordination is intact.        Results for orders placed or performed during the hospital encounter of 09/30/23  MR BRAIN W WO CONTRAST   Narrative   EXAM: MRI BRAIN WITH AND  WITHOUT CONTRAST 09/30/2023 10:09:11 AM  TECHNIQUE: Multiplanar multisequence MRI of the head/brain was performed with and without the administration of intravenous contrast.  COMPARISON: CT angio head and neck 08/29/2023.  CLINICAL HISTORY: Persistent HA localized to lt temple, tenderness, and nausea. Lt eye pain; Duration: 1.5 months. Covid related HA. Pain increases with loud noises and bright lights; NKI. No brain/head sx. Squamous cell carcinoma hx.  FINDINGS:  BRAIN AND VENTRICLES: No acute infarct. No acute intracranial hemorrhage. No mass effect or midline shift. No hydrocephalus. The sella is unremarkable. Normal flow voids. No other focal lesions are present. Minimal atrophy and white matter changes are within normal limits for age.  ORBITS: Bilateral lens replacements are noted. The globes and orbits are otherwise within normal limits.  SINUSES: No acute abnormality.  BONES AND SOFT TISSUES: Focal expansion of the inner table of the left frontal calvarium demonstrates subtle enhancement without any definite restricted diffusion. This may represent an intraosseous meningioma.  IMPRESSION: 1. No acute intracranial abnormality. 2. Focal expansion of the inner table of the left frontal calvarium with subtle enhancement, possibly representing an intraosseous meningioma. No definite restricted diffusion.  Electronically signed by: Lonni Necessary MD 09/30/2023 11:06 AM EDT RP Workstation: [id number]   Results for orders placed or performed during the hospital encounter of 01/22/22  CT Head Wo Contrast   Narrative   CLINICAL DATA:  Bulla polar trauma. Tripped over a bag at home, fall onto floor.  EXAM: CT HEAD WITHOUT CONTRAST  TECHNIQUE: Contiguous axial images were obtained from the base of the skull through the vertex without intravenous contrast.  RADIATION DOSE REDUCTION: This exam was performed according to the departmental dose-optimization  program which includes automated exposure control, adjustment of the mA and/or kV according to patient size and/or use of iterative reconstruction technique.  COMPARISON:  None Available.  FINDINGS: Brain: No intracranial hemorrhage, mass effect, or midline shift. No hydrocephalus. The basilar cisterns are patent. No evidence of territorial infarct or acute ischemia. No extra-axial or intracranial fluid collection. Mildly enlarged partially empty sella.  Vascular: Atherosclerosis of skullbase vasculature without hyperdense vessel or abnormal calcification.  Skull: No skull fracture. Rounded excrescence in the inner table of the left frontal bone has no significant mass effect or surrounding edema, of doubtful clinical significance.  Sinuses/Orbits: Assessed on concurrent face CT, reported separately.  Other: None.  IMPRESSION: No acute intracranial abnormality. No skull fracture.   Electronically Signed   By: Andrea Gasman M.D.   On: 01/21/2022 20:07

## 2023-11-02 DIAGNOSIS — Z049 Encounter for examination and observation for unspecified reason: Secondary | ICD-10-CM | POA: Diagnosis not present

## 2023-11-02 DIAGNOSIS — R519 Headache, unspecified: Secondary | ICD-10-CM | POA: Diagnosis not present

## 2023-11-02 DIAGNOSIS — Z79899 Other long term (current) drug therapy: Secondary | ICD-10-CM | POA: Diagnosis not present

## 2023-11-19 DIAGNOSIS — R0602 Shortness of breath: Secondary | ICD-10-CM | POA: Diagnosis not present

## 2023-11-19 DIAGNOSIS — U099 Post covid-19 condition, unspecified: Secondary | ICD-10-CM | POA: Diagnosis not present

## 2023-11-19 DIAGNOSIS — K200 Eosinophilic esophagitis: Secondary | ICD-10-CM | POA: Diagnosis not present

## 2023-11-19 DIAGNOSIS — H6693 Otitis media, unspecified, bilateral: Secondary | ICD-10-CM | POA: Diagnosis not present

## 2023-12-05 DIAGNOSIS — H6090 Unspecified otitis externa, unspecified ear: Secondary | ICD-10-CM | POA: Diagnosis not present

## 2023-12-05 DIAGNOSIS — J019 Acute sinusitis, unspecified: Secondary | ICD-10-CM | POA: Diagnosis not present

## 2023-12-07 DIAGNOSIS — M79604 Pain in right leg: Secondary | ICD-10-CM | POA: Diagnosis not present

## 2023-12-07 DIAGNOSIS — J0100 Acute maxillary sinusitis, unspecified: Secondary | ICD-10-CM | POA: Diagnosis not present

## 2023-12-07 DIAGNOSIS — M25572 Pain in left ankle and joints of left foot: Secondary | ICD-10-CM | POA: Diagnosis not present

## 2023-12-07 DIAGNOSIS — M25571 Pain in right ankle and joints of right foot: Secondary | ICD-10-CM | POA: Diagnosis not present

## 2023-12-10 ENCOUNTER — Other Ambulatory Visit (HOSPITAL_COMMUNITY): Payer: Self-pay | Admitting: Family Medicine

## 2023-12-10 ENCOUNTER — Ambulatory Visit (HOSPITAL_COMMUNITY)
Admission: RE | Admit: 2023-12-10 | Discharge: 2023-12-10 | Disposition: A | Discharge status: Home / Self Care | Source: Ambulatory Visit | Attending: Vascular Surgery | Admitting: Vascular Surgery

## 2023-12-10 DIAGNOSIS — R609 Edema, unspecified: Secondary | ICD-10-CM

## 2024-01-04 DIAGNOSIS — M25572 Pain in left ankle and joints of left foot: Secondary | ICD-10-CM | POA: Diagnosis not present

## 2024-01-04 DIAGNOSIS — M25571 Pain in right ankle and joints of right foot: Secondary | ICD-10-CM | POA: Diagnosis not present

## 2024-01-04 DIAGNOSIS — G8929 Other chronic pain: Secondary | ICD-10-CM | POA: Diagnosis not present

## 2024-01-04 DIAGNOSIS — T7840XD Allergy, unspecified, subsequent encounter: Secondary | ICD-10-CM | POA: Diagnosis not present

## 2024-01-04 DIAGNOSIS — L309 Dermatitis, unspecified: Secondary | ICD-10-CM | POA: Diagnosis not present

## 2024-01-04 DIAGNOSIS — M25561 Pain in right knee: Secondary | ICD-10-CM | POA: Diagnosis not present

## 2024-01-31 ENCOUNTER — Other Ambulatory Visit: Payer: Self-pay

## 2024-01-31 ENCOUNTER — Telehealth: Payer: Self-pay | Admitting: Internal Medicine

## 2024-01-31 DIAGNOSIS — Z1211 Encounter for screening for malignant neoplasm of colon: Secondary | ICD-10-CM

## 2024-01-31 NOTE — Telephone Encounter (Signed)
 Inbound call from patient stating that does not want to have a colonoscopy and would like to know weather there is another from of checking for colon cancer. Patient is requesting a call back. Please advise.

## 2024-01-31 NOTE — Telephone Encounter (Signed)
 Dr Albertus-  Please advise. Patient last seen 01/2023. Office note states annual FIT for colorectal cancer screening. Consider colonoscopy in 2026.  Last colonoscopy was completed 05/2014 with plans for a 10 year recall.   Patient calls stating she doesn't want a colonoscopy and wants to know about other screening options.  Does she need to come in and discuss this?

## 2024-01-31 NOTE — Telephone Encounter (Signed)
 Pt states she does not want to have another colonoscopy. Would pt be a candidate for cologuard?

## 2024-01-31 NOTE — Telephone Encounter (Signed)
 Spoke with pt and she is aware, order placed for cologuard. She knows to expect a kit in the mail.

## 2024-01-31 NOTE — Telephone Encounter (Signed)
 Cologuard is appropriate JMP

## 2024-02-15 ENCOUNTER — Ambulatory Visit: Payer: Self-pay | Admitting: Internal Medicine

## 2024-02-15 LAB — COLOGUARD: COLOGUARD: NEGATIVE
